# Patient Record
Sex: Male | Born: 1945
Health system: Southern US, Community
[De-identification: ages and names within clinical notes are randomized; demographics above are authoritative.]

## PROBLEM LIST (undated history)

## (undated) DIAGNOSIS — L409 Psoriasis, unspecified: Secondary | ICD-10-CM

## (undated) DIAGNOSIS — J449 Chronic obstructive pulmonary disease, unspecified: Secondary | ICD-10-CM

## (undated) DIAGNOSIS — E785 Hyperlipidemia, unspecified: Secondary | ICD-10-CM

## (undated) DIAGNOSIS — R0609 Other forms of dyspnea: Secondary | ICD-10-CM

## (undated) DIAGNOSIS — E119 Type 2 diabetes mellitus without complications: Secondary | ICD-10-CM

## (undated) DIAGNOSIS — I509 Heart failure, unspecified: Secondary | ICD-10-CM

## (undated) DIAGNOSIS — K219 Gastro-esophageal reflux disease without esophagitis: Secondary | ICD-10-CM

## (undated) DIAGNOSIS — I447 Left bundle-branch block, unspecified: Secondary | ICD-10-CM

## (undated) DIAGNOSIS — I219 Acute myocardial infarction, unspecified: Secondary | ICD-10-CM

## (undated) DIAGNOSIS — I10 Essential (primary) hypertension: Secondary | ICD-10-CM

## (undated) DIAGNOSIS — R0600 Dyspnea, unspecified: Secondary | ICD-10-CM

## (undated) DIAGNOSIS — I4719 Other supraventricular tachycardia: Secondary | ICD-10-CM

## (undated) DIAGNOSIS — I82409 Acute embolism and thrombosis of unspecified deep veins of unspecified lower extremity: Secondary | ICD-10-CM

## (undated) DIAGNOSIS — I2510 Atherosclerotic heart disease of native coronary artery without angina pectoris: Secondary | ICD-10-CM

## (undated) DIAGNOSIS — I471 Supraventricular tachycardia: Secondary | ICD-10-CM

## (undated) DIAGNOSIS — G4730 Sleep apnea, unspecified: Secondary | ICD-10-CM

## (undated) DIAGNOSIS — I442 Atrioventricular block, complete: Secondary | ICD-10-CM

## (undated) DIAGNOSIS — I429 Cardiomyopathy, unspecified: Secondary | ICD-10-CM

## (undated) HISTORY — PX: PACEMAKER INSERTION: SHX728

## (undated) HISTORY — DX: Cardiomyopathy, unspecified: I42.9

## (undated) HISTORY — PX: REFRACTIVE SURGERY: SHX103

## (undated) HISTORY — DX: Other supraventricular tachycardia: I47.19

## (undated) HISTORY — PX: CATARACT EXTRACTION: SUR2

## (undated) HISTORY — DX: Dyspnea, unspecified: R06.00

## (undated) HISTORY — PX: CORONARY ANGIOPLASTY WITH STENT PLACEMENT: SHX49

## (undated) HISTORY — DX: Other forms of dyspnea: R06.09

## (undated) HISTORY — DX: Left bundle-branch block, unspecified: I44.7

## (undated) HISTORY — DX: Hyperlipidemia, unspecified: E78.5

## (undated) HISTORY — DX: Atrioventricular block, complete: I44.2

## (undated) HISTORY — DX: Supraventricular tachycardia: I47.1

---

## 1999-07-03 ENCOUNTER — Ambulatory Visit (HOSPITAL_COMMUNITY): Admission: RE | Admit: 1999-07-03 | Discharge: 1999-07-03 | Payer: Self-pay | Admitting: Cardiology

## 2005-05-25 ENCOUNTER — Inpatient Hospital Stay (HOSPITAL_COMMUNITY): Admission: EM | Admit: 2005-05-25 | Discharge: 2005-05-28 | Payer: Self-pay | Admitting: *Deleted

## 2005-06-23 ENCOUNTER — Ambulatory Visit: Payer: Self-pay | Admitting: Cardiovascular Disease

## 2005-06-23 ENCOUNTER — Inpatient Hospital Stay (HOSPITAL_COMMUNITY): Admission: EM | Admit: 2005-06-23 | Discharge: 2005-06-25 | Payer: Self-pay | Admitting: Emergency Medicine

## 2005-07-21 ENCOUNTER — Encounter: Admission: RE | Admit: 2005-07-21 | Discharge: 2005-07-21 | Payer: Self-pay | Admitting: *Deleted

## 2005-08-13 ENCOUNTER — Ambulatory Visit: Payer: Self-pay | Admitting: Cardiology

## 2005-10-21 ENCOUNTER — Inpatient Hospital Stay (HOSPITAL_COMMUNITY): Admission: EM | Admit: 2005-10-21 | Discharge: 2005-10-24 | Payer: Self-pay | Admitting: Emergency Medicine

## 2005-10-26 HISTORY — PX: CORONARY ARTERY BYPASS GRAFT: SHX141

## 2005-10-29 ENCOUNTER — Ambulatory Visit (HOSPITAL_COMMUNITY): Admission: RE | Admit: 2005-10-29 | Discharge: 2005-10-29 | Payer: Self-pay | Admitting: *Deleted

## 2005-11-19 ENCOUNTER — Ambulatory Visit: Payer: Self-pay | Admitting: Cardiology

## 2005-12-20 ENCOUNTER — Inpatient Hospital Stay (HOSPITAL_COMMUNITY): Admission: EM | Admit: 2005-12-20 | Discharge: 2005-12-28 | Payer: Self-pay | Admitting: Emergency Medicine

## 2005-12-24 ENCOUNTER — Ambulatory Visit: Payer: Self-pay | Admitting: Infectious Diseases

## 2005-12-24 DIAGNOSIS — I82409 Acute embolism and thrombosis of unspecified deep veins of unspecified lower extremity: Secondary | ICD-10-CM

## 2005-12-24 HISTORY — DX: Acute embolism and thrombosis of unspecified deep veins of unspecified lower extremity: I82.409

## 2006-01-08 ENCOUNTER — Ambulatory Visit (HOSPITAL_COMMUNITY): Admission: RE | Admit: 2006-01-08 | Discharge: 2006-01-08 | Payer: Self-pay | Admitting: Orthopedic Surgery

## 2006-02-19 ENCOUNTER — Ambulatory Visit: Payer: Self-pay | Admitting: Cardiology

## 2006-02-26 ENCOUNTER — Encounter (HOSPITAL_COMMUNITY): Admission: RE | Admit: 2006-02-26 | Discharge: 2006-05-27 | Payer: Self-pay | Admitting: *Deleted

## 2006-04-16 ENCOUNTER — Inpatient Hospital Stay (HOSPITAL_COMMUNITY): Admission: EM | Admit: 2006-04-16 | Discharge: 2006-04-20 | Payer: Self-pay | Admitting: Emergency Medicine

## 2006-04-29 ENCOUNTER — Ambulatory Visit: Payer: Self-pay | Admitting: Internal Medicine

## 2006-04-29 ENCOUNTER — Ambulatory Visit (HOSPITAL_BASED_OUTPATIENT_CLINIC_OR_DEPARTMENT_OTHER): Admission: RE | Admit: 2006-04-29 | Discharge: 2006-04-29 | Payer: Self-pay | Admitting: Internal Medicine

## 2006-05-09 ENCOUNTER — Ambulatory Visit: Payer: Self-pay | Admitting: Internal Medicine

## 2006-05-20 ENCOUNTER — Ambulatory Visit: Payer: Self-pay | Admitting: Internal Medicine

## 2006-06-21 ENCOUNTER — Ambulatory Visit: Payer: Self-pay | Admitting: Internal Medicine

## 2006-07-05 ENCOUNTER — Inpatient Hospital Stay (HOSPITAL_COMMUNITY): Admission: EM | Admit: 2006-07-05 | Discharge: 2006-07-13 | Payer: Self-pay | Admitting: Emergency Medicine

## 2006-07-06 ENCOUNTER — Encounter: Payer: Self-pay | Admitting: Vascular Surgery

## 2006-07-07 ENCOUNTER — Encounter: Payer: Self-pay | Admitting: Vascular Surgery

## 2006-08-18 ENCOUNTER — Inpatient Hospital Stay (HOSPITAL_COMMUNITY): Admission: AD | Admit: 2006-08-18 | Discharge: 2006-08-20 | Payer: Self-pay | Admitting: Cardiology

## 2006-08-31 ENCOUNTER — Encounter: Admission: RE | Admit: 2006-08-31 | Discharge: 2006-08-31 | Payer: Self-pay | Admitting: Surgery

## 2008-07-27 ENCOUNTER — Encounter: Payer: Self-pay | Admitting: Internal Medicine

## 2008-08-10 ENCOUNTER — Encounter: Payer: Self-pay | Admitting: Internal Medicine

## 2008-08-24 DIAGNOSIS — G4733 Obstructive sleep apnea (adult) (pediatric): Secondary | ICD-10-CM

## 2008-08-24 DIAGNOSIS — R0989 Other specified symptoms and signs involving the circulatory and respiratory systems: Secondary | ICD-10-CM

## 2008-08-24 DIAGNOSIS — E119 Type 2 diabetes mellitus without complications: Secondary | ICD-10-CM | POA: Insufficient documentation

## 2008-08-24 DIAGNOSIS — R0609 Other forms of dyspnea: Secondary | ICD-10-CM | POA: Insufficient documentation

## 2008-08-24 DIAGNOSIS — J449 Chronic obstructive pulmonary disease, unspecified: Secondary | ICD-10-CM

## 2008-08-27 ENCOUNTER — Ambulatory Visit: Payer: Self-pay | Admitting: Internal Medicine

## 2008-08-30 ENCOUNTER — Ambulatory Visit: Payer: Self-pay | Admitting: Internal Medicine

## 2009-01-01 ENCOUNTER — Encounter: Payer: Self-pay | Admitting: Internal Medicine

## 2009-09-16 ENCOUNTER — Encounter: Payer: Self-pay | Admitting: Internal Medicine

## 2009-10-02 ENCOUNTER — Encounter: Payer: Self-pay | Admitting: Internal Medicine

## 2009-10-02 HISTORY — PX: NM MYOCAR PERF WALL MOTION: HXRAD629

## 2009-12-02 ENCOUNTER — Inpatient Hospital Stay (HOSPITAL_COMMUNITY): Admission: EM | Admit: 2009-12-02 | Discharge: 2009-12-04 | Payer: Self-pay | Admitting: Emergency Medicine

## 2009-12-11 ENCOUNTER — Encounter: Admission: RE | Admit: 2009-12-11 | Discharge: 2009-12-11 | Payer: Self-pay | Admitting: Cardiovascular Disease

## 2010-01-03 ENCOUNTER — Encounter: Payer: Self-pay | Admitting: Internal Medicine

## 2010-01-10 ENCOUNTER — Encounter: Payer: Self-pay | Admitting: Internal Medicine

## 2010-01-10 HISTORY — PX: US ECHOCARDIOGRAPHY: HXRAD669

## 2010-04-09 ENCOUNTER — Encounter: Payer: Self-pay | Admitting: Internal Medicine

## 2010-05-27 ENCOUNTER — Telehealth: Payer: Self-pay | Admitting: Internal Medicine

## 2010-07-01 ENCOUNTER — Encounter: Payer: Self-pay | Admitting: Internal Medicine

## 2010-11-16 ENCOUNTER — Encounter: Payer: Self-pay | Admitting: Cardiology

## 2010-11-16 ENCOUNTER — Encounter: Payer: Self-pay | Admitting: *Deleted

## 2010-11-25 NOTE — Letter (Signed)
Summary: LMN Update / [facility]  LMN Update / [facility]   Imported By: Lennie Odor 05/29/2010 12:26:32  _____________________________________________________________________  External Attachment:    Type:   Image     Comment:   External Document

## 2010-11-25 NOTE — Progress Notes (Signed)
Summary: Patient D/C'd CPAP  Phone Note Other Incoming   Summary of Call: Christoper Allegra reports patient is not using CPAP now and refused to autotitrate. I will have it dc'd. Initial call taken by: Waymon Budge MD,  May 27, 2010 10:07 PM

## 2010-11-25 NOTE — Letter (Signed)
Summary: [facility]  [facility]   Imported By: Sherian Rein 01/27/2010 12:14:44  _____________________________________________________________________  External Attachment:    Type:   Image     Comment:   External Document

## 2010-11-25 NOTE — Letter (Signed)
Summary: [facility]  [facility]   Imported By: Sherian Rein 08/01/2010 14:12:20  _____________________________________________________________________  External Attachment:    Type:   Image     Comment:   External Document

## 2010-11-25 NOTE — Letter (Signed)
Summary: LMN/[facility]  LMN/[facility]   Imported By: Lester Copemish 04/15/2010 08:50:53  _____________________________________________________________________  External Attachment:    Type:   Image     Comment:   External Document

## 2011-01-14 LAB — DIFFERENTIAL
Basophils Absolute: 0 10*3/uL (ref 0.0–0.1)
Basophils Relative: 0 % (ref 0–1)
Eosinophils Absolute: 0.2 10*3/uL (ref 0.0–0.7)
Eosinophils Relative: 2 % (ref 0–5)
Lymphocytes Relative: 16 % (ref 12–46)
Lymphs Abs: 1.4 10*3/uL (ref 0.7–4.0)
Monocytes Absolute: 0.8 10*3/uL (ref 0.1–1.0)
Neutro Abs: 6.8 10*3/uL (ref 1.7–7.7)
Neutrophils Relative %: 73 % (ref 43–77)

## 2011-01-14 LAB — CBC
HCT: 37.5 % — ABNORMAL LOW (ref 39.0–52.0)
HCT: 40.2 % (ref 39.0–52.0)
HCT: 44.3 % (ref 39.0–52.0)
Hemoglobin: 13 g/dL (ref 13.0–17.0)
Hemoglobin: 13.9 g/dL (ref 13.0–17.0)
Hemoglobin: 15.3 g/dL (ref 13.0–17.0)
MCHC: 34.7 g/dL (ref 30.0–36.0)
MCV: 94.7 fL (ref 78.0–100.0)
MCV: 95.6 fL (ref 78.0–100.0)
Platelets: 307 10*3/uL (ref 150–400)
Platelets: 332 10*3/uL (ref 150–400)
Platelets: 340 10*3/uL (ref 150–400)
RBC: 4.24 MIL/uL (ref 4.22–5.81)
RDW: 13 % (ref 11.5–15.5)
RDW: 13.2 % (ref 11.5–15.5)
WBC: 8.9 10*3/uL (ref 4.0–10.5)

## 2011-01-14 LAB — MAGNESIUM: Magnesium: 1.5 mg/dL (ref 1.5–2.5)

## 2011-01-14 LAB — LIPID PANEL
Cholesterol: 125 mg/dL (ref 0–200)
LDL Cholesterol: 69 mg/dL (ref 0–99)
Total CHOL/HDL Ratio: 3.5 RATIO
Triglycerides: 100 mg/dL (ref <150)

## 2011-01-14 LAB — CARDIAC PANEL(CRET KIN+CKTOT+MB+TROPI)
CK, MB: 0.5 ng/mL (ref 0.3–4.0)
CK, MB: 0.6 ng/mL (ref 0.3–4.0)
Total CK: 45 U/L (ref 7–232)

## 2011-01-14 LAB — COMPREHENSIVE METABOLIC PANEL
ALT: 31 U/L (ref 0–53)
Albumin: 3.7 g/dL (ref 3.5–5.2)
BUN: 10 mg/dL (ref 6–23)
Calcium: 9.2 mg/dL (ref 8.4–10.5)
Creatinine, Ser: 0.7 mg/dL (ref 0.4–1.5)
Glucose, Bld: 226 mg/dL — ABNORMAL HIGH (ref 70–99)
Total Protein: 7.4 g/dL (ref 6.0–8.3)

## 2011-01-14 LAB — BASIC METABOLIC PANEL
Chloride: 103 mEq/L (ref 96–112)
Creatinine, Ser: 0.85 mg/dL (ref 0.4–1.5)
GFR calc Af Amer: >60 mL/min (ref >60)
Potassium: 3.7 mEq/L (ref 3.5–5.1)
Sodium: 138 mEq/L (ref 135–145)

## 2011-01-14 LAB — CK TOTAL AND CKMB (NOT AT ARMC)
CK, MB: 0.6 ng/mL (ref 0.3–4.0)
Relative Index: INVALID (ref 0.0–2.5)
Total CK: 47 U/L (ref 7–232)

## 2011-01-14 LAB — APTT: aPTT: 27 seconds (ref 24–37)

## 2011-01-14 LAB — PROTIME-INR
INR: 0.98 (ref 0.00–1.49)
Prothrombin Time: 12.9 seconds (ref 11.6–15.2)

## 2011-01-14 LAB — GLUCOSE, CAPILLARY: Glucose-Capillary: 157 mg/dL — ABNORMAL HIGH (ref 70–99)

## 2011-01-14 LAB — HEMOGLOBIN A1C: Mean Plasma Glucose: 192 mg/dL

## 2011-01-14 LAB — HEPARIN LEVEL (UNFRACTIONATED): Heparin Unfractionated: 0.22 IU/mL — ABNORMAL LOW (ref 0.30–0.70)

## 2011-01-14 LAB — TSH: TSH: 0.561 u[IU]/mL (ref 0.350–4.500)

## 2011-03-13 NOTE — Discharge Summary (Signed)
NAMECARMELO, REIDEL NO.:  [id number]   MEDICAL RECORD NO.:  [id number]          PATIENT TYPE:  INP   LOCATION:  2038                         FACILITY:  [facility]   PHYSICIAN:  Madaline Savage, M.D.DATE OF BIRTH:  1946-10-12   DATE OF ADMISSION:  08/18/2006  DATE OF DISCHARGE:  08/20/2006                               DISCHARGE SUMMARY   DISCHARGE DIAGNOSES:  1. Paroxysmal atrial flutter with symptomatic chest pain and shortness      of breath.      a.     Now in sinus rhythm at discharge.  2. Myocardial infarction.  3. History of coronary artery bypass grafting, July 09, 2006.  4. Anticoagulation, Lovenox/Coumadin crossover on discharge.  5. Diabetes mellitus, type 2.  6. Chronic obstructive pulmonary disease.  7. History of hematoma to his legs, stable.  8. Hyperlipidemia.  9. Tachycardia with atrial flutter, resolved.  10.Ascending thoracic aortic aneurysm.   DISCHARGE CONDITION:  Improved.   PROCEDURES:  None.   DISCHARGE MEDICATIONS:  1. Lovenox 80 mg every 12 hours.  2. Coumadin 7.5 mg October 20, 2006.  Saturday 5 mg, Sunday 7.5 mg,      and Monday 5 mg.  3. Cardizem CD 120 mg daily.  4. Enteric-coated aspirin 81 mg daily.  5. Toprol XL 100 daily.  6. Benazepril 5 mg 1 daily.  7. Lipitor 40 mg daily.  8. Glucophage 1 twice a day.  Do not begin until August 21, 2006.  9. Protonix 40 mg daily.  10.Oxycodone 5 mg as before.   DISCHARGE INSTRUCTIONS:  1. Low-fat, low-salt diabetic diet.  2. Increase activity slowly.  3. Stop Plavix.  4. Have lab work done on Monday and call our office if he has not      heard from [place] by 3 p.m.  5. Follow up with Dr. Jenne Campus at September 06, 2006, at 4 p.m.   HISTORY OF PRESENT ILLNESS:  A 65 year old white married male with  history of coronary disease and multiple stents, last stent being in  August of 2006.  Then, 3 caths with cutting balloon angioplasty in  December of 2006 and again in June of  2007.  His last cath was September  27th with increased coronary disease not amenable to angioplasty.  He  underwent bypass grafting x3 with sequential saphenous vein graft to the  first and second OM and circ and saphenous vein graft to the PLA at the  circ.  The patient had called our office on August 18, 2006, with  ongoing chest pain different than his incisional pain.  His pain  medication had not helped.  He took two nitro 5 minutes apart, which  relieved most of his discomfort.  He came into the office and was found  to be in A-flutter with heart rate of 125.  No other acute EKG changes.  We admitted him to [facility] on Heparin and Coumadin and placed him  on Cardizem IV initially.  Plavix was discontinued with the addition of  Coumadin.   PAST MEDICAL HISTORY:  1. Cardiac,  as stated.  2. Hypertension.  3. Hyperlipidemia.  4. COPD.  5. Diabetes mellitus, type 2.  6. History of hematoma to his leg.  7. History of Baker cyst.   ALLERGIES:  NO KNOWN ALLERGIES.   OUTPATIENT MEDICATIONS:  1. Aspirin 325 mg.  2. Toprol 100.  3. Benazepril 5.  4. Lipitor 40.  5. Glucophage 500 b.i.d.  6. Plavix 75 daily.  7. Protonix 40.  8. Oxycodone p.r.n.   FAMILY HISTORY:  See H&P.   SOCIAL HISTORY:  See H&P.   REVIEW OF SYSTEMS:  See H&P.   PHYSICAL EXAMINATION AT DISCHARGE:  Blood pressure 103/67, pulse 94,  respirations 20, temperature 97.4, oxygen saturation on room air 93%.  HEART:  Regular rate and rhythm.  Lungs were clear.  ABDOMEN:  Soft, nontender.  Positive bowel sounds.  EXTREMITIES:  Without edema.   LABORATORY DATA:  Hemoglobin 13, hematocrit 38, WBC 8.8, platelets 450;  these remained stable.  Neutrophils 63, lymphs of 24, monos 9, eos 3,  basos 0.  Pro time 13.3, INR of 1, PTT 28.  D-dimer is 0.34.   At discharge, pro time 16, INR of 1.2, heparin 0.37.   CHEMISTRY:  Sodium 141, potassium 4, chloride 105, CO2 of 27, glucose  112, BUN 14, creatinine 0.8,  calcium 9.6, total protein 6.9, albumin  3.9, AST 23, ALT 27, alkaline phosphatase 77, total bilirubin of 0.7,  magnesium 1.8.   Glyco hemoglobin 5.7.   CK 47 and 41, MB 0.9 and 1.0, and Troponin I is 0.10 x2.   TSH 1.23, blood cultures no growth for 5 days.   EKG:  Sinus rhythm with a left bundle branch block.  EKG from the  office:  He was in A-flutter 2:1 block.  No acute changes from previous  tracing and followup EKG converted to sinus rhythm.  Sinus rhythm and  left bundle branch block on October 25th.   CT of the chest for rule out PE was negative for pulmonary embolism.  Minimal scattered peripheral densities with a tree and bud appearance  which may represent very mild alveolitis, mild pericardial effusion, 4.5-  cm ascending thoracic aortic aneurysm.   HOSPITAL COURSE:  Mr. Groninger was admitted from the office secondary to  chest pain and 2:1 atrial flutter, admitted to telemetry and he was  placed on IV heparin and IV Cardizem.  Labs were evaluated, and then he  was started on Coumadin.  Plavix was discontinued.  He continued to  improve in the hospital and converted to sinus rhythm.  By October 26th,  he was stable and ready for discharge home.  He was discharged on subcu  Lovenox as an outpatient and Coumadin.      Darcella Gasman. Annie Paras, N.P.    ______________________________  Madaline Savage, M.D.    LRI/MEDQ  D:  10/01/2006  T:  10/02/2006  Job:  [id number]   cc:   Osvaldo Shipper. Spruill, M.D.  Darlin Priestly, MD

## 2011-03-13 NOTE — Cardiovascular Report (Signed)
NAMEJAKYLE, PETRUCELLI NO.:  [id number]   MEDICAL RECORD NO.:  [id number]          PATIENT TYPE:  INP   LOCATION:  2006                         FACILITY:  [facility]   PHYSICIAN:  Darlin Priestly, MD  DATE OF BIRTH:  1946/09/05   DATE OF PROCEDURE:  07/07/2006  DATE OF DISCHARGE:                              CARDIAC CATHETERIZATION   PROCEDURES:  1. Left heart catheterization.  2. Coronary angiography.  3. Left ventriculogram.   ATTENDING PHYSICIAN:  Darlin Priestly, M.D.   COMPLICATIONS:  None.   INDICATIONS:  Mr. Carstens is a 65 year old male, a patient of mine, with a  history of acute lateral wall MI, May 25, 2005, with subsequent placement  of five CYPHER stents, two in the first OM, two in the second OM, and one in  the distal AV groove circumflex.  He did have in-stent restenosis of the  ostial portion of the first OM with subsequent placement of a sixth CYPHER  stent by Dr. Daphene Jaeger on June 24, 2005.  He has had intermittent episodes  of recurrent chest pain with recurrent episodes of in-stent restenosis of  the first OM and AV groove circumflex.  He is now readmitted again with  unstable angina and scheduled to undergo coronary bypass surgery.  He is now  brought for re-look at his coronaries to see if LAD grafting is required.   DESCRIPTION OF PROCEDURE:  After obtaining informed written consent, the  patient was brought to the cardiac cath lab.  Right and left groins were  shaved, prepped and draped in the usual sterile fashion.  ECG monitor  established.  Using modified Seldinger technique a #6-French arterial sheath  inserted into the right femoral artery.  A 6-French diagnostic catheter was  then used to perform diagnostic angiography.   1. The left main is a large vessel with no significant disease.  2. The LAD is a medium to large vessel which coursed over the apex with      one diagonal branch.  The LAD has mild 30-40% proximal mid vessel      narrowing but no high grade stenosis.  3. The first diagonal is a small vessel with no significant disease.  4. The left circumflex is a large vessel which is dominant.  It gives rise      to three obtuse marginal branches as well as a PDA.  The AV groove      circumflex is noted to have 70% mid vessel lesion between the first and      second OMs.  There is a stent noted in the distal AV groove circumflex      which appears to be widely patent.  5. The first OM is a large vessel with overlapping stents in its proximal      segment.  __________  ostial stenosis.  There is a 30% distal stenosis      after the distal aspect of the stent.  6. The second OM is a medium to large vessel with overlapping stents in      its  mid segment with 40% proximal narrowing.  7. The PDA is a medium sized vessel with no significant disease.  8. The right coronary artery is a small nondominant vessel with 95% mid      vessel lesion.   Hand injection of the LV reveals an estimated EF of approximately 60%.   HEMODYNAMICS:  1. Systemic arterial pressure 120/64.  2. LV systemic pressure 120/80, LVEDP of 15.   CONCLUSION:  1. Significant 2-vessel coronary artery disease.  2. Normal left ventricular systolic function.      Darlin Priestly, MD  Electronically Signed     RHM/MEDQ  D:  07/07/2006  T:  07/07/2006  Job:  [id number]

## 2011-03-13 NOTE — Discharge Summary (Signed)
[name], [name] NO.:  [id number]   MEDICAL RECORD NO.:  [id number]          PATIENT TYPE:  INP   LOCATION:  6524                         FACILITY:  [facility]   PHYSICIAN:  Darlin Priestly, MD  DATE OF BIRTH:  03-04-1946   DATE OF ADMISSION:  10/21/2005  DATE OF DISCHARGE:                                 DISCHARGE SUMMARY   DISCHARGE DIAGNOSES:  1.  Subendocardial myocardial infarction this admission by troponin.  2.  Coronary disease, in-stent restenosis of the obtuse marginal-2 treated      with cutting balloon this admission.  3.  Coronary disease with a history of subendocardial myocardial infarction      in July 2006 treated with five stents; two to the obtuse marginal-1, two      to the obtuse marginal-2 and intervention to the atrioventricular      branch. Restenosis of the obtuse marginal-1 treated with percutaneous      coronary intervention in August 2006.  4.  Left bundle branch block.  5.  Hypertension.  6.  Hyperlipidemia.  7.  Syncope on admission, secondary to hypotension from nitroglycerin.  8.  Dyslipidemia.   HOSPITAL COURSE:  The patient is a 65 year old male followed by Dr. Jenne Campus  with history of coronary disease. In July 2006, he had an SEMI and underwent  two Cypher stents to the OM-2, two Cypher stents to the OM-1 and an AV  branch Cypher stent. The OM-1 was again intervened on by Dr. Tresa Endo August  2006 after an abnormal Cardiolite study. The patient has continue to have  chest pain off and on and his medications have been increased as an  outpatient. On the day of admission, he had taken two nitroglycerin for  chest pain and got into a car. He became nauseated, diaphoretic and blacked  out. He was seen in emergency room. He was admitted from the ER and given  one dose of Lovenox and set up for catheterization the next day. We backed  off on some of his medications that had previously been increased for  angina. His troponins were  positive at 0.15. He underwent catheterization on  October 22, 2005 by Dr. Jenne Campus. This revealed a 95% mid small nondominant  RCA, normal left main, normal LAD with a 40% mid-narrowing, patent OM-1  stent sites with a 50% proximal narrowing and 70% stenosis in the distal OM-  2 stent. The AV branch was patent. The patient's EF was 40-45%. The patient  underwent OM-2 cutting balloon for in-stent restenosis. His troponins went  to 0.25. His medications have been adjusted and Ranexa has been added. He  will be discharged later today pending follow-up troponin. We did hold off  on some of his medications including his Norvasc because of hypotension.   DISCHARGE MEDICATIONS:  1.  Lipitor 20 milligrams a day.  2.  Ranexa 500 milligrams a day.  3.  Coated aspirin daily.  4.  Toprol XL 100 milligrams a day.  5.  Aldactone 12.5 milligrams a day.  6.  Imdur 60 milligrams a day.  7.  Triton study drug as directed.  8.  Nitroglycerin sublingual p.r.n.  9.  Protonix 40 milligrams a day.  10. Plavix 75 milligrams a day.   LABS:  White count 11.5, hemoglobin 13.1, hematocrit 36.6, platelets  347,000. Sodium 137, potassium 2.9, BUN 11, creatinine 0.9. ALT is slightly  elevated at 62, AST is normal at 34. CKs were negative and troponin the  evening after his intervention was 0.25. CK-MBs were negative x3. TSH 1.08.  Digoxin level 0.5. D-dimer was ordered on the 27th but there were no  results. Chest x-ray shows no acute process. UA showed some proteinuria,  otherwise unremarkable. Coags were normal. His INR is 1.0.   DISPOSITION:  The patient is discharged in stable condition and will follow-  up with Dr. Jenne Campus. We had to change his Zocor to Lipitor because of the  addition of Ranexa which cannot be taken with Zocor. Dr. Domingo Sep brought up  that the patient may be considered for EECP as an outpatient.      Abelino Derrick, P.A.      Darlin Priestly, MD  Electronically Signed     LKK/MEDQ  D:  10/23/2005  T:  10/23/2005  Job:  [id number]   cc:   Darlin Priestly, MD  Fax: [phone]
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
## 2011-03-13 NOTE — Op Note (Signed)
[name], [name] NO.:  [id number]   MEDICAL RECORD NO.:  [id number]          PATIENT TYPE:  INP   LOCATION:  2899                         FACILITY:  [facility]   PHYSICIAN:  Harvie Junior, M.D.   DATE OF BIRTH:  03-13-1946   DATE OF PROCEDURE:  01/08/2006  DATE OF DISCHARGE:  01/08/2006                                 OPERATIVE REPORT   A 65 year old male in Orthopedic Surgery service.   DATE OF SURGERY:  January 08, 2006.   DATE OF DICTATION:  January 08, 2006.   PREOPERATIVE DIAGNOSIS:  Complex organized hematoma posterior calf, right.   POSTOPERATIVE DIAGNOSIS:  Complex organized hematoma posterior calf, right.   PRINCIPAL PROCEDURE:  Irrigation and debridement of complex organized  hematoma with debridement of skin, muscle, fascia and deep elements.   SURGEON:  Harvie Junior, M.D.   ASSISTANT:  Marshia Ly, P.A.   ANESTHESIA:  General.   BRIEF HISTORY:  [name] is a 65 year old male with a long history of having  had pain in his right calf.  He ultimately was treated with some blood  thinners and because of this developed a hematoma in his right calf.  He had  this aspirated under fluoroscopy, and that seemed to help, but was still  continuing to complain of pain and whatnot in the area of the calf.  He was  unable to walk and was having great difficulty with that and because of that  pain and inability to walk we talked about treatment options but ultimately  felt that drainage of this mass was appropriate and he was brought to the  operating room for this procedure.   PROCEDURE:  The patient was brought to the operating room and after adequate  anesthesia was obtained using general anesthetic, the patient was placed on  the operating table, the right leg was prepped and draped in the usual  sterile fashion.  Following this, a small incision was made over the  musculotendinous junction of the gastroc which had been identified with the  MRI and  the preoperative plan.  At this point, complex fluid began to drain  out of the wound in the subcutaneous area.  We then made the wound large  enough to get a sucker in and used the sucker up onto this area.  We used a  hemostat to bring out the clots and clotted tissue and it really drained out  a significant amount out of the back area of the calf.  We used a pulse  lavage irrigation followed by Annamarie Major syringe, also put some peroxide in  there to try to sclerose any bleeding, given the fact that he had been on  heparin therapy.  After this,  a flattened Blake drain was placed with a grenade and the wound was closed  around this drain, a sterile compressive dressing was applied at this point  and the patient was taken to the recovery room, was noted to be in  satisfactory condition.  Estimated blood loss for the procedure was  __________.  Harvie Junior, M.D.  Electronically Signed     JLG/MEDQ  D:  01/08/2006  T:  01/10/2006  Job:  [id number]

## 2011-03-13 NOTE — Cardiovascular Report (Signed)
[name] NO.:  [id number]   MEDICAL RECORD NO.:  [id number]          PATIENT TYPE:  INP   LOCATION:  6524                         FACILITY:  [facility]   PHYSICIAN:  Nicki Guadalajara, M.D.     DATE OF BIRTH:  07-06-1946   DATE OF PROCEDURE:  06/24/2005  DATE OF DISCHARGE:                              CARDIAC CATHETERIZATION   HISTORY:  [name] is a 65 year old male with history of  hyperlipidemia, hypertension who had presented to [facility] emergency room  on May 25, 2005 with 3 days of stuttering chest pain. He underwent acute  catheterization by Dr. Jenne Campus and underwent insertion of five stents placed  in the circumflex territory with 2.5 x 13 and 2.5 x 8 mm stent placed in the  first OM branch, tandem 2.5 x 23 and 2.5 x 13 mm Cypher stents in the mid-  portion of the second obtuse marginal branch as, well as a 2.5 x 18 mm stent  in the distal AV groove circumflex. He also was found to have a 95% stenosis  in a small nondominant right coronary artery in its mid-segment. Initial  ejection fraction was depressed at 30% with global hypokinesis. The patient  had done well. He was enrolled in a Trident study comparing  __________  versus study drug. Yesterday he had gone to Trident study follow-up visit  and complained of slight chest pain and diaphoresis. We were contacted, and  ultimately he was seen and admitted for further evaluation.   PROCEDURE:  After premedication with Valium 5 milligrams intravenous, the  patient prepped and draped in usual fashion. His right femoral artery was  punctured anteriorly and a 5-French sheath was inserted. Diagnostic  catheterization was done with 5-French Judkins five left coronary catheter  and a 5-French Judkins four right coronary catheter. 200 mcg of  intracoronary nitroglycerin was administered down the left coronary  circulation. Pigtail catheter was used for biplane cine left  ventriculography.  Also 200  micrograms of IC nitroglycerin were administered  down the right coronary artery.  With a demonstration of focal 70-80%  narrowing at the ostium of the OM1 vessel just proximal to the previously  placed stents, the decision was made to attempt stenting this vessel. A  venous line was inserted. Double bolus Integrilin and 4000 units of weight  adjusted heparinization was administered. A 5-French sheath was upgraded to  6-French sheath. Again, the patient was on Trident study drug. A 6-French  4.5 guide was used for the intervention and a __________ wire was advanced  down the OM1 vessel. A 2.5 x 18-mm drug-eluting Cypher stent was then  carefully inserted and placed just at the ostium of the OM1 vessel with care  not to place the stent in the proximal circumflex but to insure covering of  this small, narrowed segment. Post-stent dilatation was done utilizing a  2.75 x 12 mm Quantum balloon. Dilatation was done up to 275 mm in the entire  stented segment including the previously placed OM1 stents. IC nitroglycerin  was again administered. Scout angiography confirmed  an excellent  angiographic result. The  patient tolerated the procedure well returned to  his room in satisfactory condition.   HEMODYNAMIC DATA:  Central aortic pressure was 94/56, left ventricular  pressure is 94/9.   ANGIOGRAPHIC DATA:  The left main coronary artery was normal and bifurcated  into LAD and left circumflex system.   The LAD was free of significant disease and gave rise to a proximal diagonal  vessel. There is mild luminal irregularity in the mid segment of  approximately 20-30%.   The circumflex vessel was a large dominant vessel. The first OM vessel had a  focal 80% stenosis at its ostium prior to the site of the previously placed  two tandem stents. There is no restenosis within the stented segment.  Following IC nitroglycerin, again this ostial stenosis appeared to be 70-  80%. The second marginal vessel  had widely placed patent tandem stents in  its mid-segment. There is a 40% narrowing in the AV groove circumflex just  after the OM to take off. The distal stent in the distal circumflex before  several posterolateral branches was widely patent.   The right coronary was a small nondominant vessel that had 90% mid-distal  stenosis in the region of an anterior marginal branch.   Biplane cine left ventriculography revealed significantly improved LV  function with an ejection fraction of approximately 50-55% with minimal mid  inferior hypocontractility.  On the LAO projection, contractility was  vigorous and ejection fraction was at least 55%.   Following percutaneous coronary intervention of the OM1 vessel, the focal  80% ostial OM1 stenosis which was just proximal to the previously placed  stents following stenting with a 2.5 x 8 mm Cypher stent post dilated with a  2.75 mm Quantum balloon was reduced to 0%. There is no evidence for  dissection. There was TIMI III flow.   IMPRESSION:  1.  Significantly improved global left ventricular function with ejection      fraction of approximately 55% with minimal residual mid inferior      hypocontractility in this patient with recent documentation of EF of 30%      by catheterization May 25, 2005.  2.  Minimal luminal irregularity of the mid LAD.  3.  Widely patent five previously placed stents with two stents in the OM1      vessel, two stents in the mid OM2 vessel and one stent in the distal AV      groove circumflex but with evidence for focal 80% narrowing at the      ostium of the OM1 vessel just before the stented segment, 40% narrowing      in the AV groove circumflex immediately after the OM2 take off.  4.  No change in previously noted 90% stenosis in the mid portion of a small      nondominant right coronary artery.  5.  Successful primary stenting of the ostium of the OM1 vessel with     placement of an additional 2.5 x 8 mm Cypher  stent post dilated to 2.75      done with double bolus Integrilin and weight adjusted heparinization.      (The patient is enrolled in the Trident trial comparing __________      versus study drug.           ______________________________  Nicki Guadalajara, M.D.     TK/MEDQ  D:  06/24/2005  T:  06/24/2005  Job:  [id number]   cc:  Darlin Priestly, MD  [address], [address]  [place]  [place] [address]  Fax: [phone]

## 2011-03-13 NOTE — Assessment & Plan Note (Signed)
[facility]                               PULMONARY OFFICE NOTE   NAME:[name], [name]                         MRN:          [id number]  DATE:05/20/2006                            DOB:          Jul 04, 1946    DATE OF VISIT:  May 20, 2006.   PROBLEMS:  1.  Chronic obstructive pulmonary disease with asthmatic bronchitis.  2.  Coronary disease/myocardial infarction/bypass/stent.  3.  Exertional dyspnea.  4.  Obstructive sleep apnea.   HISTORY:  A sleep study on April 29, 2006, demonstrated moderate obstructive  apnea with an apnea/hypopnea index of 20.4 per hour.  Mean oxygen saturation  through the study was 93% with desaturation nadir of 85%.   MEDICATIONS:  1.  Lotensin 5 mg times 1/2.  2.  Isosorbide 90 mg.  3.  Plavix 75 mg.  4.  Ranexa 1000 mg b.i.d.  5.  Aspirin.  6.  Protonix 40 mg.  7.  Toprol XL 100 mg.  8.  Metformin 500 mg b.i.d.  9.  Sular.  10. Norvasc.  11. Nitroglycerin.  12. Combivent inhaler.   NO MEDICATION ALLERGY.   He is concerned that his insurance is running out, as he applies for  disability.  He does not think that Spiriva offered any advantage over  Combivent so he chooses not to use it.  Exertional dyspnea is noted mainly  if he tries to hurry, bend over or climb on hills.   OBJECTIVE:  Weight 181 pounds, BP 136/82, pulse rate 80, room air saturation  97%.  He seems alert now.  Breathing is unlabored.  Lung fields are very quiet.  Heart sounds are regular without murmur.  There is no edema.   IMPRESSION:  1.  Moderately severe obstructive sleep apnea with an index of 20.4 per      hour.  2.  Mild to moderate obstructive airways disease with a FEV-1/FVC ratio of      59% and some response to bronchodilator.  Normal diffusion.   PLAN:  1.  We have refilled Combivent and he is going to use that up to q.i.d.      p.r.n., saving Spiriva in case really needed.  2.  CPAP auto titration.  3.  Encouraged walking  for endurance.  He may become a candidate for      pulmonary rehabilitation, but he is worried about his insurance coverage      for now.  4.  With his CPAP auto titration, we are going to allow temazepam 15 mg h.s.      p.r.n., for occasional use only, with appropriate discussion done.  5.  Schedule return in one month, earlier p.r.n.                                   Clinton D. Maple Hudson, MD, [facility], FACP   CDY/MedQ  DD:  05/20/2006  DT:  05/20/2006  Job #:  [id number]   cc:   Wesley Priestly, MD  [name]  Wesley Must, MD

## 2011-03-13 NOTE — Cardiovascular Report (Signed)
NAMEJULIANO, [name] NO.:  [id number]   MEDICAL RECORD NO.:  [id number]          PATIENT TYPE:  INP   LOCATION:  6533                         FACILITY:  [facility]   PHYSICIAN:  Wesley Priestly, MD  DATE OF BIRTH:  [birth year]   DATE OF PROCEDURE:  04/19/2006  DATE OF DISCHARGE:                              CARDIAC CATHETERIZATION   PROCEDURE:  1.  Left heart catheterization.  2.  Coronary angiography.  3.  Left ventriculogram.  4.  OM1 - ostial -  Cutting balloon angioplasty  Placed percutaneous transluminal coronary balloon angioplasty.  1.  AV groove of the circumflex - mid -  Percutaneous transluminal coronary balloon angioplasty.   SURGEON:  Wesley Priestly, MD   COMPLICATIONS:  None.   INDICATIONS:  [name] is a 65 year old male patient of Dr. Donia Ray  and Dr. Lenise Ray with a history of hypertension, hyperlipidemia,  history of COPD, acute lateral wall MI in July of 2006 with subsequent  placement of two CYPHER stents in his first OM, two CYPHER stents in the  second OM, and a fifth CYPHER stent in the distal AV groove of circumflex.  He did have recurrent chest pain on repeat catheterization by Dr. Daphene Ray  in August with placement of a sixth CYPHER stent at the ostium of the first  OM.  His last catheterization in December revealed narrowing after the  second OM stent with placement of a third CYPHER stent in the second OM.  He  recently has complained of crescendo angina and is now brought for repeat  catheterization to re-assess his coronary anatomy.   DESCRIPTION OF PROCEDURE:  After informed consent, the patient was brought  to the cardiac cath lab, and the right groin was shaved, prepped, and draped  in the usual sterile fashion.  ECG monitoring was established.  Using  modified Seldinger technique, a #6-French arterial sheath was inserted into  the right femoral artery.  A 6 French diagnostic catheter was used to  perform  diagnostic angiography.   The left main is a large vessel with no significant disease.   The LAD is a medium to large vessel which coursed the apex, gave rise to one  diagonal branch.  The LAD has a 40-50% kinking segment in its early mid-  segment with mild, but diffuse, 30-40% disease.   The first diagonal is a small vessel with no significant disease.   The left circumflex is a large vessel which is dominant.  It gives to two  obtuse marginal branches as well as a PDA.  The AV groove of circumflex is  noted to have a long 70% lesion extending across the first and second obtuse  marginal.  The CYPHER stent in the distal AV groove of circumflex appears to  be widely patent.   The first OM is a medium vessel with three overlapping stents in its ostium  and proximal portion.  There is an 80% ostial lesion with haziness.  The  remainder of the stents appear to be widely patent.   The second OM  is a large vessel with two overlapping stents in its early mid-  segment which are widely patent.   The PDA and posterolateral branch originate from the AV groove of circumflex  and have no significant disease.   The right coronary artery is a small nondominant vessel with a 90% mid-  vessel lesion.   Left ventriculogram reveals a moderately depressed EF of 40% with  anterolateral hypokinesis.   HEMODYNAMICS:  Systemic arterial pressure 106/56, LV systemic pressure  107/4, LVDP of 8.   INTERVENTIONAL PROCEDURE:  OM1 - ostial:  Following diagnostic angiography,  #6-French JL4 guiding catheter was successfully engaged in the left coronary  ostium.  Next, a 0.014 [place] guide wire was used to cross the ostial  first OM lesion and positioned in the distal lumen without difficulty.  Next, a 2.5 x 6-mm cutting balloon was then tracked across the ostial  portion of the OM and three subsequent inflations to a maximum of 6 atm was  performed for a total of approximately 1 minute 20 seconds.   Followup  angiogram revealed no evidence of dissection or thrombus with a good luminal  gain; however, there did appear to be some plaque shift into the AV groove  of circumflex.  We then placed a second 0.014 [employer] Soft guide wire into the  PDA.  Next, we took a 3.5 x 15-mm Maverick balloon into the mid-AV groove of  circumflex across the first and second OMs.  Two inflations to a maximum of  12 atm was performed for a total of 48 seconds.  Followup angiogram revealed  good luminal gain with no evidence of dissection or thrombus, though there  was now some haziness again noted in the ostial portion of the first OM.  We  then placed a Maverick 2.5 x 15-mm balloon across the ostial portion of the  first OM and the 3.5 x 15-mm balloon in the AV groove of circumflex and  kissing-balloon inflation was then performed, both to 6 atm for a total of  23 seconds.  Followup angiogram revealed no evidence of dissection or  thrombus with TIMI 3 flow to the distal vessel.  IV Angiomax was used  throughout the case.   Final orthogonal angiograms revealed approximately 20% residual stenosis in  the ostium of the first OM and 30% residual stenosis in the AV groove of  circumflex.  With this, we elected to conclude the procedure.  All balloons,  wires , and catheters were removed.  Hemostatic sheaths were sewn in place,  and the patient was transferred back to the ward in stable condition.   CONCLUSIONS:  1.  Successful cutting balloon angioplasty with adjunct percutaneous      transluminal coronary balloon angioplasty with first obtuse marginal end-      stent re-stenosis.  2.  Successful percutaneous transluminal coronary balloon angioplasty of the      mid-AV groove of circumflex.  3.  Kissing balloon angioplasty of the AV groove of circumflex and first OM.  4.  Mild to moderately depressed LV systolic function.  5.  Adjunct use of Angiomax infusion.      Wesley Priestly, MD Electronically  Signed     RHM/MEDQ  D:  04/19/2006  T:  04/20/2006  Job:  [id number]   cc:   [name]. Spruill, M.D.  Fax: [phone]

## 2011-03-13 NOTE — Cardiovascular Report (Signed)
[name], [name] NO.:  [id number]   MEDICAL RECORD NO.:  [id number]            PATIENT TYPE:   LOCATION:                                 FACILITY:   PHYSICIAN:  Darlin Priestly, MD       DATE OF BIRTH:   DATE OF PROCEDURE:  10/22/2005  DATE OF DISCHARGE:                              CARDIAC CATHETERIZATION   PROCEDURES:  1.  Left heart catheterization.  2.  Coronary angiography.  3.  Left ventriculogram.  4.  Placement obtuse marginal-2 mid in-stent restenosis-cutting balloon      angioplasty.   COMPLICATIONS:  None.   INDICATIONS:  [name] is a 65 year old male with a history of acute lateral  wall MI in July 2006 with subsequent placement of 2 stents in the first OM,  2 stents in the second OM and a stent in the distal AV groove circumflex. At  that time, he was noted to have a 95% lesion in the mid nondominant RCA  which we treated medically. He had a repeat catheterization on June 24, 2005 by Dr. Daphene Jaeger suggesting ostial disease of the first OM and had a  sixth cypher stent placed in the ostial portion of the first OM. He has  continued to have intermittent chest pain almost daily but he has returned  to work. He was readmitted to the hospital on October 21, 2005 with  recurrent chest pain and questionable syncopal episodes after taking 2  sublingual nitro at the same time. He was noted to have mild elevation in  his troponin 0.15. He is now brought for cardiac catheterization to reassess  his cardiac anatomy.   DESCRIPTION OF PROCEDURE:  After giving informed written consent, the  patient was brought to the cardiac cath lab. The right and left groins were  shaved, prepped and draped in the usual sterile fashion. Anesthesia  monitoring was established. Using the modified Seldinger technique, a #6  introducer sheath was inserted in the right femoral artery. A #6 French  diagnostic catheter was used to perform the diagnostic  angiography.   The left main was a large vessel with no evidence of any disease.   The LAD is a large vessel which coursed the apex with 1 diagonal branch. The  LAD is noted to have mild 30%-40% mid vessel narrowing with the suggestion  of kinking in the midsegment. The first diagonal is a medium size vessel  with no evidence of any disease.   The left circumflex is a large vessel which is dominant and gives off to the  2 obtuse marginal branches as well as the PDA. The distal AV groove  circumflex stent appears to be widely patent.   The first OM is a medium size vessel with 3 overlapping stents in its ostial  and proximal portion. There appears to be approximately 50% ostial residual  disease. The remainder of the diagonal appears to be patent.   The second OM is a large vessel with 2 overlapping stents in its midsegment.  There appears to be approximately  60%-70% in-stent restenosis in the more  distal of the 2 stents with TIMI-3 flow throughout the vessel.   The PDA is a medium size vessel with no evidence of disease.   The right coronary artery is a small nondominant vessel with a 95% distal  lesion.   The left ventriculogram reveals an EF of approximately 40%-45% with mild to  moderate lateral hypokinesis.   HEMODYNAMIC SYSTEM:  Right arterial pressure 105/67, LV system pressure  102/60, LVP of 7.   INTERVENTIONAL PROCEDURE:  OM-2-in-stent restenosis: Following diagnostic  angiography, a #6 [place] JL #4.5 Guidant catheter was cross-engaged in the  left coronary artery stent. Next, a 0.14I-Q guidewire was advanced via the  Guidant catheter and positioned in the distal OM without difficulty. Next, a  2.5 x 6 mm cutting balloon was advanced into the in-stent restenosis segment  and 5 subsequent inflations to a maximum of 8 atmospheres for a total of  approximately 3 minutes and 15 seconds were performed. Follow-up angiogram  revealed good luminal gain with TIMI-3 flow in the  distal flow. This balloon  was then exchanged for a 2.75 x 6 mm cutter. Two inflations were performed  to a maximum of 8 atmospheres for a total of approximately 1 minute and 25  seconds. Follow-up angiogram revealed good luminal gain with no evidence of  dissection or thrombus. IV Angiomax was used throughout the case.   Final orthogonal angiograms revealed approximately 20% residual stenosis  within the previously in-stent restenosis area with TIMI-3 flow distal  vessel. At this point, we elected to conclude. Hemostatic sheaths were sewn  in place and the patient was transferred back to the ward in stable  condition.   1.  Successful cutting balloon angioplasty of the in-stent restenotic      segment of the second obtuse marginal.  2.  Mildly depressed EF with wall motion abnormality as noted above.  3.  __________ of Angiomax.      Darlin Priestly, MD  Electronically Signed     RHM/MEDQ  D:  10/22/2005  T:  10/22/2005  Job:  [id number]

## 2011-03-13 NOTE — Assessment & Plan Note (Signed)
[facility]                               PULMONARY OFFICE NOTE   NAME:Fontan, THOMAS MABRY                         MRN:          [id number]  DATE:06/21/2006                            DOB:          Nov 05, 1945    PROBLEMS:  1. Chronic obstructive pulmonary disease with asthmatic bronchitis.  2. Coronary disease/myocardial infarction/bypass/stent.  3. Exertional dyspnea.  4. Obstructive sleep apnea.  5. Diabetes.   HISTORY:  He has been trying an auto-titration machine; but, despite the  humidifier, he is having significant sneezing and nasal congestion.  His  wife says as long as he wears it, he does no snore, seems to be breathing  more comfortably and seems to be sleeping more restfully.  He agrees he  feels better rested.  He has no past history of seasonal rhinitis.  He has  not tried the temazepam we gave as a sleep aid while adjusting the CPAP.  There has been no acute breathing events.  Combivent is used very  occasionally.  There has been little cough, no wheeze.  He thinks his heart  has been acting up, meaning more frequent angina and more rapid heart beat  this weekend.  I have asked him to go ahead and contact his cardiologist  now, although he currently is pain free.   MEDICATIONS:  Lotensin __________  times 5 mg, isosorbide 90 mg, Plavix 75  mg, Ranexa 1000 mg b.i.d., aspirin, Protonix, Toprol-XL 100 mg, metformin  500 mg b.i.d., Sular, Norvasc 5 mg, CPAP, nitroglycerin, Combivent inhaler.  No medication allergy.  He is titrated to 8 CWP.   OBJECTIVE:  VITAL SIGNS:  Weight 183 pounds, BP 170/94, pulse rate of 87,  room air saturation 97%.  GENERAL:  He is blowing his nose with clear nasal discharge.  Breathing is  otherwise quiet.  CARDIAC:  Pulse is regular without murmur or gallop.  HEENT:  Conjunctivae are noninjected.   IMPRESSION:  1. Obstructive sleep apnea should be fairly well controlled at 8 CWP.  2. This is probably  vasomotor rhinitis and we ought to be able to tune the      CPAP and humidifier to make it more comfortable.  New-onset seasonal      allergic rhinitis would be unlikely.  3. Coronary disease with angina for cardiology followup.   PLAN:  1. We are switching him to a fixed CPAP at 8 CWP with reassessment of mask      and supplies.  2. Sample Astelin 1 to each nostril at bedtime p.r.n.  3. Schedule return with me 1 month, earlier p.r.n.  4. Early cardiology followup.                                   Clinton D. Maple Hudson, MD, FCCP, FACP   CDY/MedQ  DD:  06/21/2006  DT:  06/22/2006  Job #:  [id number]   cc:   Osvaldo Shipper. Shana Chute, MD  Darlin Priestly, MD
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
## 2011-03-13 NOTE — Op Note (Signed)
[name], FARQUHAR NO.:  [id number]   MEDICAL RECORD NO.:  [id number]          PATIENT TYPE:  INP   LOCATION:  2031                         FACILITY:  [facility]   PHYSICIAN:  Evelene Croon, M.D.     DATE OF BIRTH:  11-17-1945   DATE OF PROCEDURE:  07/09/2006  DATE OF DISCHARGE:                                 OPERATIVE REPORT   PREOPERATIVE DIAGNOSIS:  Severe three-vessel coronary disease, with in-stent  restenosis status post prior percutaneous interventions on the left  circumflex coronary artery.   POSTOPERATIVE DIAGNOSIS:  Severe three-vessel coronary disease, with in-  stent restenosis status post prior percutaneous interventions on the left  circumflex coronary artery.   OPERATIVE PROCEDURE:  Median sternotomy, extracorporeal circulation,  coronary bypass graft surgery x3 using a sequential saphenous vein graft to  the first and second obtuse marginal branches of the left circumflex  coronary artery, and a saphenous vein graft to the posterolateral branch of  the left circumflex coronary artery.  Endoscopic vein harvesting from the right leg.   ATTENDING SURGEON:  Evelene Croon, M. D.   ASSISTANT:  Sheliah Plane, M. D.   SECOND ASSISTANT:  Gershon Crane, PA-C.   ANESTHESIA:  General endotracheal.   CLINICAL HISTORY:  This patient is a 65 year old gentleman with multiple  cardiac risk factors and a history of coronary disease who has undergone  multiple percutaneous interventions over the past 2 years.  He had a history  of an acute MI in July, 2006 with subsequent placement of 5 Cypher stents at  that time.  He had 2 in the first obtuse marginal, 2 in the second obtuse  marginal, and 1 in the AV groove left circumflex.  He had a repeat  catheterization that suggested ostial disease in the first obtuse marginal  and received a sixth Cypher stent at that time.  He has had recurrent  episodes of in-stent restenosis of the second obtuse marginal stent  and  underwent several percutaneous interventions for that.  His last  catheterization was performed in June, 2007 which showed the in-stent  restenosis of the ostial portion of the first obtuse marginal as well as  diffuse disease in the mid AV groove left circumflex.  On April 19, 2006 he  underwent cutting balloon angioplasty with PTCA of the first obtuse marginal  in-stent restenosis and PTCA of the mid AV groove left circumflex.  The  patient said that his symptoms of chest pain and shortness of breath  resolved for a couple weeks but by the middle of July he began having  recurrent episodes of substernal chest pain, shortness of breath as well as  decreased energy level.  He has been treated with maximal medical therapy  but was admitted on July 05, 2006 with an 8 day history of daily  substernal chest pressure and shortness of breath with minimal exertion.  He  ruled out for myocardial infarction.  I was asked to see the patient and  felt that coronary artery bypass graft surgery was indicated for likely  restenosis.  He underwent repeat  catheterization which showed high-grade in-  stent restenosis of the first obtuse marginal branch.  There was still about  70% proximal left circumflex stenosis before the first and second marginal  branches.  There also appeared to be some ostial disease in the second  obtuse marginal branch.  The stent in the mid-AV groove left circumflex was  widely patent.  The left circumflex terminated as several smaller  posterolateral branches and a small posterior descending branch.  The right  coronary artery was a nondominant vessel that essentially gave off one small  acute marginal branch that had about 95% stenosis.  The left anterior  descending coronary artery had some mild proximal regularity but no  significant stenosis.  Cardiology felt there was probably about 40% proximal  stenosis but I think it was probably even less than that.  Left  ventricular  function was well-preserved.  After review of the catheterization and  examination of the patient I felt it would be best to proceed with coronary  bypass surgery.  I did not feel that the LAD would require grafting because  there was no significant stenosis there and a left internal mammary graft  would likely become atretic due to the competitive flow.  I felt that the  first and second obtuse marginal branches should be grafted as well as the  distal left circumflex that supplied the inferior wall.  I did not use the  left internal mammary graft for the left circumflex coronary artery because  I thought it was very possible that he may need that in the future to graft  his LAD.  I did not see a benefit of using the right internal mammary as a  free graft in this patient.  His superficial palmar arches were abnormal and  therefore radiographs were not used.   I discussed the operative procedure with the patient and his family  including alternatives, benefits, and risks including bleeding, blood  transfusion, infection, stroke, myocardial infarction, graft failure, and  death.  Also discussed the importance of maximum cardiac risk factor  reduction.  He understood and agreed to proceed.   OPERATIVE PROCEDURE:  The patient was taken to the operating room and placed  on the table in supine position.  After induction of general endotracheal  anesthesia a Foley catheter was placed in the bladder using sterile  technique.  Then the chest, abdomen and both lower extremities were prepped  and draped in the usual sterile manner.  The chest was entered through a  median sternotomy incision.  Examination of the heart showed good  ventricular contractility.  The ascending aorta had no palpable plaques in  it.   At the same time a segment of greater saphenous vein was harvested from the right leg using endoscopic vein harvest technique.  This vein was of medium  size and good  quality.   Then the patient was heparinized and when an adequate  __________  was  achieved the distal ascending aorta was cannulated using a 20-French aortic  cannula for arterial inflow.  Venous outflow was achieved using a two-stage  venous cannula through the right atrial appendage and antegrade cardioplegia  and vent cannula was inserted in the aortic root.   The patient was placed on cardiopulmonary bypass and the distal coronary was  identified.  The LAD was a large vessel that had some segmental plaque  present in it throughout its course.  There was a small diagonal branch that  also had segmental  plaque throughout its course.  The first obtuse marginal  was a large vessel that was visible proximally but then became  intramyocardial after the proximal portion.  It was located just as it  entered the muscle where it was__________  also the graft.  The second  obtuse marginal was also a large vessel.  Proximally the vessel was on the  surface of the heart but in this area there were stents present.  I followed  this vessel down into the muscle where it was lying fairly deep.  The area  beyond the stents appeared soft and without significant disease.  I could  not see the mid or distal portion of these vessels at all because they  remained intramyocardial.  The left circumflex terminated in a couple of  small posterolateral branches and a small posterior descending branch.  One  of the posterolateral branches appeared large enough to graft although it  was a small vessel.  The posterior descending branch was lying beneath a  large posterior descending vein over most of its course and was also  diffusely diseased and I did not feel this would be a safe vessel to graft  due to this combination of factors.   Then the aorta was cross clamped and 500 mL of cold blood antegrade  cardioplegia was administered in the aortic root with quick arrest the  heart.  Systemic hypothermia to 28  degrees centigrade and topical  hypothermic __________  was used.  A temperature probe was placed in the  septum and insulating pad in the pericardium.   The first distal anastomosis was performed of the first obtuse marginal  branch.  The internal diameter of this vessel was about 2 mm.  The conduit  used was a segment of greater saphenous vein and anastomosis performed in a  sequential side-to-side manner using continuous 7-0 Prolene suture.  Flow  was noted through the graft and was excellent.   The second distal anastomosis was performed to the second marginal branch.  The internal diameter was also about 2 mm.  Conduit used was the same  segment of greater saphenous vein and anastomosis performed in a sequential  end-to-side manner using continuous 7-0 Prolene suture.  Flow was noted  through the graft and was excellent.  Then another dose of cardioplegia was given down the vein graft and the aortic root.   The third distal anastomosis was performed to the posterolateral branch of  the left circumflex coronary artery.  The internal diameter of this vessel  was about 1.5 mm.  Conduit used was a second segment of greater saphenous  vein and the anastomosis performed in a end-to-side manner using continuous  7-0 Prolene suture.  Flow admitted through the graft and was excellent.   Then with the cross clamp in place the 2 proximal vein graft anastomosis  were performed of the aortic root in end-to-side manner using continuous 6-0  Prolene suture.  The patient was then rewarmed to 37 degrees centigrade.  The cross clamp was then removed with time of 59 minutes.  There was  spontaneous return of sinus rhythm.   The proximal and distal anastomoses appeared hemostatic __________ while the  grafts satisfactory.  A graft marker was placed around the proximal  anastomosis.  Two temporary left ventricular and right atrial pacing wires  placed and brought out through the skin.   When the  patient rewarmed to 37 degrees centigrade he was weaned from  cardiopulmonary bypass on no  inotropic agents.  Total bypass time 74  minutes.  Cardiac function appeared excellent with a cardiac output of 4.5  liters per minute.  Protamine was given and the venous and aortic cannulas  were removed without difficulty.  Hemostasis was achieved.  Three __________  chest tubes were placed with 2 in  the post pericardium and one in the  anterior mediastinum.  The pericardium was loosely reapproximated over the  heart.  The sternum was closed with #6 stainless steel wires.  Fascia was  closed with continuous #1 Vicryl suture.  Subcutaneous tissue was closed  with continuous 2-0 Vicryl and the skin with 3-0 Vicryl subcuticular  closure.  The lower extremity vein harvest site was closed in layers in a  similar manner.  The sponge, needle and instrument counts correct according  to the scrub nurse.  Dry sterile dressings were applied over the incisions  around the chest tubes which were hooked to Pleur-Evac suction.  The patient  remained hemodynamically stable and was transported to the SICU in guarded  but stable condition.      Evelene Croon, M.D.  Electronically Signed     BB/MEDQ  D:  07/09/2006  T:  07/11/2006  Job:  [id number]   cc:   Evelene Croon, M.D.  Delman Cheadle, MD  Cardiac Cath Lab

## 2011-03-13 NOTE — Discharge Summary (Signed)
NAMEBARRIE, WALE NO.:  [id number]   MEDICAL RECORD NO.:  [id number]          PATIENT TYPE:  INP   LOCATION:  6524                         FACILITY:  [facility]   PHYSICIAN:  Darlin Priestly, MD  DATE OF BIRTH:  03/09/1946   DATE OF ADMISSION:  06/23/2005  DATE OF DISCHARGE:  06/25/2005                                 DISCHARGE SUMMARY   DISCHARGE DIAGNOSES:  1.  Episodic shortness of breath.  2.  Nonstable angina, resolved.  3.  Coronary artery disease with placement of stent to left circumflex      obtuse marginal.  4.  History of myocardial infarction July 2006 with two stents placed.  5.  Ischemic cardiomyopathy with ejection fraction 41%.  6.  Hypertension.  7.  Hyperlipidemia.   CONDITION ON DISCHARGE:  Improved.   PROCEDURE:  June 24, 2005 combined left heart catheterization by Dr.  Tresa Endo.  June 24, 2005 percutaneous transluminal coronary angioplasty with  stent deployment with Cypher drug-eluting stent to the left circumflex  obtuse marginal 1, more proximal to the previous obtuse marginal 1 stent.   DISCHARGE MEDICATIONS:  1.  Enteric coated aspirin 81 mg p.o. daily.  2.  Zocor 40 mg p.o. daily.  3.  Triton study drug twice a day as before.  4.  Protonix 40 mg daily.  5.  Coreg 6.25 mg only take one-half tablet twice daily.  6.  Benazepril 5 mg, one-half tablet daily.  7.  Lanoxin 0.125 mg one daily.  8.  Spironolactone 25 mg daily.  9.  Nitroglycerin sublingual PRN.   DISCHARGE INSTRUCTIONS:  1.  No work.  2.  Low fat, low salt diet.  3.  Wash catheterization site with soap and water.  Call if any bleeding,      swelling or drainage.  4.  Follow up with Dr. Jenne Campus July 10, 2005 at 2:15 P.M.   HISTORY OF PRESENT ILLNESS:  The patient is a 65 year old white married male  who came to the office by referral from [employer] while at their  office where he was being evaluated for his Triton study drug.  The patient  complained of being weak, got diaphoretic and pale and with slight chest  pressure.  He was given oxygen and felt better after a period of time.  He  was then sent to Dr. Mikey Bussing office.   Cardiac history includes admission May 25, 2005 with myocardial infarction,  urgent catheterization, one stent X2 obtuse marginal stent X2, AV groove  stent. He had residual 95% nondominant right coronary artery and 40% left  anterior descending stenosis.  The patient had stated after he was  discharged he had intermittent episodes of shortness of breath, weakness,  heaviness and tightness always with exertion.  This was progressively  getting worse and occurs daily at least once.  Cardiolite done June 18, 2005 with inferior and inferoseptal and inferoapical scar, mild anteroseptal  ischemia.   PAST MEDICAL HISTORY:  Coronary disease as stated previously.  Ischemic  cardiomyopathy with ejection fraction 41%.  Hypertension and hyperlipidemia.  OUTPATIENT MEDICATIONS:  Aspirin, Zocor, Protonix, Coreg, Benazepril,  Lanoxin, spironolactone, nitroglycerin and Triton study drug.   ALLERGIES:  No allergies.   FAMILY AND SOCIAL HISTORY, REVIEW OF SYSTEMS:  See history and physical.   DISCHARGE PHYSICAL EXAMINATION:  VITAL SIGNS:  Blood pressure 120/58, pulse  84, respirations 22, temperature 97.7.  Oxygen saturation room air 94%.  GENERAL APPEARANCE:  Alert, oriented, male in no acute distress.  HEART:  S1, S2.  Regular rate and rhythm.  No murmurs or gallops.  ABDOMEN:  Soft, nontender.  LUNGS: Clear.  EXTREMITIES:  Right groin catheterization site stable.   LABORATORY DATA:  Hemoglobin 15.4, hematocrit 43.8, white blood cell count  9.4, platelet count 355,000, neutrophils 55, lymphocytes 29, monocytes 10,  eosinophils 6, basophils 1.  Those remained stable.   Pro Time 12.5 on admission with INR 0.9, PTT 28.  Heparin level 0.26 on  heparin infusion.  Chemistries with sodium 139, potassium 3.9,  chloride 105,  cO2 25, glucose 113, BUN 15, creatinine 0.9, calcium 1.5, total protein 7,  albumin 3.8, AST 39, ALT 71, ALP 97, total bilirubin 0.6, magnesium 2.2.  Prior to discharge the liver function tests were down to AST 33, ALT 66, ALP  78.   Cardiac enzymes were negative.  CK's ranged 50, 38, 44, 83.  MB's ranged 1.0  to 1.5 and troponin-I 0.19, to 0.20.   Lipid panel with cholesterol 193, triglycerides 276, HDL 36, LDL 102.   HOSPITAL COURSE:  Mr. Gleed was admitted by Dr. Jenne Campus from the office on  June 23, 2005 due to dyspnea on exertion, compatible with unstable angina.  He was brought in, troponin's were positive, and due to elevated liver  function tests his Statin was held. He underwent a catheterization and a new  stent was placed in his obtuse marginal  1 proximal to his previous stents.  By the next day the patient was stable  and ready for discharge home.  We restarted his Statin as his liver function  tests had come back down. He will follow up with Dr. Jenne Campus as an  outpatient.      Darcella Gasman. Valarie Merino      Darlin Priestly, MD  Electronically Signed    LRI/MEDQ  D:  08/27/2005  T:  08/27/2005  Job:  [id number]

## 2011-03-13 NOTE — Discharge Summary (Signed)
[name], [name] NO.:  [id number]   MEDICAL RECORD NO.:  [id number]          PATIENT TYPE:  INP   LOCATION:  2031                         FACILITY:  [facility]   PHYSICIAN:  Evelene Croon, M.D.     DATE OF BIRTH:  [birth year]   DATE OF ADMISSION:  07/05/2006  DATE OF DISCHARGE:  07/13/2006                                 DISCHARGE SUMMARY   CARDIOLOGIST:  Dr. Jenne Campus.   ADMISSION DIAGNOSIS:  Three-vessel coronary artery disease with in-stent  restenosis status post prior percutaneous interventions in the left  circumflex coronary artery.   DISCHARGE DIAGNOSES:  1. Severe three-vessel coronary disease status post multiple percutaneous      interventions, status post CABG on July 09, 2006.  2. Hypertension.  3. Hyperlipidemia.  4. Adult onset diabetes mellitus type 2.  5. Chronic obstructive pulmonary disease.  6. Status post multiple cardiac catheterizations with percutaneous      intervention.  7. History of prior methicillin-resistant Staphylococcus aureus bacteremia      that was treated.  8. Postop volume overload.   CONSULTANT:  None.   PROCEDURES:  1. On July 07, 2006 the patient underwent left heart catheterization,      coronary angiography, left ventriculogram by Dr. Lenise Herald.  2. On July 09, 2006 the patient underwent median sternotomy,      extracorporeal circulation, coronary artery bypass graft surgery x2      using a sequential saphenous vein graft to the first and second obtuse      marginal branch of left circumflex coronary artery, saphenous vein      graft to the posterolateral branch of the left circumflex coronary      artery. Endoscopic vein harvesting from the right leg by Dr. Evelene Croon.   HISTORY OF PRESENT ILLNESS:  This is a 65 year old gentleman with multiple  cardiac risk factors and a history of coronary disease who has been admitted  on multiple percutaneous interventions over the past few years.  The patient  has a history of an acute MI in July 2006 with subsequent placement of five  Cypher stents at that time. He had two in the first obtuse marginal, two in  the second obtuse marginal, one in the AV groove left circumflex. The  patient had repeat catheterization __________  ostial disease in the first  obtuse marginal RCA  __________ at that time. The patient had recurrent  episodes of in-stent restenosis of the second obtuse marginal stent and  underwent several percutaneous interventions for that. The patient's left  catheterization was performed June 2007 which showed the in-stent restenosis  of the ostial portion of the first obtuse marginal __________ , diffuse  disease in the mid AV groove left circumflex. On April 19, 2006 the patient  underwent cutting balloon angioplasty with PTCA of the first obtuse  marginal, in-stent restenosis, and PTCA of the mid AV groove left  circumflex.   The patient says that his symptoms of chest pain and shortness of breath  resolved for a couple of weeks, but  by the middle of July he began having  recurrent episodes of substernal chest pain, shortness of breath as well as  decrease in energy level. The patient has been treated with maximal medical  therapy, but was admitted on May 04, 2006 with an 8-day history of daily  substernal chest pressure and shortness of breath with minimal exertion. The  patient was ruled out myocardial infarction. It was best thought that the  patient undergo coronary artery bypass graft surgery.   The patient underwent repeat catheterization which showed a high-grade in-  stent restenosis of the first obtuse marginal branch. There was still about  70% proximal left circumflex stenosis for the first and second marginal  branches. There also appeared to be some ostial disease in the second obtuse  marginal branch. The stent in the mid AV groove left circumflex did not  appear patent. The left circumflex  terminated at several smaller  posterolateral branches of the small posterior descending branch. The right  coronary artery was a nondominant vessel that essentially gave off one small  acute marginal branch that had about 95% stenosis. The LAD coronary had some  mild proximal and right irregularity but no significant stenosis. It was  thought that the patient undergo coronary artery bypass grafting. The risks  and benefits are explained to the patient great detail, and he has agreed to  proceed.   HOSPITAL COURSE:  The patient was admitted to the hospital on July 05, 2006 for increase of shortness of breath and substernal chest pain for eight  days. Cardiology admitted the patient, and he underwent cardiac  catheterization on July 07, 2006 without any complications. Dr. Laneta Simmers  saw the patient on July 06, 2006, and he scheduled the patient's  surgery for July 09, 2006. Prior to surgery the patient had ABIs which  were within normal limits bilaterally. The patient had normal radial on the  right and left. On carotid Doppler there was no evidence of significant  internal carotid artery stenosis, and vertebral artery flow was antegrade.  Prior to surgery the patient was maintained on a heparin drip and continued  his Plavix. His vitals remained stable prior to surgery, and he remained  chest pain free.   The patient underwent CABG x3 on July 09, 2006 without any  complications. The patient was weaned from the vent on July 09, 2006  without any complications. Postoperatively the patient's goal is to continue  his incentive spirometry. The O2 sat in 90s on room air.   The patient had some postop anemia. However, he did not require any blood  transfusions, and his hemoglobin, hematocrit have raised appropriately. The  patient did have an increase in volume overload postoperatively. He is being  diuresed with Lasix, and he is responding appropriately. The  patient was transferred to 2000 on postop day #1.   The patient has been having an increase in his blood pressure and some sinus  tachycardia. Cardiology is following the patient. They increased his  Lopressor on July 12, 2006. The patient does have a history of diabetes  mellitus. He was maintained on __________  sliding-scale insulin. His blood  sugars were well controlled. The patient's Lantus was discontinued on  July 12, 2006. He was started on p.o. metformin 500 mg b.i.d. The  patient is ambulating well with cardiac rehab with a steady gait. The  patient did not have a bowel movement yet on postop day #3. He was given  some milk of magnesia.  He is tolerating his diet well.   PHYSICAL EXAMINATION:  VITAL SIGNS:  Shows afebrile, vital signs stable.  Heart rate 127, blood pressure 116 systolic, weight 540, preop weight 180  pounds.  CARDIAC:  Regular rate and rhythm.  LUNGS:  Decreased breath sounds on the right.  ABDOMEN:  Fairly distended, positive bowel sounds, nontender. Incisions  clear, dry, intact.  EXTREMITIES:  Plus edema right greater than left.   The patient will be discharged home in the next 1-2 days if his heart rate  and blood pressure remain stable. He remains hemodynamically stable.   DISCHARGE DISPOSITION:  The patient will be discharged home in good  condition without any home health.   MEDICATIONS:  1. Aspirin 325 mg p.o. daily.  2. Toprol XL 100 mg p.o. daily.  3. __________  5 mg p.o. daily.  4. Lipitor 40 mg p.o. daily.  5. Glucophage 500 mg p.o. b.i.d.  6. Plavix 75 mg p.o. daily.  7. Lasix 40 mg p.o. daily x7 days.  8. KCl 20 mEq daily x7 days.  9. Protonix 40 mg p.o. daily.  10.Oxycodone 5 mg 1-2 tabs every four hours p.r.n.   DISCHARGE INSTRUCTIONS:  No driving or lifting greater than 10 pounds. The  patient is to ambulate 3-4 times daily and increase activity as tolerated.  He is instructed to continue his breathing exercises. Follow a  low fat, low  salt diabetic diet. He may shower and clean his wounds with mild soap and  water. Call the office if any wound problems shall arise such as incision  drainage, erythema, temperature greater than 101.5.   FOLLOW UP:  The patient has follow-up appointment with Dr. Laneta Simmers in three  weeks. The office will contact him with the time and date. Prior to seeing  Dr. Laneta Simmers, he will have chest x-ray taken at [facility].  The patient is call Dr. Jenne Campus for an appointment in two weeks. The patient  to follow up with his primary care physician for his diabetes medication and  education.      Constance Holster, [place]      Evelene Croon, M.D.  Electronically Signed    JMW/MEDQ  D:  07/12/2006  T:  07/13/2006  Job:  [id number]   cc:   Darlin Priestly, MD

## 2011-03-13 NOTE — Cardiovascular Report (Signed)
[name], [name] NO.:  [id number]   MEDICAL RECORD NO.:  [id number]          PATIENT TYPE:  INP   LOCATION:  2923                         FACILITY:  [facility]   PHYSICIAN:  Darlin Priestly, MD  DATE OF BIRTH:  [birth year]   DATE OF PROCEDURE:  05/25/2005  DATE OF DISCHARGE:                              CARDIAC CATHETERIZATION   PROCEDURE:  1.  Left heart catheterization.  2.  Coronary angiography.  3.  Left ventriculogram.  4.  Obtuse marginal 1-proximal.  Placement of intracoronary stent.  Obtuse marginal 2-mid.  Placement of intracoronary stent.  AV groove circumflex distal.  Placement of intracoronary stent.   ATTENDING:  Dr. Lenise Herald   COMPLICATIONS:  None.   INDICATIONS:  [name] is a 65 year old male with a history of  hyperlipidemia, hypertension, ongoing tobacco use.  Presented to the ER on  May 25, 2005 with three days of stuttering chest pain.  The patient was  noted to have bundle branch block of unknown chronicity.  His first troponin  was mildly elevated at 2.  He was subsequently started on IV nitroglycerin  and heparin and became pain-free.  He is now referred for cardiac  catheterization to assess his coronary status.   DESCRIPTION OF OPERATION:  After giving informed written consent, patient  brought to the cardiac catheterization laboratory.  Right and left groin  shaved, prepped, and draped in usual sterile fashion.  ECG monitor  established.  Using a modified Seldinger technique, a #6-French arterial  sheath inserted in right femoral artery.  A 6-French diagnostic catheter was  then used to perform diagnostic angiography.   Left main is a large vessel with no significant disease.   The LAD is a large vessel with proximal calcification, gives rise to one  diagonal branch.  The LAD has mild 40% mid vessel narrowing, but no high  grade stenosis.   First diagonal is a medium sized vessel with no significant disease.   Left circumflex is a large vessel which is dominant, gives rise to two  obtuse marginal branches as well as a PDA and posterolateral branch.  The AV  groove circumflex is noted to have 40% lesion after the takeoff of first OM  and 80% distal lesion prior to the takeoff of the PDA and posterolateral  branch.   The first OM is a medium sized vessel with 95% proximal lesion.  The second  OM is a large vessel which bifurcates distally with a long 70% lesion in its  mid segment with a focal 95% mid lesion.   The PDA and posterolateral branch have no significant disease.   The right coronary artery is a small, nondominant vessel with a 95% lesion  in the mid portion.   Left ventriculogram reveals a moderate to severely depressed EF of  approximately 30% with moderate global hypokinesis.  There appears to be  apical akinesis.   HEMODYNAMICS:  Systemic arterial pressure 95/63, LV systemic pressure 90/4,  LVEDP 12.   INTERVENTIONAL PROCEDURE:  OM1 - proximal:  Following diagnostic angiography  a 7-French 3.5  Voda catheter with side holes was __________  engaged in the  left coronary ostium.  Next, a 0.014 Forte marker wire was advanced through  the guiding catheter and used to cross the proximal first OM stenotic  lesion.  Following this a 2.5 x 13 mm CYPHER was then positioned across the  proximal lesion.  This stent was then deployed to 10 atmospheres for a total  of 27 seconds.  Follow-up angiogram revealed step-up at the proximal portion  of the stent but there did not appear to be any obvious dissection.  There  was some haziness in the distal portion of the stent.  This balloon was then  removed and a second CYPHER 2.5 x 8 mm stent was then placed over the distal  end with careful attention to overlap the distal segments.  This stent was  then deployed at 8 atmospheres for 34 seconds.  A second inflation to 8  atmospheres was performed for 23 seconds.  This balloon was then pulled  back  over the overlapping segment and one inflation to 10 atmospheres was  performed for a total of 14 seconds.  Follow-up angiogram revealed no  evidence of dissection or thrombus with TIMI 3 flow in the distal vessel.  The wire was then placed into the distal portion of the second OM and a 2.5  x 23 mm CYPHER was then tracked across the lesion.  This stent was then  deployed to 10 atmospheres for a total of 32 seconds.  A second inflation to  14 atmospheres was performed for 23 seconds.  There did appear to be some  haziness at the proximal portion of the stent.  This balloon was removed and  a second 2.5 x 13 mm CYPHER was then positioned over the proximal portion of  the second OM stent.  Careful attention was used to overlap the distal  segments.  This stent was deployed to 14 atmospheres for a total of 23  seconds.  A second inflation to 14 atmospheres was performed for a total of  20 seconds.  The balloon was then advanced over the overlapping segment and  one additional inflation to 14 atmospheres was performed for 39 seconds.  Follow-up angiogram revealed no evidence of dissection or thrombus with TIMI  3 flow to the distal vessel.  The balloon was then removed and the wire was  then positioned in the PDA.  We then tracked a CYPHER 2.5 x 18 mm stent  across the distal stenotic lesion.  The stent was then deployed to 8  atmospheres for 29 seconds.  A second inflation to 10 atmospheres was  performed for 25 seconds.  The proximal portion of the stent appeared to be  somewhat undersized.  However, I did not want to take the stent balloon any  higher as the distal vessel did not appear to be larger than 2.5.  This  balloon was then removed and a 2.75 x 12 mm Quantum Ranger was then placed  in the proximal portion of the AV groove circumflex and one inflation to 10 atmospheres was performed for 15 seconds.  Follow-up angiogram revealed no  evidence of dissection or thrombus with TIMI 3  flow to the distal vessel.  IV Integrilin was used throughout the case.  __________  heparin given to  maintain the ACT between 200-300.   Final orthogonal angiograms reveal less than 10% residual stenosis in the  OM1, OM2, and AV groove circumflex lesion with TIMI 3 flow  to the distal  vessel.  At this point we elected to conclude the procedure.  The patient  was enrolled in the TRITON study and was randomized to either Plavix or the  TRITON study drug.  The hemostatic sheath was sewn in place and patient was  transferred back to the unit in stable condition.   CONCLUSION:  1.  Successful placement of CYPHER 2.5 x 13, 2.5 x 8 in the proximal first      obtuse marginal stenotic lesion.  2.  Successful placement of a CYPHER 2.5 x 23, 2.5 x 13 in the mid second      obtuse marginal stenotic lesion.  3.  Successful placement of a CYPHER 2.5 x 18 in the distal AV groove      circumflex.  4.  Moderately depressed left ventricular systolic function.  5.  Adjunct use of Integrilin infusion.      Darlin Priestly, MD  Electronically Signed     RHM/MEDQ  D:  05/25/2005  T:  05/25/2005  Job:  [id number]
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
## 2011-03-13 NOTE — Procedures (Signed)
NAME:  JOSHUA, SOULIER NO.:  [id number]   MEDICAL RECORD NO.:  [id number]          PATIENT TYPE:  OUT   LOCATION:  SLEEP CENTER                 FACILITY:  [facility]   PHYSICIAN:  Clinton D. Maple Hudson, M.D. DATE OF BIRTH:  Jan 08, 1946   DATE OF STUDY:  04/29/2006                              NOCTURNAL POLYSOMNOGRAM   REFERRING PHYSICIAN:  Dr. Jetty Duhamel   INDICATIONS FOR STUDY:  Hypersomnia with sleep apnea.   EPWORTH SLEEPINESS SCORE:  14/24, BMI 25.7.  Weight 180 pounds.   HOME MEDICATION:  Toprol XL, Protonix, Atrovent, Glucophage, Plavix,  aspirin, Ranexa, isosorbide, Sular, nitroglycerin, benazepril.   SLEEP ARCHITECTURE:  Total sleep time 312 minutes with sleep efficiency 84%.  Stage I was 8%, stage II 76%, stages III and IV 5%, REM was 11% of total  sleep time.  Sleep latency 36 minutes, REM latency 185 minutes, awake after  sleep onset 30 minutes, arousal index 17.   RESPIRATORY DATA:  Apnea/hypopnea index (AHI, RDI) 20.4 obstructive events  per hour indicating moderate obstructive sleep apnea/hypopnea syndrome.  Most sleep and all events were recorded while supine.  There were 33  obstructive apneas and 73 hypopneas.  REM AHI 25.4.  He had difficulty  initially maintaining sleep, and there were not enough events or sleep time  to permit CPAP titration by protocol on this study night.  Note that sleep  onset was at 11:52 p.m.   OXYGEN DATA:  Moderate to loud snoring and occasional gasping with oxygen  desaturation to a nadir of 85%.  Mean oxygen saturation through the study  was 93% on room air.   CARDIAC DATA:  Normal sinus rhythm.   MOVEMENT/PARASOMNIA:  A total of 45 limb jerks were recorded of which seven  were associated with arousal or awakening for a periodic limb movement with  arousal index of 1.3 per hour which is insignificant.   IMPRESSION/RECOMMENDATIONS:  1.  Primary complaint has been of excessive daytime sleepiness.  Note that      sleep  onset was relatively delayed at 11:52 p.m.  2.  Moderate obstructive sleep apnea/hypopnea syndrome, apnea-plus-hypopnea      index 20.4 per hour with all events recorded while supine.  3.  Consider return for continuous positive airway pressure titration or      evaluate for alternative therapy as appropriate.  4.  Note moderate to loud snoring with oxygen desaturation to a nadir of      85%.  Mean saturation was normal at 93%.  Is there cardiopulmonary      disease?      Clinton D. Maple Hudson, M.D.  Diplomate, Biomedical engineer of Sleep Medicine  Electronically Signed     CDY/MEDQ  D:  05/09/2006 08:54:55  T:  05/09/2006 10:39:00  Job:  [id number]

## 2011-03-13 NOTE — Discharge Summary (Signed)
[name], [name] NO.:  [id number]   MEDICAL RECORD NO.:  [id number]          PATIENT TYPE:  INP   LOCATION:  6533                         FACILITY:  [facility]   PHYSICIAN:  Darlin Priestly, MD  DATE OF BIRTH:  09-21-1946   DATE OF ADMISSION:  04/16/2006  DATE OF DISCHARGE:  04/20/2006                                 DISCHARGE SUMMARY   DISCHARGE DIAGNOSES:  1.  Unstable angina with mildly elevated troponins at 0.07.  Negative CK-      MBs.  Negative MR.  2.  History of coronary disease with currently patent stents, but now 80%      stenosis of the circumflex at the bifurcation of the obtuse marginals      with angioplasty.  3.  Hypertension.  4.  Hyperlipidemia.  5.  Chronic obstructive pulmonary disease.  6.  Diabetes mellitus, type 2, stable.   DISCHARGE CONDITION:  Improved.   PROCEDURES:  April 19, 2006, combined left heart cath with PTCA of the  circumflex by Dr. Lenise Herald.   DISCHARGE MEDICATIONS:  1.  Toprol-XL 100 mg daily.  2.  Protonix 40 mg daily.  3.  Atrovent inhaler 2 puffs 4 times a day.  4.  May resume Glucophage 500 mg twice a day on Thursday.  5.  Plavix 75 mg daily.  6.  Imdur 90 mg daily.  7.  Aspirin 325 mg daily.  8.  Ranexa 1000 mg twice a day.  9.  Norvasc 5 mg daily.  10. Nitroglycerin under your tongue as needed for chest pain as before.  11. Resume benazepril 2.5 mg on Wednesday, April 21, 2006.   DISCHARGE INSTRUCTIONS:  1.  Low-fat, low-salt, diabetic diet.  2.  Increase activity slowly.  3.  No driving for 2 days.  4.  No lifting for 4 days.  5.  Wash right groin cath site with soap and water.  Call if any bleeding,      swelling or drainage.  May return to work later this week.  6.  Follow up with Dr. Jenne Campus May 10, 2006, at 10:45 a.m.   HISTORY OF PRESENT ILLNESS:  This 65 year old white male came to the  emergency room secondary to chest pain.  He was at [facility] with chest  tightness, diaphoresis,  shortness of breath.  Left store.  Also had some  abdominal pain.  Took a nitroglycerin on his way home, then took another one  at home.  Episode lasted 45 minutes, and then he came to the emergency room.  The 1st enzymes were essentially negative cardiac markers.  Blood pressure  104/63.  Pulse 109.  He was pain free in the ER after being started on  heparin and nitroglycerin.  He had had 3 bouts of chest pain prior to this  admission, but the day of the admission they were the worst pains.   PAST MEDICAL HISTORY:  1.  Coronary disease with an MI in 2006; 2 CYPHER stents to OM-1, 2 CYPHER      stents to OM-2, CYPHER stent in the  AV groove, and then a cath in August      2006, CYPHER stent to the OM-1, residual 90% RCA nondominant stenosis,      which continued, and then a cath in 09/2005 with a PCI for in-stent      restenosis to the OM-2.  2.  Hypertension.  3.  Hyperlipidemia.  4.  Ischemic cardiomyopathy, 41%.  5.  MRSA bacteremia at one point.  6.  COPD.  7.  History of syncope after taking nitroglycerin.  8.  Diabetes.  9.  Left bundle branch block.   OUTPATIENT MEDS:  1.  Benazepril 5 mg daily.  2.  Toprol XL 100 daily.  3.  Imdur 60 1-1/2 daily, equaling 90 mg.  4.  Aspirin 81.  5.  Ranexa 1000 twice a day.  6.  Protonix 40.  7.  Plavix 75.  8.  Glucophage 500.  9.  Sular.   FAMILY HISTORY, SOCIAL HISTORY, REVIEW OF SYSTEMS:  See H&P.   ALLERGIES:  No known allergies.   VITALS:  At discharge, blood pressure 120/54.  Pulse 70.  Respiratory rate  18.  Temp 98.2.  Oxygen saturation on room air 95%.  CHEST:  No shortness of breath.  No chest pain.  HEART:  Regular rate and rhythm without murmur.  EXTREMITIES:  Cath site stable without bleeding.   LABORATORY DATA:  Admitting labs:  Hemoglobin 13.4, hematocrit 38.4, WBC  10.9, platelets 404.  The platelets dropped down to 384, and his white count  dropped to 7.5.  At discharge, hemoglobin 13.1, hematocrit 37.8, WBC  6.8,  platelets 425.  Chemistry:  On admission, sodium 139, potassium 4.2,  chloride 107, CO2 24, BUN 24, creatinine 1.7, glucose 140.  Morning prior to  discharge, sodium 140, potassium 3.7, BUN 8, creatinine 1.0, and glucose  121.  Coags on admission:  PT 13, INR of 1, PTT 26.  Was on heparin, and was  stable.  LFTs:  AST 18, ALT 28, ALP 77, total bili 0.8, albumin 3.6.  Cardiac enzymes:  CKs ranged 54, 49, 118, 47, 40.  MB is 0.7 to 0.8.  Troponin I is 0.07 to 0.10.  Calcium remained stable.  UA was negative.  Stool for occult blood was negative.  TSH was 1.165.   Chest x-ray:  Mild bronchitic changes.  No acute abnormality.   EKGs:  Sinus rhythm, left bundle branch block on all his EKGs.   HOSPITAL COURSE:  The patient was admitted by Dr. Jenne Campus secondary to  unstable angina, progressive in nature.  Was put on the heparin and  nitroglycerin and admitted to a telemetry bed.  Cardiac enzymes revealed  elevated troponin, but negative CK-MBs.  He continued to be stable over the  weekend and by April 19, 2006, was ready for cardiac cath, which he  underwent.  He continues with his nondominant RCA 90% stenosis.  He had 80%  stenosis of the circumflex and underwent cutting balloon angioplasty.  Previous stents were patent.   By April 20, 2006, he was stable and was seen by Dr. Clarene Duke and ready for  discharge home.  He will follow up as an outpatient with Dr. Jenne Campus.      Darcella Gasman. Valarie Merino      Darlin Priestly, MD  Electronically Signed    LRI/MEDQ  D:  04/20/2006  T:  04/20/2006  Job:  [id number]   cc:   Osvaldo Shipper. Spruill, M.D.  Fax: [phone]

## 2011-03-13 NOTE — Consult Note (Signed)
[name], [name]:  [id number]   MEDICAL RECORD NO.:  [id number]          PATIENT TYPE:  INP   LOCATION:  2006                         FACILITY:  [facility]   PHYSICIAN:  Evelene Croon, M.D.     DATE OF BIRTH:  [birth year]   DATE OF CONSULTATION:  DATE OF DISCHARGE:                                   CONSULTATION   DATE OF CONSULTATION:  July 06, 2006.   REFERRING PHYSICIAN:  Darlin Priestly, M.D.   REASON FOR CONSULTATION:  Severe three-vessel coronary artery disease with  left circumflex restenosis, status post multiple prior percutaneous  interventions.   HISTORY OF PRESENT ILLNESS:  This patient is a 65 year old gentleman with  multiple cardiac risk factors and a history of coronary artery disease who  has undergone multiple percutaneous interventions over the past two years.  He has a history of an acute MI in July of 2006 with subsequent placement of  five CYPHER stents at that time.  He had two in the first obtuse marginal,  two in the second obtuse marginal, and one in the AV groove circumflex.  He  had a repeat catheterization which suggestion of ostial disease in the first  obtuse marginal and received a sixth CYPHER stent at that time.  He has had  recurrent episodes of instant restenosis of the second obtuse marginal stent  and undergone several percutaneous interventions for that.  His last  catheterization was performed in June of 2007 again revealing instant  restenosis of the ostial portion of the first obtuse marginal as well as  diffuse disease in the mid-AV groove circumflex.  On April 19, 2006, he  underwent cutting balloon angioplasty with PTCA of the first obtuse marginal  instant restenosis and PTCA of the mid-AV groove left circumflex.  The  patient said that his symptoms of chest pain and shortness of breath  resolved for a couple of weeks, but by the middle of July he returned with  recurrent episodes of substernal chest pain  and shortness of breath as well  as decreased energy level.  He has been treated with maximum medical  therapy, but was admitted on July 05, 2006, with about an eight-day  history of daily substernal chest pressure and shortness of breath with  minimal exertion such as lifting his arms above his head.  He ruled out for  myocardial infarction with negative enzymes.   REVIEW OF SYSTEMS:  GENERAL:  He denies any fever or chills.  He had  fatigue.  No recent weight changes.  EYES:  Negative.  ENT:  Negative.  ENDOCRINE:  He has adult-onset diabetes.  Denies hyperthyroidism.  CARDIOVASCULAR:  As above, he has had substernal chest pressure and  shortness of breath with minimal exertion.  His previous symptoms were more  tightness in his upper chest and neck.  He has had mild orthopnea.  Denies  PND.  He has had exertional dyspnea.  He denies peripheral edema.  RESPIRATORY:  He denies cough or sputum production.  GI:  He has had no  nausea or vomiting.  Denies melena and bright red blood per rectum.  GU:  Denies dysuria and hematuria.  MUSCULOSKELETAL:  He denies myalgias and  arthralgias.  NEUROLOGICAL:  He denies any focal weakness or numbness.  Denies dizziness or syncope.  He has never had a TIA or stroke.  ALLERGIES:  None.  PSYCHIATRIC:  Negative.   PAST MEDICAL HISTORY:  Significant for:  1. Hypertension.  2. Hyperlipidemia.  3. Adult-onset diabetes.  4. COPD.  5. Coronary artery disease as mentioned above.  6. He is status post multiple cardiac catheterizations and percutaneous      interventions.  7. He has a history of prior MRSA bacteremia that was treated.   SOCIAL HISTORY:  He is married and has three children and two grandchildren.  He quit smoking in July of 2006.  He is currently out on disability.  Denies  alcohol abuse.   FAMILY HISTORY:  Strongly positive for heart disease.  His brother died this  summer of a cardiac arrest at age 21.   MEDICATIONS AT TIME OF  ADMISSION:  As noted on his home medicine  reconciliation orders.  Of significance, he said he has been on Plavix  continuously since his stent procedures.   PHYSICAL EXAMINATION:  VITAL SIGNS:  Blood pressure is 101/59, his pulse is  67 and regular.  Respiratory rate is 20 and unlabored.  GENERAL:  He is a well-developed white male in no distress.  HEENT EXAM:  Shows him to be normocephalic and atraumatic.  Pupils are equal  and reactive to light and accommodation.  Extraocular muscles are intact.  His throat is clear.  NECK EXAM:  Shows normal carotid pulses bilaterally.  There are no bruits.  There is no adenopathy or thyromegaly.  CARDIAC EXAM:  Shows a regular rate and rhythm with normal S1-S2.  There is  no murmur, rub or gallop.  LUNGS:  Clear.  ABDOMINAL EXAM:  Shows normoactive bowel sounds.  His abdomen is soft and  nontender.  There are no palpable masses or organomegaly.  EXTREMITY EXAM:  Shows no peripheral edema.  Pedal pulses are palpable  bilaterally.  SKIN:  Warm and dry.  NEUROLOGIC EXAM:  Shows he is alert and oriented x3.  Motor and sensory exam  are grossly normal.   Carotid Doppler examination shows no evidence of internal coronary artery  stenosis.  Vertebral artery flow is antegrade bilaterally.  Upper extremity  peripheral vascular exam reveals his right palmar arch is normal with radial  compression but obliterates with ulnar compression.  The left superficial  palmar arch obliterates with radial compression and has normal ulnar  compression.   Laboratory examination shows normal electrolytes with a BUN of 21,  creatinine of 1.7 on admission.  Glucose is 139.  Troponin I peaked at .05  since admission.  CPKs have been negative.  His creatinine decreased to 1.1  on the day after admission.  His hemoglobin on September 11 was 12.6 with  platelet count of 353,000.  White blood cell count of 9.0.  Chest x-ray is pending.   Electrocardiogram shows normal  sinus rhythm with occasional PACs and a left  bundle-branch block pattern which is apparently chronic.   IMPRESSION:  Mr. Blume has had multiple percutaneous interventions and stent  placements for left circumflex stenoses.  He has had recurrent substernal  chest pain, shortness of breath, and fatigue with minimal exertion.  I agree  it is most likely he will require coronary artery bypass surgery.  He  probably has restenosis within the left circumflex stent.  I think he should  have a repeat cardiac catheterization to assess this area as well as to rule  out new left main artery stenosis or LAD coronary stenosis which would alter  his operative plan.  If there was a significant left main or LAD stenosis,  then he should have his LAD grafted, but if these remain widely open as  previously, then I would not recommend grafting his LAD since the graft  would likely become atretic.  I would recommend  continuing his Plavix since he has had unstable angina on maximum medical  therapy.  I discussed this case with Dr. Jenne Campus and he will plan to do a  repeat cardiac catheterization on July 07, 2006, and I will tentatively  plan on performing surgery on July 09, 2006.      Evelene Croon, M.D.  Electronically Signed     BB/MEDQ  D:  07/07/2006  T:  07/07/2006  Job:  [id number]

## 2011-10-15 ENCOUNTER — Observation Stay (HOSPITAL_COMMUNITY)
Admission: EM | Admit: 2011-10-15 | Discharge: 2011-10-16 | Disposition: A | Discharge status: Home / Self Care | Payer: Medicare Other | Attending: Cardiology | Admitting: Cardiology

## 2011-10-15 ENCOUNTER — Other Ambulatory Visit: Payer: Self-pay

## 2011-10-15 ENCOUNTER — Emergency Department (HOSPITAL_COMMUNITY): Payer: Medicare Other

## 2011-10-15 ENCOUNTER — Encounter: Payer: Self-pay | Admitting: *Deleted

## 2011-10-15 DIAGNOSIS — I2510 Atherosclerotic heart disease of native coronary artery without angina pectoris: Secondary | ICD-10-CM | POA: Insufficient documentation

## 2011-10-15 DIAGNOSIS — E785 Hyperlipidemia, unspecified: Secondary | ICD-10-CM | POA: Diagnosis present

## 2011-10-15 DIAGNOSIS — I25810 Atherosclerosis of coronary artery bypass graft(s) without angina pectoris: Secondary | ICD-10-CM | POA: Diagnosis present

## 2011-10-15 DIAGNOSIS — R079 Chest pain, unspecified: Principal | ICD-10-CM | POA: Diagnosis present

## 2011-10-15 DIAGNOSIS — G4733 Obstructive sleep apnea (adult) (pediatric): Secondary | ICD-10-CM | POA: Diagnosis present

## 2011-10-15 DIAGNOSIS — J4489 Other specified chronic obstructive pulmonary disease: Secondary | ICD-10-CM | POA: Insufficient documentation

## 2011-10-15 DIAGNOSIS — I447 Left bundle-branch block, unspecified: Secondary | ICD-10-CM | POA: Diagnosis present

## 2011-10-15 DIAGNOSIS — I219 Acute myocardial infarction, unspecified: Secondary | ICD-10-CM | POA: Insufficient documentation

## 2011-10-15 DIAGNOSIS — I10 Essential (primary) hypertension: Secondary | ICD-10-CM | POA: Diagnosis present

## 2011-10-15 DIAGNOSIS — I4891 Unspecified atrial fibrillation: Secondary | ICD-10-CM | POA: Insufficient documentation

## 2011-10-15 DIAGNOSIS — I480 Paroxysmal atrial fibrillation: Secondary | ICD-10-CM | POA: Diagnosis not present

## 2011-10-15 DIAGNOSIS — L409 Psoriasis, unspecified: Secondary | ICD-10-CM | POA: Insufficient documentation

## 2011-10-15 DIAGNOSIS — J449 Chronic obstructive pulmonary disease, unspecified: Secondary | ICD-10-CM | POA: Diagnosis present

## 2011-10-15 DIAGNOSIS — E119 Type 2 diabetes mellitus without complications: Secondary | ICD-10-CM | POA: Diagnosis present

## 2011-10-15 HISTORY — DX: Psoriasis, unspecified: L40.9

## 2011-10-15 HISTORY — DX: Essential (primary) hypertension: I10

## 2011-10-15 HISTORY — DX: Atherosclerotic heart disease of native coronary artery without angina pectoris: I25.10

## 2011-10-15 HISTORY — DX: Acute myocardial infarction, unspecified: I21.9

## 2011-10-15 LAB — BASIC METABOLIC PANEL
Calcium: 10.1 mg/dL (ref 8.4–10.5)
GFR calc non Af Amer: >90 mL/min (ref >90)
Sodium: 129 mEq/L — ABNORMAL LOW (ref 135–145)

## 2011-10-15 LAB — TROPONIN I: Troponin I: <0.3 ng/mL (ref <0.30)

## 2011-10-15 LAB — CBC
MCH: 33.5 pg (ref 26.0–34.0)
MCHC: 36.6 g/dL — ABNORMAL HIGH (ref 30.0–36.0)
Platelets: 280 10*3/uL (ref 150–400)
RBC: 5.08 MIL/uL (ref 4.22–5.81)

## 2011-10-15 LAB — D-DIMER, QUANTITATIVE: D-Dimer, Quant: 0.3 ug/mL-FEU (ref 0.00–0.48)

## 2011-10-15 MED ORDER — HEPARIN SOD (PORCINE) IN D5W 100 UNIT/ML IV SOLN
1300.0000 [IU]/h | INTRAVENOUS | Status: DC
  Administered 2011-10-16: 900 [IU]/h via INTRAVENOUS
  Administered 2011-10-16: 1000 [IU]/h via INTRAVENOUS
  Administered 2011-10-16: 900 [IU]/h via INTRAVENOUS
  Filled 2011-10-15 (×2): qty 250

## 2011-10-15 MED ORDER — HEPARIN SODIUM (PORCINE) 5000 UNIT/ML IJ SOLN
INTRAMUSCULAR | Status: AC
  Administered 2011-10-15: 5000 [IU] via INTRAVENOUS
  Filled 2011-10-15: qty 1

## 2011-10-15 MED ORDER — ASPIRIN 81 MG PO CHEW: 324.0000 mg | CHEWABLE_TABLET | Freq: Once | ORAL | Status: DC

## 2011-10-15 MED ORDER — HEPARIN BOLUS VIA INFUSION
4000.0000 [IU] | Freq: Once | INTRAVENOUS | Status: AC
Start: 2011-10-15 — End: 2011-10-16
  Administered 2011-10-16: 4000 [IU] via INTRAVENOUS
  Filled 2011-10-15: qty 4000

## 2011-10-15 NOTE — ED Notes (Signed)
Pt took aspirin today and plavix.

## 2011-10-15 NOTE — H&P (Signed)
YESHAYA Ray is an 65 [place].o. male.   Chief Complaint:  Chest Pain HPI:  Patient is a 65 year old Caucasian male with history of coronary artery bypass grafting x3 in 2007.   (sequential saphenous vein graft to the first and second obtuse marginal branches of the left circumflex coronary artery, and a saphenous vein graft to the posterolateral branch of the left circumflex coronary artery). His last his last heart catheterization on February 2011 showed ejection fraction 45-55% loss of the vein graft to the PDA, however, the native PDA had excellent flow. The LAD had no disease.  Patient's history also includes hypertension dyslipidemia paroxysmal atrial fibrillation left bundle branch block, COPD, obstructive sleep apnea, chronic chest pain syndrome.  He presents with a chest heaviness/pain and increased heart rate. This began at approximately 12:30 AM this morning. Patient states that woke him up from sleep. He associates the chest pain coincided with his increase in heart rate. There is associated shortness of breath and numbness in his right hand. Pain is 7/10 in intensity. Is currently resolved without the use of nitroglycerin. He's also had headache. He denies orthopnea diaphoresis nausea and vomiting, lower extremity edema, paroxysmal nocturnal dyspnea. He does report occasional dizziness upon standing.    Patient's initial cardiac enzymes are negative. He also had a normal d-dimer. EKG shows a chronic left bundle branch block.   Past Medical History  Diagnosis Date  . Coronary artery disease   . Myocardial infarction   . Psoriasis   . Hypertension   . Diabetes mellitus     Past Surgical History  Procedure Date  . Coronary artery bypass graft     History reviewed. No pertinent family history. Social History:  reports that he has quit smoking. He does not have any smokeless tobacco history on file. He reports that he does not drink alcohol or use illicit drugs.  Allergies: No Known  Allergies  Medications Prior to Admission  Medication Dose Route Frequency Provider Last Rate Last Dose  . aspirin chewable tablet 324 mg  324 mg Oral Once Dione Booze, MD       No current outpatient prescriptions on file as of 10/15/2011.    Results for orders placed during the hospital encounter of 10/15/11 (from the past 48 hour(s))  CBC     Status: Abnormal   Collection Time   10/15/11  6:32 PM      Component Value Range Comment   WBC 15.3 (*) 4.0 - 10.5 (K/uL)    RBC 5.08  4.22 - 5.81 (MIL/uL)    Hemoglobin 17.0  13.0 - 17.0 (g/dL)    HCT 13.0  86.5 - 78.4 (%)    MCV 91.5  78.0 - 100.0 (fL)    MCH 33.5  26.0 - 34.0 (pg)    MCHC 36.6 (*) 30.0 - 36.0 (g/dL)    RDW 69.6  29.5 - 28.4 (%)    Platelets 280  150 - 400 (K/uL)   BASIC METABOLIC PANEL     Status: Abnormal   Collection Time   10/15/11  6:32 PM      Component Value Range Comment   Sodium 129 (*) 135 - 145 (mEq/L)    Potassium 3.8  3.5 - 5.1 (mEq/L)    Chloride 93 (*) 96 - 112 (mEq/L)    CO2 22  19 - 32 (mEq/L)    Glucose, Bld 379 (*) 70 - 99 (mg/dL)    BUN 18  6 - 23 (mg/dL)  Creatinine, Ser 0.72  0.50 - 1.35 (mg/dL)    Calcium 40.9  8.4 - 10.5 (mg/dL)    GFR calc non Af Amer >90  >90 (mL/min)    GFR calc Af Amer >90  >90 (mL/min)   D-DIMER, QUANTITATIVE     Status: Normal   Collection Time   10/15/11  6:32 PM      Component Value Range Comment   D-Dimer, Quant 0.30  0.00 - 0.48 (ug/mL-FEU)   TROPONIN I     Status: Normal   Collection Time   10/15/11  6:35 PM      Component Value Range Comment   Troponin I <0.30  <0.30 (ng/mL)    Dg Chest 2 View  10/15/2011  *RADIOLOGY REPORT*  Clinical Data: Chest pain, prior CABG.  CHEST - 2 VIEW  Comparison: 08/27/2008  Findings: Again noted are changes of prior CABG.  No confluent airspace opacities or effusions.  Mild increased markings in the upper lobes, likely scarring.  Heart is normal size.  No acute bony abnormality.  IMPRESSION: No acute findings.  Prior CABG.   Original Report Authenticated By: Cyndie Chime, M.D.    Review of Systems  Constitutional: Negative for diaphoresis.  HENT: Negative for neck pain.   Eyes: Negative for blurred vision and double vision.  Respiratory: Positive for shortness of breath. Negative for wheezing.   Cardiovascular: Positive for chest pain and palpitations. Negative for orthopnea, leg swelling and PND.  Gastrointestinal: Negative for heartburn, nausea, vomiting, abdominal pain, diarrhea, constipation, blood in stool and melena.  Genitourinary: Negative for dysuria and hematuria.  Skin:       Psaoriasis  Neurological: Positive for dizziness, tingling (Tingling in his right hand) and headaches. Negative for weakness. Tremors: Dizziness upon standing.  All other systems reviewed and are negative.    Blood pressure 105/77, pulse 105, temperature 98.1 F (36.7 C), temperature source Oral, resp. rate 16, SpO2 96.00%. Physical Exam  Constitutional: He is oriented to person, place, and time. He appears well-developed and well-nourished. No distress.  HENT:  Head: Normocephalic and atraumatic.  Eyes: EOM are normal. Pupils are equal, round, and reactive to light. No scleral icterus.  Neck: Normal range of motion. Neck supple. No JVD present.  Cardiovascular: Regular rhythm, normal heart sounds and intact distal pulses.   No murmur heard.      Rate increased  Respiratory: Effort normal and breath sounds normal. He has no wheezes. He has no rales. He exhibits no tenderness.  GI: Soft. Bowel sounds are normal. There is tenderness.       Mild tenderness epigastric LUQ  Musculoskeletal: He exhibits no edema.  Lymphadenopathy:    He has no cervical adenopathy.  Neurological: He is alert and oriented to person, place, and time. He exhibits normal muscle tone.  Skin: Skin is warm and dry.       Diffuse psoriasis  Psychiatric: He has a normal mood and affect.     Assessment/Plan Patient Active Hospital Problem  List: DM (08/24/2008) MYOCARDIAL INFARCTION, HX OF (08/24/2008) CORONARY ARTERY DISEASE (08/24/2008) CHRONIC OBSTRUCTIVE PULMONARY DISEASE (08/24/2008) Dyslipidemia History or Tobacco abuse: Quite 2006  Plan: The patient will be admitted to telemetry unit.  Rule out acute coronary syndrome. We'll cycle cardiac enzymes.  He'll be started on IV heparin per pharmacy consult. Continue to monitor heart rate and blood pressure.  Will obtain office records in the morning. Patient may be scheduled for Lexiscan Myoview stress test or left heart catheterization.  Ksenia Kunz  W 10/15/2011, 10:30 PM

## 2011-10-15 NOTE — ED Notes (Signed)
Family member Waldo would like to be called if pt D/Ced or for updates.  [phone].

## 2011-10-15 NOTE — Progress Notes (Signed)
ANTICOAGULATION CONSULT NOTE - Initial Consult  Pharmacy Consult for IV heparin Indication: chest pain/ACS  No Known Allergies  Patient Measurements: Weight: 77.1 kg (per patient) Height: 5\' 10"  (per patient) Heparin dosing weight: 77.1 kg  Vital Signs: Temp: 98.1 F (36.7 C) (12/20 1754) Temp src: Oral (12/20 1754) BP: 105/77 mmHg (12/20 2055) Pulse Rate: 105  (12/20 2055)  Labs:  Basename 10/15/11 1835 10/15/11 1832  HGB -- 17.0  HCT -- 46.5  PLT -- 280  APTT -- --  LABPROT -- --  INR -- --  HEPARINUNFRC -- --  CREATININE -- 0.72  CKTOTAL -- --  CKMB -- --  TROPONINI <0.30 --   CrCl is unknown because there is no height on file for the current visit.  Medical History: Past Medical History  Diagnosis Date  . Coronary artery disease   . Myocardial infarction   . Psoriasis   . Hypertension   . Diabetes mellitus     Medications:   (Not in a hospital admission)  Assessment: Pharmacist System-Based Medication Review: Anticoagulation: CP/ACS to start IV heparin, not on coumadin PTA (was on ASA and Plavix), cbc wnl, patient does have hx of Afib.  Cardiovascular: hx CAD-CABGs in past, EF 45-55%, hx HTN/AFib. Home imdur/plavix not yet resumed.  Endocrinology: hx DM-GLU 379 on admit, no insulin yet initiatied. Was only on Metformin at home.   Nephrology: SCr 0.72 Pulmonary: hx COPD - only on albuterol PTA Best Practices: IV heparin, Spoke with patient regarding Heparin and patient agreeable.   Goal of Therapy:  Heparin level 0.3-0.7 units/ml   Plan:  1. Heparin 4000 units x1, then 900 units/hr (9 ml/hr). 2. Heparin level in 6 hours after rate initiated.  3. Daily heparin level and CBC.   Fayne Norrie, PharmD, BCPS 10/15/2011,10:51 PM

## 2011-10-15 NOTE — ED Notes (Signed)
To ed for eval of right side cp and right arm pain since last night. Seen by ucc prior to coming to ed.

## 2011-10-15 NOTE — ED Notes (Signed)
Placed on 2L [place]  

## 2011-10-15 NOTE — ED Notes (Signed)
Awoken from sleep at 0130 with right side CP, SOB, nausea. CP has been intermittent, onset while at rest. Denies fever, cold, cough. Also c/o palpitations, heart racing but denies now. States right hand numb with CP. Describes pain as heaviness.

## 2011-10-15 NOTE — ED Provider Notes (Addendum)
History     CSN: [id number]  Arrival date & time 10/15/11  1747   First MD Initiated Contact with Patient 10/15/11 1824      Chief Complaint  Patient presents with  . Chest Pain    (Consider location/radiation/quality/duration/timing/severity/associated sxs/prior treatment) The history is provided by the patient.   65 year old male had onset last night of tightness across his chest associated with palpitations. He checked his heart rate using his blood pressure monitor and it was as high as 116. There was associated dyspnea and mild nausea without vomiting. He did not have any diaphoresis. Symptoms were intermittent. When present, symptoms would last about 1-1.5 hours. Symptoms are not affected by breathing, body position, or exertion level. Nothing made it better nothing made it worse. Discomfort was 7/10 at its worst, and a 0/10 currently. He did not take any medication for it other than his routine aspirin, Plavix, and other medications. Similar symptoms in the past have been to do his heart. He went to an urgent care Center where an ECG was done and he was told to come to the emergency department.  Past Medical History  Diagnosis Date  . Coronary artery disease   . Myocardial infarction   . Psoriasis   . Hypertension   . Diabetes mellitus     Past Surgical History  Procedure Date  . Coronary artery bypass graft     History reviewed. No pertinent family history.  History  Substance Use Topics  . Smoking status: Former Games developer  . Smokeless tobacco: Not on file  . Alcohol Use: No      Review of Systems  All other systems reviewed and are negative.    Allergies  Review of patient's allergies indicates no known allergies.  Home Medications   Current Outpatient Rx  Name Route Sig Dispense Refill  . ALBUTEROL SULFATE HFA 108 (90 BASE) MCG/ACT IN AERS Inhalation Inhale 2 puffs into the lungs every 6 (six) hours as needed. For shortness of breath     . ASPIRIN 81 MG  PO TABS Oral Take 81 mg by mouth daily.      . ATORVASTATIN CALCIUM 40 MG PO TABS Oral Take 40 mg by mouth daily.      [name] CLOPIDOGREL BISULFATE 75 MG PO TABS Oral Take 75 mg by mouth daily.      [name] FA-PYRIDOXINE-CYANCOBALAMIN 2.5-25-2 MG PO TABS Oral Take 1 tablet by mouth daily.      . IMDUR PO Oral Take 1 tablet by mouth daily.      [name] LISINOPRIL 10 MG PO TABS Oral Take 10 mg by mouth daily.      [name] METFORMIN HCL 500 MG PO TABS Oral Take 500 mg by mouth 2 (two) times daily with a meal.      . METOPROLOL TARTRATE 100 MG PO TABS Oral Take 100 mg by mouth 2 (two) times daily.        BP 147/85  Pulse 104  Temp(Src) 98.1 F (36.7 C) (Oral)  Resp 18  SpO2 96%  Physical Exam  Nursing note and vitals reviewed.  64 year old male who is resting comfortably and in no acute distress. Vital signs are significant for tachycardia with heart rate 104 and borderline hypertension with blood pressure 147/85. Oxygen saturation is a satisfactory 96% on room air. Head is normocephalic and atraumatic. PERRLA, EOMI. Oropharynx is clear. Neck is supple without adenopathy or JVD. Back is nontender. Lungs are clear without rales, wheezes, or rhonchi. Heart has regular  rate and rhythm without murmur. Abdomen is soft, flat, nontender without masses or hepatosplenomegaly. Extremities have no cyanosis or edema, full range of motion present. Skin is warm and dry without rash. Neurologic: Mental status is normal, cranial nerves are intact, there no focal motor or sensory deficits. Psychiatric: No abnormalities of mood or affect.  ED Course  Procedures (including critical care time)   Labs Reviewed  CBC  BASIC METABOLIC PANEL  TROPONIN I   No results found.   Date: 10/15/2011  Rate: 111  Rhythm: sinus tachycardia  QRS Axis: normal  Intervals: QT prolonged  ST/T Wave abnormalities: normal  Conduction Disutrbances:left bundle branch block  Narrative Interpretation: Left bundle-branch block. When compared with ECG  of 12/04/2009, no significant changes are noted.  Old EKG Reviewed: unchanged   No diagnosis found.  Results for orders placed during the hospital encounter of 10/15/11  CBC      Component Value Range   WBC 15.3 (*) 4.0 - 10.5 (K/uL)   RBC 5.08  4.22 - 5.81 (MIL/uL)   Hemoglobin 17.0  13.0 - 17.0 (g/dL)   HCT 11.9  14.7 - 82.9 (%)   MCV 91.5  78.0 - 100.0 (fL)   MCH 33.5  26.0 - 34.0 (pg)   MCHC 36.6 (*) 30.0 - 36.0 (g/dL)   RDW 56.2  13.0 - 86.5 (%)   Platelets 280  150 - 400 (K/uL)  BASIC METABOLIC PANEL      Component Value Range   Sodium 129 (*) 135 - 145 (mEq/L)   Potassium 3.8  3.5 - 5.1 (mEq/L)   Chloride 93 (*) 96 - 112 (mEq/L)   CO2 22  19 - 32 (mEq/L)   Glucose, Bld 379 (*) 70 - 99 (mg/dL)   BUN 18  6 - 23 (mg/dL)   Creatinine, Ser 7.84  0.50 - 1.35 (mg/dL)   Calcium 69.6  8.4 - 10.5 (mg/dL)   GFR calc non Af Amer >90  >90 (mL/min)   GFR calc Af Amer >90  >90 (mL/min)  TROPONIN I      Component Value Range   Troponin I <0.30  <0.30 (ng/mL)  D-DIMER, QUANTITATIVE      Component Value Range   D-Dimer, Quant 0.30  0.00 - 0.48 (ug/mL-FEU)   Dg Chest 2 View  10/15/2011  *RADIOLOGY REPORT*  Clinical Data: Chest pain, prior CABG.  CHEST - 2 VIEW  Comparison: 08/27/2008  Findings: Again noted are changes of prior CABG.  No confluent airspace opacities or effusions.  Mild increased markings in the upper lobes, likely scarring.  Heart is normal size.  No acute bony abnormality.  IMPRESSION: No acute findings.  Prior CABG.  Original Report Authenticated By: Cyndie Chime, M.D.    He has remained pain-free in the emergency department. Consultation is obtained with [facility] who will come to evaluate the patient for possible hospital admission.  MDM  Chest pain and tachycardia - rule out cardiac disease rule out pulmonary embolism. Old records were reviewed, and he had a cardiac catheterization in February 2011 which showed an occluded graft, an  occluded stent, but no critical lesions in viable cardiac muscle.        Dione Booze, MD 10/15/11 2952  Dione Booze, MD 10/16/11 [phone]

## 2011-10-16 ENCOUNTER — Other Ambulatory Visit (HOSPITAL_COMMUNITY): Payer: Medicare Other

## 2011-10-16 ENCOUNTER — Other Ambulatory Visit: Payer: Self-pay

## 2011-10-16 ENCOUNTER — Inpatient Hospital Stay (HOSPITAL_COMMUNITY): Payer: Medicare Other

## 2011-10-16 DIAGNOSIS — I25810 Atherosclerosis of coronary artery bypass graft(s) without angina pectoris: Secondary | ICD-10-CM | POA: Diagnosis present

## 2011-10-16 DIAGNOSIS — I10 Essential (primary) hypertension: Secondary | ICD-10-CM | POA: Diagnosis present

## 2011-10-16 DIAGNOSIS — E785 Hyperlipidemia, unspecified: Secondary | ICD-10-CM | POA: Diagnosis present

## 2011-10-16 DIAGNOSIS — I480 Paroxysmal atrial fibrillation: Secondary | ICD-10-CM | POA: Diagnosis not present

## 2011-10-16 DIAGNOSIS — R079 Chest pain, unspecified: Secondary | ICD-10-CM | POA: Diagnosis present

## 2011-10-16 DIAGNOSIS — I447 Left bundle-branch block, unspecified: Secondary | ICD-10-CM | POA: Diagnosis present

## 2011-10-16 LAB — HEMOGLOBIN A1C
Hgb A1c MFr Bld: 11.5 % — ABNORMAL HIGH (ref <5.7)
Mean Plasma Glucose: 283 mg/dL — ABNORMAL HIGH (ref <117)

## 2011-10-16 LAB — LIPID PANEL
Cholesterol: 135 mg/dL (ref 0–200)
HDL: 34 mg/dL — ABNORMAL LOW (ref >39)
Total CHOL/HDL Ratio: 4 RATIO
Triglycerides: 187 mg/dL — ABNORMAL HIGH (ref <150)
VLDL: 37 mg/dL (ref 0–40)

## 2011-10-16 LAB — CARDIAC PANEL(CRET KIN+CKTOT+MB+TROPI)
CK, MB: 1.4 ng/mL (ref 0.3–4.0)
CK, MB: 1.4 ng/mL (ref 0.3–4.0)
Total CK: 69 U/L (ref 7–232)
Troponin I: <0.3 ng/mL (ref <0.30)
Troponin I: <0.3 ng/mL (ref <0.30)

## 2011-10-16 LAB — GLUCOSE, CAPILLARY: Glucose-Capillary: 248 mg/dL — ABNORMAL HIGH (ref 70–99)

## 2011-10-16 LAB — BASIC METABOLIC PANEL
CO2: 23 mEq/L (ref 19–32)
Chloride: 100 mEq/L (ref 96–112)
Creatinine, Ser: 0.56 mg/dL (ref 0.50–1.35)
GFR calc Af Amer: >90 mL/min (ref >90)
Potassium: 3.5 mEq/L (ref 3.5–5.1)
Sodium: 134 mEq/L — ABNORMAL LOW (ref 135–145)

## 2011-10-16 LAB — CBC
HCT: 42.4 % (ref 39.0–52.0)
Hemoglobin: 15.2 g/dL (ref 13.0–17.0)
MCH: 32.8 pg (ref 26.0–34.0)
MCHC: 35.8 g/dL (ref 30.0–36.0)
RBC: 4.63 MIL/uL (ref 4.22–5.81)

## 2011-10-16 LAB — PROTIME-INR: INR: 1.03 (ref 0.00–1.49)

## 2011-10-16 MED ORDER — ROSUVASTATIN CALCIUM 20 MG PO TABS
20.0000 mg | ORAL_TABLET | Freq: Every day | ORAL | Status: DC
  Administered 2011-10-16: 20 mg via ORAL
  Filled 2011-10-16: qty 1

## 2011-10-16 MED ORDER — FA-PYRIDOXINE-CYANOCOBALAMIN 2.5-25-2 MG PO TABS
1.0000 | ORAL_TABLET | Freq: Every day | ORAL | Status: DC
  Administered 2011-10-16: 1 via ORAL
  Filled 2011-10-16: qty 1

## 2011-10-16 MED ORDER — TECHNETIUM TC 99M TETROFOSMIN IV KIT
30.0000 | PACK | Freq: Once | INTRAVENOUS | Status: AC | PRN
  Administered 2011-10-16: 30 via INTRAVENOUS

## 2011-10-16 MED ORDER — ACETAMINOPHEN 325 MG PO TABS: 650.0000 mg | ORAL_TABLET | ORAL | Status: AC | PRN

## 2011-10-16 MED ORDER — ASPIRIN 81 MG PO CHEW
81.0000 mg | CHEWABLE_TABLET | Freq: Every day | ORAL | Status: DC
  Administered 2011-10-16: 81 mg via ORAL
  Filled 2011-10-16: qty 1

## 2011-10-16 MED ORDER — INSULIN ASPART 100 UNIT/ML ~~LOC~~ SOLN
0.0000 [IU] | Freq: Three times a day (TID) | SUBCUTANEOUS | Status: DC
  Administered 2011-10-16: 11 [IU] via SUBCUTANEOUS
  Filled 2011-10-16: qty 3

## 2011-10-16 MED ORDER — ALBUTEROL SULFATE HFA 108 (90 BASE) MCG/ACT IN AERS
2.0000 | INHALATION_SPRAY | Freq: Four times a day (QID) | RESPIRATORY_TRACT | Status: DC | PRN
  Filled 2011-10-16: qty 6.7

## 2011-10-16 MED ORDER — PANTOPRAZOLE SODIUM 40 MG PO TBEC: 40.0000 mg | DELAYED_RELEASE_TABLET | Freq: Every day | ORAL | Status: DC

## 2011-10-16 MED ORDER — INSULIN ASPART 100 UNIT/ML ~~LOC~~ SOLN: 0.0000 [IU] | Freq: Every day | SUBCUTANEOUS | Status: DC

## 2011-10-16 MED ORDER — ISOSORBIDE MONONITRATE ER 30 MG PO TB24
30.0000 mg | ORAL_TABLET | Freq: Every day | ORAL | Status: DC
  Administered 2011-10-16: 30 mg via ORAL
  Filled 2011-10-16: qty 1

## 2011-10-16 MED ORDER — REGADENOSON 0.4 MG/5ML IV SOLN
0.4000 mg | Freq: Once | INTRAVENOUS | Status: AC
  Administered 2011-10-16: 0.4 mg via INTRAVENOUS

## 2011-10-16 MED ORDER — NITROGLYCERIN 0.4 MG SL SUBL: 0.4000 mg | SUBLINGUAL_TABLET | SUBLINGUAL | Status: DC | PRN

## 2011-10-16 MED ORDER — PANTOPRAZOLE SODIUM 40 MG PO TBEC
40.0000 mg | DELAYED_RELEASE_TABLET | Freq: Every day | ORAL | Status: DC
  Administered 2011-10-16: 40 mg via ORAL
  Filled 2011-10-16: qty 1

## 2011-10-16 MED ORDER — ACETAMINOPHEN 325 MG PO TABS: 650.0000 mg | ORAL_TABLET | ORAL | Status: DC | PRN

## 2011-10-16 MED ORDER — METOPROLOL TARTRATE 100 MG PO TABS
100.0000 mg | ORAL_TABLET | Freq: Two times a day (BID) | ORAL | Status: DC
  Administered 2011-10-16 (×2): 100 mg via ORAL
  Filled 2011-10-16 (×4): qty 1

## 2011-10-16 MED ORDER — TECHNETIUM TC 99M TETROFOSMIN IV KIT
10.0000 | PACK | Freq: Once | INTRAVENOUS | Status: AC | PRN
  Administered 2011-10-16: 10 via INTRAVENOUS

## 2011-10-16 MED ORDER — CLOPIDOGREL BISULFATE 75 MG PO TABS
75.0000 mg | ORAL_TABLET | Freq: Every day | ORAL | Status: DC
  Administered 2011-10-16: 75 mg via ORAL
  Filled 2011-10-16: qty 1

## 2011-10-16 MED ORDER — ONDANSETRON HCL 4 MG/2ML IJ SOLN: 4.0000 mg | Freq: Four times a day (QID) | INTRAMUSCULAR | Status: DC | PRN

## 2011-10-16 MED ORDER — LISINOPRIL 10 MG PO TABS
10.0000 mg | ORAL_TABLET | Freq: Every day | ORAL | Status: DC
  Administered 2011-10-16: 10 mg via ORAL
  Filled 2011-10-16: qty 1

## 2011-10-16 MED ORDER — INSULIN ASPART 100 UNIT/ML ~~LOC~~ SOLN
0.0000 [IU] | Freq: Three times a day (TID) | SUBCUTANEOUS | Status: DC
  Filled 2011-10-16: qty 3

## 2011-10-16 NOTE — Progress Notes (Signed)
Inpatient Diabetes Program Recommendations  AACE/ADA: New Consensus Statement on Inpatient Glycemic Control (2009)  Target Ranges:  Prepandial:   less than 140 mg/dL      Peak postprandial:   less than 180 mg/dL (1-2 hours)      Critically ill patients:  140 - 180 mg/dL   Reason for Visit: Hyperglycemia  Inpatient Diabetes Program Recommendations Insulin - Basal: Add Lantus 15 units QHS Insulin - Meal Coverage: Novolog 3 units tidwc Outpatient Referral: OP Diabetes Education consult for 11.5%   Note: CBGs 248, 330

## 2011-10-16 NOTE — Progress Notes (Signed)
ANTICOAGULATION CONSULT NOTE - Follow Up Consult  Pharmacy Consult for heparin Indication: chest pain/ACS  No Known Allergies  Patient Measurements: Height: 5\' 10"  (177.8 cm) (From patient) Weight: 164 lb 0.4 oz (74.4 kg) IBW/kg (Calculated) : 73   Vital Signs: Temp: 97.6 F (36.4 C) (12/21 1350) Temp src: Oral (12/21 1350) BP: 112/73 mmHg (12/21 1350) Pulse Rate: 108  (12/21 1350)  Labs:  Basename 10/16/11 1416 10/16/11 0400 10/15/11 1835 10/15/11 1832  HGB -- 15.2 -- 17.0  HCT -- 42.4 -- 46.5  PLT -- 248 -- 280  APTT -- -- -- --  LABPROT -- 13.7 -- --  INR -- 1.03 -- --  HEPARINUNFRC <0.10* 0.25* -- --  CREATININE -- 0.56 -- 0.72  CKTOTAL 69 64 -- --  CKMB 1.4 1.4 -- --  TROPONINI <0.30 <0.30 <0.30 --   Estimated Creatinine Clearance: 96.3 ml/min (by C-G formula based on Cr of 0.56).   Medications:  Scheduled:     . aspirin  81 mg Oral Daily  . clopidogrel  75 mg Oral Daily  . folic acid-pyridoxine-cyancobalamin  1 tablet Oral Daily  . heparin      . heparin  4,000 Units Intravenous Once  . insulin aspart  0-15 Units Subcutaneous TID WC  . insulin aspart  0-5 Units Subcutaneous QHS  . isosorbide mononitrate  30 mg Oral Daily  . lisinopril  10 mg Oral Daily  . metoprolol  100 mg Oral BID  . pantoprazole  40 mg Oral Q0600  . regadenoson  0.4 mg Intravenous Once  . rosuvastatin  20 mg Oral q1800  . DISCONTD: aspirin  324 mg Oral Once  . DISCONTD: insulin aspart  0-15 Units Subcutaneous TID WC   Infusions:     . heparin 1,000 Units/hr (10/16/11 1610)    Assessment: 65yo male on IV heparin for ACS. Pt is s/p stress test today -heparin is turned off approx 20 min during this procedure but currently infusing. Heparin level now undetectable.  No bleeding noted.   Goal of Therapy:  Heparin level 0.3-0.7 units/ml   Plan:  1) Increase heparin 1300 units/hr 2) Check 6hr heparin level 3) F/U anticoag plans  Elson Clan PharmD  BCPS 10/16/2011,3:24 PM

## 2011-10-16 NOTE — Progress Notes (Signed)
ANTICOAGULATION CONSULT NOTE - Follow Up Consult  Pharmacy Consult for heparin Indication: chest pain/ACS  No Known Allergies  Patient Measurements: Height: 5\' 10"  (177.8 cm) (From patient) Weight: 164 lb 0.4 oz (74.4 kg) IBW/kg (Calculated) : 73   Vital Signs: Temp: 97.5 F (36.4 C) (12/21 0452) Temp src: Oral (12/21 0452) BP: 118/77 mmHg (12/21 0452) Pulse Rate: 95  (12/21 0452)  Labs:  Basename 10/16/11 0400 10/15/11 1835 10/15/11 1832  HGB 15.2 -- 17.0  HCT 42.4 -- 46.5  PLT 248 -- 280  APTT -- -- --  LABPROT 13.7 -- --  INR 1.03 -- --  HEPARINUNFRC 0.25* -- --  CREATININE 0.56 -- 0.72  CKTOTAL 64 -- --  CKMB 1.4 -- --  TROPONINI <0.30 <0.30 --   Estimated Creatinine Clearance: 96.3 ml/min (by C-G formula based on Cr of 0.56).   Medications:  Scheduled:    . aspirin  81 mg Oral Daily  . clopidogrel  75 mg Oral Daily  . folic acid-pyridoxine-cyancobalamin  1 tablet Oral Daily  . heparin      . heparin  4,000 Units Intravenous Once  . isosorbide mononitrate  30 mg Oral Daily  . lisinopril  10 mg Oral Daily  . metoprolol  100 mg Oral BID  . pantoprazole  40 mg Oral Q0600  . rosuvastatin  20 mg Oral q1800  . DISCONTD: aspirin  324 mg Oral Once   Infusions:    . heparin 900 Units/hr (10/16/11 0005)    Assessment: 65yo male slightly subtherapeutic on heparin with initial dosing for CP; not currently on cath schedule.  Goal of Therapy:  Heparin level 0.3-0.7 units/ml   Plan:  Will increase gtt by ~1 unit/kg/hr to 1000 units/hr and check level in 6hr.  Colleen Can PharmD BCPS 10/16/2011,5:19 AM

## 2011-10-16 NOTE — Progress Notes (Signed)
Pt. Seen and examined. Agree with the NP/PA-C note as written.  Chest pain has resolved. NST shows mostly fixed inferoseptal defect with hypokinesis in this region. Last cath showed occluded graft to PDA and non-dominant RCA. No reversible coronary ischemia. I suspect his chest pain is due to sleep apnea. He has been non-compliant with his mask and awakened gasping with his heart racing.  I strongly encouraged compliance.  Ok for d/c today. Follow-up with Dr. Alanda Amass in our office.  Chrystie Nose, MD Attending Cardiologist [facility]

## 2011-10-16 NOTE — H&P (Addendum)
Pt. Seen and examined. Agree with the NP/PA-C note as written.  Some typical angina features, however, tachycardia raises the possibility of arrythmia or perhaps OSA.  Agree with NST tomorrow.  Will review.  Chrystie Nose, MD Attending Cardiologist [facility]

## 2011-10-16 NOTE — Discharge Summary (Signed)
Patient ID: [name],  MRN: [id number], DOB/AGE: 1945/12/07 65 y.o.  Admit date: 10/15/2011 Discharge date: 10/16/2011  Primary Care Provider: Primary Cardiologist: Dr Alanda Amass  Discharge Diagnoses  Principal Problem:  *Chest pain, Dr Rennis Golden feels possibly secondary to sleep apnea with non-compliance with C-Pap. Myoview low risk.  Active Problems:  CAD (coronary artery disease) of bypass graft, CABG 2007, cath Feb 2011  DM, Type 2 NIDDM  OBSTRUCTIVE SLEEP APNEA, non compliant with C-Pap  CHRONIC OBSTRUCTIVE PULMONARY DISEASE  PAF (paroxysmal atrial fibrillation), history of PAF in past, in NSR on adm  LBBB (left bundle branch block)  HTN (hypertension)  Dyslipidemia    Procedures: Myoview 12/20  History of Present Illness:   Patient is a 65 y/o  male with history of coronary artery bypass grafting x3 in 2007. (sequential saphenous vein graft to the first and second obtuse marginal branches of the left circumflex coronary artery, and a saphenous vein graft to the posterolateral branch of the left circumflex coronary artery). His last his last heart catheterization on February 2011 showed ejection fraction 45-55% loss of the vein graft to the PDA, however, the native PDA had excellent flow. The LAD had no disease. Patient's history also includes hypertension dyslipidemia paroxysmal atrial fibrillation left bundle branch block, COPD, obstructive sleep apnea, chronic chest pain syndrome. He presents with a chest heaviness/pain and increased heart rate   Hospital Course :The patient was admitted to telemetry and started on heparin. Enzymes were negative. Myoview was low risk. Dr Rennis Golden feels he can be discharged and follow up with Dr Alanda Amass. He has been encouraged to resume C-Pap use.  Discharge Vitals:  Blood pressure 112/73, pulse 108, temperature 97.6 F (36.4 C), temperature source Oral, resp. rate 20, height 5\' 10"  (1.778 m), weight 74.4 kg (164 lb 0.4 oz), SpO2 92.00%.     Labs: Results for orders placed during the hospital encounter of 10/15/11 (from the past 48 hour(s))  CBC     Status: Abnormal   Collection Time   10/15/11  6:32 PM      Component Value Range Comment   WBC 15.3 (*) 4.0 - 10.5 (K/uL)    RBC 5.08  4.22 - 5.81 (MIL/uL)    Hemoglobin 17.0  13.0 - 17.0 (g/dL)    HCT 40.9  81.1 - 91.4 (%)    MCV 91.5  78.0 - 100.0 (fL)    MCH 33.5  26.0 - 34.0 (pg)    MCHC 36.6 (*) 30.0 - 36.0 (g/dL)    RDW 78.2  95.6 - 21.3 (%)    Platelets 280  150 - 400 (K/uL)   BASIC METABOLIC PANEL     Status: Abnormal   Collection Time   10/15/11  6:32 PM      Component Value Range Comment   Sodium 129 (*) 135 - 145 (mEq/L)    Potassium 3.8  3.5 - 5.1 (mEq/L)    Chloride 93 (*) 96 - 112 (mEq/L)    CO2 22  19 - 32 (mEq/L)    Glucose, Bld 379 (*) 70 - 99 (mg/dL)    BUN 18  6 - 23 (mg/dL)    Creatinine, Ser 0.86  0.50 - 1.35 (mg/dL)    Calcium 57.8  8.4 - 10.5 (mg/dL)    GFR calc non Af Amer >90  >90 (mL/min)    GFR calc Af Amer >90  >90 (mL/min)   D-DIMER, QUANTITATIVE     Status: Normal   Collection Time   10/15/11  6:32 PM      Component Value Range Comment   D-Dimer, Quant 0.30  0.00 - 0.48 (ug/mL-FEU)   TROPONIN I     Status: Normal   Collection Time   10/15/11  6:35 PM      Component Value Range Comment   Troponin I <0.30  <0.30 (ng/mL)   HEPARIN LEVEL (UNFRACTIONATED)     Status: Abnormal   Collection Time   10/16/11  4:00 AM      Component Value Range Comment   Heparin Unfractionated 0.25 (*) 0.30 - 0.70 (IU/mL)   CBC     Status: Abnormal   Collection Time   10/16/11  4:00 AM      Component Value Range Comment   WBC 12.3 (*) 4.0 - 10.5 (K/uL)    RBC 4.63  4.22 - 5.81 (MIL/uL)    Hemoglobin 15.2  13.0 - 17.0 (g/dL)    HCT 16.1  09.6 - 04.5 (%)    MCV 91.6  78.0 - 100.0 (fL)    MCH 32.8  26.0 - 34.0 (pg)    MCHC 35.8  30.0 - 36.0 (g/dL)    RDW 40.9  81.1 - 91.4 (%)    Platelets 248  150 - 400 (K/uL)   CARDIAC PANEL(CRET  KIN+CKTOT+MB+TROPI)     Status: Normal   Collection Time   10/16/11  4:00 AM      Component Value Range Comment   Total CK 64  7 - 232 (U/L)    CK, MB 1.4  0.3 - 4.0 (ng/mL)    Troponin I <0.30  <0.30 (ng/mL)    Relative Index RELATIVE INDEX IS INVALID  0.0 - 2.5    PROTIME-INR     Status: Normal   Collection Time   10/16/11  4:00 AM      Component Value Range Comment   Prothrombin Time 13.7  11.6 - 15.2 (seconds)    INR 1.03  0.00 - 1.49    TSH     Status: Normal   Collection Time   10/16/11  4:00 AM      Component Value Range Comment   TSH 0.686  0.350 - 4.500 (uIU/mL)   MAGNESIUM     Status: Normal   Collection Time   10/16/11  4:00 AM      Component Value Range Comment   Magnesium 1.6  1.5 - 2.5 (mg/dL)   HEMOGLOBIN N8G     Status: Abnormal   Collection Time   10/16/11  4:00 AM      Component Value Range Comment   Hemoglobin A1C 11.5 (*) <5.7 (%)    Mean Plasma Glucose 283 (*) <117 (mg/dL)   BASIC METABOLIC PANEL     Status: Abnormal   Collection Time   10/16/11  4:00 AM      Component Value Range Comment   Sodium 134 (*) 135 - 145 (mEq/L)    Potassium 3.5  3.5 - 5.1 (mEq/L)    Chloride 100  96 - 112 (mEq/L)    CO2 23  19 - 32 (mEq/L)    Glucose, Bld 251 (*) 70 - 99 (mg/dL)    BUN 14  6 - 23 (mg/dL)    Creatinine, Ser 9.56  0.50 - 1.35 (mg/dL)    Calcium 9.0  8.4 - 10.5 (mg/dL)    GFR calc non Af Amer >90  >90 (mL/min)    GFR calc Af Amer >90  >90 (mL/min)   LIPID PANEL  Status: Abnormal   Collection Time   10/16/11  4:00 AM      Component Value Range Comment   Cholesterol 135  0 - 200 (mg/dL)    Triglycerides 161 (*) <150 (mg/dL)    HDL 34 (*) >09 (mg/dL)    Total CHOL/HDL Ratio 4.0      VLDL 37  0 - 40 (mg/dL)    LDL Cholesterol 64  0 - 99 (mg/dL)   GLUCOSE, CAPILLARY     Status: Abnormal   Collection Time   10/16/11  6:01 AM      Component Value Range Comment   Glucose-Capillary 248 (*) 70 - 99 (mg/dL)   GLUCOSE, CAPILLARY     Status: Abnormal    Collection Time   10/16/11  1:05 PM      Component Value Range Comment   Glucose-Capillary 330 (*) 70 - 99 (mg/dL)    Comment 1 Notify RN     CARDIAC PANEL(CRET KIN+CKTOT+MB+TROPI)     Status: Normal   Collection Time   10/16/11  2:16 PM      Component Value Range Comment   Total CK 69  7 - 232 (U/L)    CK, MB 1.4  0.3 - 4.0 (ng/mL)    Troponin I <0.30  <0.30 (ng/mL)    Relative Index RELATIVE INDEX IS INVALID  0.0 - 2.5    HEPARIN LEVEL (UNFRACTIONATED)     Status: Abnormal   Collection Time   10/16/11  2:16 PM      Component Value Range Comment   Heparin Unfractionated <0.10 (*) 0.30 - 0.70 (IU/mL)   MRSA PCR SCREENING     Status: Normal   Collection Time   10/16/11  2:52 PM      Component Value Range Comment   MRSA by PCR NEGATIVE  NEGATIVE      Disposition:  Follow-up Information    Follow up with Governor Rooks, MD on 10/28/2011. (3:15)    Contact information:   [address]  [place] [address] [phone]          Discharge Medications:  Current Discharge Medication List    START taking these medications   Details  acetaminophen (TYLENOL) 325 MG tablet Take 2 tablets (650 mg total) by mouth every 4 (four) hours as needed. Qty: 30 tablet    nitroGLYCERIN (NITROSTAT) 0.4 MG SL tablet Place 1 tablet (0.4 mg total) under the tongue every 5 (five) minutes as needed for chest pain. Qty: 25 tablet, Refills: 2    pantoprazole (PROTONIX) 40 MG tablet Take 1 tablet (40 mg total) by mouth daily at 6 (six) AM. Qty: 30 tablet, Refills: 3      CONTINUE these medications which have NOT CHANGED   Details  albuterol (PROVENTIL HFA;VENTOLIN HFA) 108 (90 BASE) MCG/ACT inhaler Inhale 2 puffs into the lungs every 6 (six) hours as needed. For shortness of breath     aspirin 81 MG tablet Take 81 mg by mouth daily.      atorvastatin (LIPITOR) 40 MG tablet Take 40 mg by mouth daily.      clopidogrel (PLAVIX) 75 MG tablet Take 75 mg  by mouth daily.      folic acid-pyridoxine-cyancobalamin (FOLTX) 2.5-25-2 MG TABS Take 1 tablet by mouth daily.      isosorbide mononitrate (IMDUR) 30 MG 24 hr tablet Take 30 mg by mouth daily.      lisinopril (PRINIVIL,ZESTRIL) 10 MG tablet Take 10 mg  by mouth daily.      metFORMIN (GLUCOPHAGE) 500 MG tablet Take 500 mg by mouth 2 (two) times daily with a meal.      metoprolol (LOPRESSOR) 100 MG tablet Take 100 mg by mouth 2 (two) times daily.          Outstanding Labs/Studies  Duration of Discharge Encounter: Greater than 30 minutes including physician time.  Jolene Provost PA-C 10/16/2011 4:30 PM

## 2011-10-16 NOTE — Discharge Summary (Signed)
Sherard Sutch C. Ean Gettel, MD Attending Cardiologist [facility]  

## 2011-10-16 NOTE — Progress Notes (Signed)
Subjective:  Mo chest pain  Objective:  Vital Signs in the last 24 hours: Temp:  [97.4 F (36.3 C)-98.1 F (36.7 C)] 97.6 F (36.4 C) (12/21 1350) Pulse Rate:  [89-116] 108  (12/21 1350) Resp:  [16-20] 20  (12/21 1350) BP: (105-147)/(61-85) 112/73 mmHg (12/21 1350) SpO2:  [92 %-98 %] 92 % (12/21 1350) Weight:  [74.4 kg (164 lb 0.4 oz)-77.111 kg (170 lb)] 164 lb 0.4 oz (74.4 kg) (12/21 0500)  Intake/Output from previous day:  Intake/Output Summary (Last 24 hours) at 10/16/11 1520 Last data filed at 10/16/11 0802  Gross per 24 hour  Intake      0 ml  Output    550 ml  Net   -550 ml    Physical Exam: General appearance: alert, cooperative and no distress Lungs: clear to auscultation bilaterally Heart: regular rate and rhythm   Rate: 90  Rhythm: normal sinus rhythm and LBBB  Lab Results:  Basename 10/16/11 0400 10/15/11 1832  WBC 12.3* 15.3*  HGB 15.2 17.0  PLT 248 280    Basename 10/16/11 0400 10/15/11 1832  NA 134* 129*  K 3.5 3.8  CL 100 93*  CO2 23 22  GLUCOSE 251* 379*  BUN 14 18  CREATININE 0.56 0.72    Basename 10/16/11 1416 10/16/11 0400  TROPONINI <0.30 <0.30   Hepatic Function Panel No results found for this basename: PROT,ALBUMIN,AST,ALT,ALKPHOS,BILITOT,BILIDIR,IBILI in the last 72 hours  Basename 10/16/11 0400  CHOL 135    Basename 10/16/11 0400  INR 1.03    Imaging: Dg Chest 2 View  10/15/2011  *RADIOLOGY REPORT*  Clinical Data: Chest pain, prior CABG.  CHEST - 2 VIEW  Comparison: 08/27/2008  Findings: Again noted are changes of prior CABG.  No confluent airspace opacities or effusions.  Mild increased markings in the upper lobes, likely scarring.  Heart is normal size.  No acute bony abnormality.  IMPRESSION: No acute findings.  Prior CABG.  Original Report Authenticated By: Cyndie Chime, M.D.    Cardiac Studies:  Assessment/Plan:   Principal Problem:  *Chest pain  Active Problems:  CAD (coronary artery disease) of bypass  graft, CABG 2007, cath Feb 2011  DM, Type 2 NIDDM  OBSTRUCTIVE SLEEP APNEA  CHRONIC OBSTRUCTIVE PULMONARY DISEASE  PAF (paroxysmal atrial fibrillation), history of PAF in past, in NSR on adm  LBBB (left bundle branch block)  HTN (hypertension)  Dyslipidemia   Plan- Myoview today   Corine Shelter PA-C 10/16/2011, 3:20 PM

## 2011-10-20 ENCOUNTER — Emergency Department (INDEPENDENT_AMBULATORY_CARE_PROVIDER_SITE_OTHER): Payer: Medicare Other

## 2011-10-20 ENCOUNTER — Emergency Department (HOSPITAL_BASED_OUTPATIENT_CLINIC_OR_DEPARTMENT_OTHER)
Admission: EM | Admit: 2011-10-20 | Discharge: 2011-10-20 | Disposition: A | Discharge status: Home / Self Care | Payer: Medicare Other | Attending: Emergency Medicine | Admitting: Emergency Medicine

## 2011-10-20 ENCOUNTER — Encounter (HOSPITAL_BASED_OUTPATIENT_CLINIC_OR_DEPARTMENT_OTHER): Payer: Self-pay | Admitting: *Deleted

## 2011-10-20 DIAGNOSIS — L0291 Cutaneous abscess, unspecified: Secondary | ICD-10-CM

## 2011-10-20 DIAGNOSIS — L02219 Cutaneous abscess of trunk, unspecified: Secondary | ICD-10-CM | POA: Insufficient documentation

## 2011-10-20 DIAGNOSIS — L03319 Cellulitis of trunk, unspecified: Secondary | ICD-10-CM | POA: Insufficient documentation

## 2011-10-20 DIAGNOSIS — L0390 Cellulitis, unspecified: Secondary | ICD-10-CM

## 2011-10-20 DIAGNOSIS — K612 Anorectal abscess: Secondary | ICD-10-CM

## 2011-10-20 LAB — URINALYSIS, ROUTINE W REFLEX MICROSCOPIC
Glucose, UA: >1000 mg/dL — AB
Ketones, ur: 15 mg/dL — AB
Leukocytes, UA: NEGATIVE
Nitrite: NEGATIVE
Protein, ur: 30 mg/dL — AB
Specific Gravity, Urine: >1.046 — ABNORMAL HIGH (ref 1.005–1.030)
Urobilinogen, UA: 1 mg/dL (ref 0.0–1.0)
pH: 6 (ref 5.0–8.0)

## 2011-10-20 LAB — CBC
HCT: 40 % (ref 39.0–52.0)
Hemoglobin: 14.4 g/dL (ref 13.0–17.0)
MCH: 32 pg (ref 26.0–34.0)
MCHC: 36 g/dL (ref 30.0–36.0)
MCV: 88.9 fL (ref 78.0–100.0)
Platelets: 335 10*3/uL (ref 150–400)
RBC: 4.5 MIL/uL (ref 4.22–5.81)
RDW: 12.2 % (ref 11.5–15.5)
WBC: 14.5 10*3/uL — ABNORMAL HIGH (ref 4.0–10.5)

## 2011-10-20 LAB — BASIC METABOLIC PANEL
BUN: 17 mg/dL (ref 6–23)
CO2: 22 mEq/L (ref 19–32)
Calcium: 9.1 mg/dL (ref 8.4–10.5)
Chloride: 95 mEq/L — ABNORMAL LOW (ref 96–112)
Creatinine, Ser: 0.6 mg/dL (ref 0.50–1.35)
GFR calc Af Amer: >90 mL/min (ref >90)
GFR calc non Af Amer: >90 mL/min (ref >90)
Glucose, Bld: 312 mg/dL — ABNORMAL HIGH (ref 70–99)
Potassium: 3.8 mEq/L (ref 3.5–5.1)
Sodium: 130 mEq/L — ABNORMAL LOW (ref 135–145)

## 2011-10-20 LAB — DIFFERENTIAL
Basophils Absolute: 0.1 10*3/uL (ref 0.0–0.1)
Basophils Relative: 0 % (ref 0–1)
Eosinophils Absolute: 0.1 10*3/uL (ref 0.0–0.7)
Eosinophils Relative: 1 % (ref 0–5)
Lymphocytes Relative: 10 % — ABNORMAL LOW (ref 12–46)
Lymphs Abs: 1.4 10*3/uL (ref 0.7–4.0)
Monocytes Absolute: 1.4 10*3/uL — ABNORMAL HIGH (ref 0.1–1.0)
Monocytes Relative: 10 % (ref 3–12)
Neutro Abs: 11.6 10*3/uL — ABNORMAL HIGH (ref 1.7–7.7)
Neutrophils Relative %: 80 % — ABNORMAL HIGH (ref 43–77)

## 2011-10-20 LAB — URINE MICROSCOPIC-ADD ON

## 2011-10-20 MED ORDER — IOHEXOL 300 MG/ML  SOLN
100.0000 mL | Freq: Once | INTRAMUSCULAR | Status: AC | PRN
  Administered 2011-10-20: 100 mL via INTRAVENOUS

## 2011-10-20 MED ORDER — SULFAMETHOXAZOLE-TRIMETHOPRIM 800-160 MG PO TABS: 1.0000 | ORAL_TABLET | Freq: Two times a day (BID) | ORAL | Status: AC

## 2011-10-20 MED ORDER — LIDOCAINE-EPINEPHRINE 2 %-1:100000 IJ SOLN
20.0000 mL | Freq: Once | INTRAMUSCULAR | Status: AC
  Administered 2011-10-20: 20 mL via INTRADERMAL
  Filled 2011-10-20: qty 1

## 2011-10-20 MED ORDER — ONDANSETRON HCL 4 MG/2ML IJ SOLN
4.0000 mg | Freq: Once | INTRAMUSCULAR | Status: AC
  Administered 2011-10-20: 4 mg via INTRAVENOUS
  Filled 2011-10-20: qty 2

## 2011-10-20 MED ORDER — VANCOMYCIN HCL IN DEXTROSE 1-5 GM/200ML-% IV SOLN
INTRAVENOUS | Status: AC
  Filled 2011-10-20: qty 200

## 2011-10-20 MED ORDER — IOHEXOL 300 MG/ML  SOLN: 20.0000 mL | Freq: Once | INTRAMUSCULAR | Status: DC | PRN

## 2011-10-20 MED ORDER — HYDROMORPHONE HCL PF 1 MG/ML IJ SOLN
1.0000 mg | Freq: Once | INTRAMUSCULAR | Status: AC
  Administered 2011-10-20: 1 mg via INTRAVENOUS
  Filled 2011-10-20: qty 1

## 2011-10-20 MED ORDER — VANCOMYCIN HCL 10 G IV SOLR
1000.0000 mg | Freq: Once | INTRAVENOUS | Status: AC
  Administered 2011-10-20: 1000 mg via INTRAVENOUS
  Filled 2011-10-20: qty 1000

## 2011-10-20 MED ORDER — SODIUM CHLORIDE 0.9 % IV BOLUS (SEPSIS)
1000.0000 mL | Freq: Once | INTRAVENOUS | Status: AC
  Administered 2011-10-20: 1000 mL via INTRAVENOUS

## 2011-10-20 NOTE — ED Notes (Signed)
Pt. Will be discharged after the vancomycin has infused.

## 2011-10-20 NOTE — ED Notes (Signed)
Pt. Has noted redness in the R scrotal area and  In the R buttock area.  Pt. Has noted edema as well with pain.

## 2011-10-20 NOTE — ED Notes (Signed)
Abscess between his scrotum and rectum.

## 2011-10-20 NOTE — ED Provider Notes (Signed)
History     65yM with "boil". Onset about a week ago. Progressively worsening. Between anus and scrotum. No fever or chills. Some spontaneous drainage. Diabetic. No n/v. Denies hx of significant etoh. No back pain. Denies hx of similar. No urinary complaints.   CSN: [id number]  Arrival date & time 10/20/11  1210   First MD Initiated Contact with Patient 10/20/11 1327      Chief Complaint  Patient presents with  . Abscess    (Consider location/radiation/quality/duration/timing/severity/associated sxs/prior treatment) HPI  Past Medical History  Diagnosis Date  . Coronary artery disease   . Myocardial infarction   . Psoriasis   . Hypertension   . Diabetes mellitus     Past Surgical History  Procedure Date  . Coronary artery bypass graft     No family history on file.  History  Substance Use Topics  . Smoking status: Former Games developer  . Smokeless tobacco: Not on file  . Alcohol Use: No      Review of Systems   Review of symptoms negative unless otherwise noted in HPI.   Allergies  Review of patient's allergies indicates no known allergies.  Home Medications   Current Outpatient Rx  Name Route Sig Dispense Refill  . ACETAMINOPHEN 325 MG PO TABS Oral Take 2 tablets (650 mg total) by mouth every 4 (four) hours as needed. 30 tablet   . ALBUTEROL SULFATE HFA 108 (90 BASE) MCG/ACT IN AERS Inhalation Inhale 2 puffs into the lungs every 6 (six) hours as needed. For shortness of breath     . ASPIRIN 81 MG PO TABS Oral Take 81 mg by mouth daily.      . ATORVASTATIN CALCIUM 40 MG PO TABS Oral Take 40 mg by mouth daily.      [name] CLOPIDOGREL BISULFATE 75 MG PO TABS Oral Take 75 mg by mouth daily.      [name] FA-PYRIDOXINE-CYANCOBALAMIN 2.5-25-2 MG PO TABS Oral Take 1 tablet by mouth daily.      . ISOSORBIDE MONONITRATE ER 30 MG PO TB24 Oral Take 30 mg by mouth daily.      [name] LISINOPRIL 10 MG PO TABS Oral Take 10 mg by mouth daily.      [name] METFORMIN HCL 500 MG PO TABS Oral Take 500  mg by mouth 2 (two) times daily with a meal.      . METOPROLOL TARTRATE 100 MG PO TABS Oral Take 100 mg by mouth 2 (two) times daily.      [name] NITROGLYCERIN 0.4 MG SL SUBL Sublingual Place 1 tablet (0.4 mg total) under the tongue every 5 (five) minutes as needed for chest pain. 25 tablet 2  . PANTOPRAZOLE SODIUM 40 MG PO TBEC Oral Take 1 tablet (40 mg total) by mouth daily at 6 (six) AM. 30 tablet 3    BP 126/66  Pulse 88  Temp(Src) 97.8 F (36.6 C) (Oral)  Resp 22  SpO2 98%  Physical Exam  Nursing note and vitals reviewed. Constitutional: He appears well-developed and well-nourished. No distress.  HENT:  Head: Normocephalic and atraumatic.  Eyes: Conjunctivae are normal. Right eye exhibits no discharge. Left eye exhibits no discharge.  Neck: Neck supple.  Cardiovascular: Normal rate, regular rhythm and normal heart sounds.  Exam reveals no gallop and no friction rub.   No murmur heard. Pulmonary/Chest: Effort normal and breath sounds normal. No respiratory distress.  Abdominal: Soft. He exhibits no distension. There is no tenderness.  Genitourinary:       Large  fluctuant lesion perineal region extending to base of scrotum to perianal region. Surrounding induration and overlying erythema. Spontaneous purulent drainage noted. Does not appear to involve scrotum. Moderate tenderness in rectal region, but no fluctuance appreciated.  Musculoskeletal: He exhibits no edema and no tenderness.  Neurological: He is alert.  Skin: Skin is warm and dry.       Lesions consistent with psoriasis on extensor surfaces of elbows/knees  Psychiatric: He has a normal mood and affect. His behavior is normal. Thought content normal.    ED Course  Procedures (including critical care time)  INCISION AND DRAINAGE Performed by: Raeford Razor Consent: Verbal consent obtained. Risks and benefits: risks, benefits and alternatives were discussed Type: abscess  Body area: perineum  Anesthesia: local  infiltration  Local anesthetic: lidocaine 2% w/epinephrine  Anesthetic total: 3 ml  Complexity: complex Blunt dissection to break up loculations  Drainage: purulent  Drainage amount: 3-4cc  Packing material: 1/4 in iodoform gauze  Patient tolerance: Patient tolerated the procedure well with no immediate complications.     Labs Reviewed  CBC - Abnormal; Notable for the following:    WBC 14.5 (*)    All other components within normal limits  DIFFERENTIAL - Abnormal; Notable for the following:    Neutrophils Relative 80 (*)    Neutro Abs 11.6 (*)    Lymphocytes Relative 10 (*)    Monocytes Absolute 1.4 (*)    All other components within normal limits  BASIC METABOLIC PANEL - Abnormal; Notable for the following:    Sodium 130 (*)    Chloride 95 (*)    Glucose, Bld 312 (*)    All other components within normal limits  URINALYSIS, ROUTINE W REFLEX MICROSCOPIC - Abnormal; Notable for the following:    Color, Urine AMBER (*) BIOCHEMICALS MAY BE AFFECTED BY COLOR   Specific Gravity, Urine >1.046 (*)    Glucose, UA >1000 (*)    Hgb urine dipstick TRACE (*)    Bilirubin Urine SMALL (*)    Ketones, ur 15 (*)    Protein, ur 30 (*)    All other components within normal limits  URINE MICROSCOPIC-ADD ON   Ct Abdomen Pelvis W Contrast  10/20/2011  *RADIOLOGY REPORT*  Clinical Data: Perianal abscess  CT ABDOMEN AND PELVIS WITH CONTRAST  Technique:  Multidetector CT imaging of the abdomen and pelvis was performed following the standard protocol during bolus administration of intravenous contrast.  Contrast: OMNIPAQUE IOHEXOL 300 MG/ML IV SOLN  Comparison: 12/02/2009  Findings: Geographic fatty changes in the liver are noted.  The spleen, stomach, duodenum, pancreas, gallbladder, adrenal glands, and kidneys have normal imaging features. Aorta is nonaneurysmal with maximal aortic diameter of 2.5 cm.  There is no free fluid or lymphadenopathy in the abdomen.  Abdominal bowel loops are  unremarkable.  Imaging through the pelvis shows no free fluid.  There is no pelvic sidewall lymphadenopathy.  Bladder is normal.  Prostate gland is enlarged.  Left inguinal hernia contains only fat.  No evidence for colonic diverticulitis.  The terminal ileum is normal.  The appendix is normal.  A small lipoma is noted in the left gluteus musculature.  There is edema/inflammation along the medial aspect of the right buttocks region, immediately adjacent to the inter gluteal fold. This is just deep to the inferior skin, but there is no focal or rim enhancing fluid collection to suggest a discrete or drainable abscess.  There is no evidence for extension of edema/inflammation into the pelvic floor  or ischioanal fat.  Bone windows reveal no worrisome lytic or sclerotic osseous lesions.  IMPRESSION: Edema/inflammation is identified in the inferior and medial most aspect of the right buttock, adjacent to the inter gluteal fold. There is no discrete fluid collection to suggest a drainable abscess.  Imaging features are compatible with cellulitis.  No definite findings to suggest a fistulous tract on this CT.  Original Report Authenticated By: ERIC A. MANSELL, M.D.     1. Abscess   2. Cellulitis       MDM  65yM with perineal abscess. No CT evidence of deeper involvement. Area of spontaneous drainage incised to facilitate further drainage and actually drained a fair bit of pus. Dose of IV abx in ED. Given hx of diabetes and mild surrounding cellulitis will give course of abx. Return for re-check tomorrow. Wound care and return precautions discussed.        Raeford Razor, MD 10/20/11 [phone]

## 2011-10-20 NOTE — ED Notes (Signed)
Omnipaque given by Radiology. Pt. Aware of the Metformin not being taken for a certain amt of time.

## 2011-10-22 ENCOUNTER — Emergency Department (HOSPITAL_BASED_OUTPATIENT_CLINIC_OR_DEPARTMENT_OTHER)
Admission: EM | Admit: 2011-10-22 | Discharge: 2011-10-22 | Disposition: A | Discharge status: Home / Self Care | Payer: Medicare Other | Attending: Emergency Medicine | Admitting: Emergency Medicine

## 2011-10-22 ENCOUNTER — Encounter (HOSPITAL_BASED_OUTPATIENT_CLINIC_OR_DEPARTMENT_OTHER): Payer: Self-pay

## 2011-10-22 DIAGNOSIS — Z79899 Other long term (current) drug therapy: Secondary | ICD-10-CM | POA: Insufficient documentation

## 2011-10-22 DIAGNOSIS — N401 Enlarged prostate with lower urinary tract symptoms: Secondary | ICD-10-CM | POA: Insufficient documentation

## 2011-10-22 DIAGNOSIS — Z5189 Encounter for other specified aftercare: Secondary | ICD-10-CM

## 2011-10-22 DIAGNOSIS — E119 Type 2 diabetes mellitus without complications: Secondary | ICD-10-CM | POA: Insufficient documentation

## 2011-10-22 DIAGNOSIS — Z09 Encounter for follow-up examination after completed treatment for conditions other than malignant neoplasm: Secondary | ICD-10-CM | POA: Insufficient documentation

## 2011-10-22 DIAGNOSIS — I2510 Atherosclerotic heart disease of native coronary artery without angina pectoris: Secondary | ICD-10-CM | POA: Insufficient documentation

## 2011-10-22 DIAGNOSIS — N138 Other obstructive and reflux uropathy: Secondary | ICD-10-CM | POA: Insufficient documentation

## 2011-10-22 DIAGNOSIS — I10 Essential (primary) hypertension: Secondary | ICD-10-CM | POA: Insufficient documentation

## 2011-10-22 DIAGNOSIS — I252 Old myocardial infarction: Secondary | ICD-10-CM | POA: Insufficient documentation

## 2011-10-22 MED ORDER — HYDROCODONE-ACETAMINOPHEN 5-500 MG PO TABS: 1.0000 | ORAL_TABLET | Freq: Four times a day (QID) | ORAL | Status: AC | PRN

## 2011-10-22 NOTE — ED Notes (Signed)
Secondary Assessment- Incision site in perineum area appears pink and healing.  Tenderness noted upon palpation.  Packing is not visible, however pt states packing is in place.

## 2011-10-22 NOTE — ED Provider Notes (Signed)
History     CSN: [id number]  Arrival date & time 10/22/11  1031   First MD Initiated Contact with Patient 10/22/11 1112      Chief Complaint  Patient presents with  . Wound Check    (Consider location/radiation/quality/duration/timing/severity/associated sxs/prior treatment) HPI Comments: Seen here two days ago for perineal abscess which was I+Ded.  He was sent home with antibiotics he couldn't fill due to the weather.  Returns to the ER today for a follow up.  He reports it as still sore, but no further problems.    Patient is a 65 y.o. male presenting with wound check. The history is provided by the patient.  Wound Check  He was treated in the ED 2 to 3 days ago. Previous treatment in the ED includes I&D of abscess.    Past Medical History  Diagnosis Date  . Coronary artery disease   . Myocardial infarction   . Psoriasis   . Hypertension   . Diabetes mellitus     Past Surgical History  Procedure Date  . Coronary artery bypass graft     No family history on file.  History  Substance Use Topics  . Smoking status: Former Games developer  . Smokeless tobacco: Not on file  . Alcohol Use: No      Review of Systems  All other systems reviewed and are negative.    Allergies  Review of patient's allergies indicates no known allergies.  Home Medications   Current Outpatient Rx  Name Route Sig Dispense Refill  . ACETAMINOPHEN 325 MG PO TABS Oral Take 2 tablets (650 mg total) by mouth every 4 (four) hours as needed. 30 tablet   . ALBUTEROL SULFATE HFA 108 (90 BASE) MCG/ACT IN AERS Inhalation Inhale 2 puffs into the lungs every 6 (six) hours as needed. For shortness of breath     . ASPIRIN 81 MG PO TABS Oral Take 81 mg by mouth daily.      . ATORVASTATIN CALCIUM 40 MG PO TABS Oral Take 40 mg by mouth daily.      [name] CLOPIDOGREL BISULFATE 75 MG PO TABS Oral Take 75 mg by mouth daily.      [name] FA-PYRIDOXINE-CYANCOBALAMIN 2.5-25-2 MG PO TABS Oral Take 1 tablet by mouth daily.       . ISOSORBIDE MONONITRATE ER 30 MG PO TB24 Oral Take 30 mg by mouth daily.      [name] LISINOPRIL 10 MG PO TABS Oral Take 10 mg by mouth daily.      [name] METFORMIN HCL 500 MG PO TABS Oral Take 500 mg by mouth 2 (two) times daily with a meal.      . METOPROLOL TARTRATE 100 MG PO TABS Oral Take 100 mg by mouth 2 (two) times daily.      [name] NITROGLYCERIN 0.4 MG SL SUBL Sublingual Place 1 tablet (0.4 mg total) under the tongue every 5 (five) minutes as needed for chest pain. 25 tablet 2  . PANTOPRAZOLE SODIUM 40 MG PO TBEC Oral Take 1 tablet (40 mg total) by mouth daily at 6 (six) AM. 30 tablet 3  . SULFAMETHOXAZOLE-TRIMETHOPRIM 800-160 MG PO TABS Oral Take 1 tablet by mouth every 12 (twelve) hours. 14 tablet 0    BP 122/73  Pulse 84  Temp(Src) 97.1 F (36.2 C) (Oral)  Resp 16  Ht 5\' 10"  (1.778 m)  Wt 165 lb (74.844 kg)  BMI 23.68 kg/m2  SpO2 99%  Physical Exam  Constitutional: He is oriented to person,  place, and time. He appears well-developed and well-nourished. No distress.  HENT:  Head: Normocephalic and atraumatic.  Neck: Normal range of motion. Neck supple.  Neurological: He is alert and oriented to person, place, and time.  Skin: He is not diaphoretic.       The abscess area in the perineum is firm but no erythema.  The packing looks to have fallen out but there is still drainage from the area.      ED Course  Procedures (including critical care time)  Labs Reviewed - No data to display Ct Abdomen Pelvis W Contrast  10/20/2011  *RADIOLOGY REPORT*  Clinical Data: Perianal abscess  CT ABDOMEN AND PELVIS WITH CONTRAST  Technique:  Multidetector CT imaging of the abdomen and pelvis was performed following the standard protocol during bolus administration of intravenous contrast.  Contrast: OMNIPAQUE IOHEXOL 300 MG/ML IV SOLN  Comparison: 12/02/2009  Findings: Geographic fatty changes in the liver are noted.  The spleen, stomach, duodenum, pancreas, gallbladder, adrenal glands, and  kidneys have normal imaging features. Aorta is nonaneurysmal with maximal aortic diameter of 2.5 cm.  There is no free fluid or lymphadenopathy in the abdomen.  Abdominal bowel loops are unremarkable.  Imaging through the pelvis shows no free fluid.  There is no pelvic sidewall lymphadenopathy.  Bladder is normal.  Prostate gland is enlarged.  Left inguinal hernia contains only fat.  No evidence for colonic diverticulitis.  The terminal ileum is normal.  The appendix is normal.  A small lipoma is noted in the left gluteus musculature.  There is edema/inflammation along the medial aspect of the right buttocks region, immediately adjacent to the inter gluteal fold. This is just deep to the inferior skin, but there is no focal or rim enhancing fluid collection to suggest a discrete or drainable abscess.  There is no evidence for extension of edema/inflammation into the pelvic floor or ischioanal fat.  Bone windows reveal no worrisome lytic or sclerotic osseous lesions.  IMPRESSION: Edema/inflammation is identified in the inferior and medial most aspect of the right buttock, adjacent to the inter gluteal fold. There is no discrete fluid collection to suggest a drainable abscess.  Imaging features are compatible with cellulitis.  No definite findings to suggest a fistulous tract on this CT.  Original Report Authenticated By: ERIC A. MANSELL, M.D.     No diagnosis found.    MDM  There is no erythema but the skin in the perineum remains firm but I cannot feel any fluctuance.  I am able to express more purulent material from the I + D site.  His wife is filling the prescription for the antibiotics which I have recommended he starts, continues soaks.  If he does not improve or worsens in the next 48 hours, he has been recommended to go the ER at [facility] or [facility] for evaluation, possible consultation with a Careers adviser.          Geoffery Lyons, MD 10/22/11 1124

## 2011-10-22 NOTE — ED Notes (Signed)
Pt is here for a recheck.  He was seen 10/20/2011 and had an I&D in perineal area.  States he hasn't filled PO ABX.

## 2011-12-19 IMAGING — CT CT ANGIO CHEST
2 of 5 series · 18 of 46 positions shown · IV contrast (APPLIED)
Comparison: 08/18/2006

CTA CHEST

CLINICAL DATA: Chest pain, fatigue, shortness of breath, coronary
disease, hypertension and diabetes

CT ANGIOGRAPHY CHEST, ABDOMEN AND PELVIS
TECHNIQUE: Multidetector CT imaging through the chest, abdomen and
pelvis was performed using the standard protocol during bolus
administration of intravenous contrast.  Multiplanar reconstructed
images including MIPs were obtained and reviewed to evaluate the
vascular anatomy.
Contrast: 100 ml Omnipaque 350

[Series 6: dissection 2.0 st · axial · 0.69mm/px · z∈[+780,+1370]mm · 15 of 331 slices shown]
[im 18/331  lung]
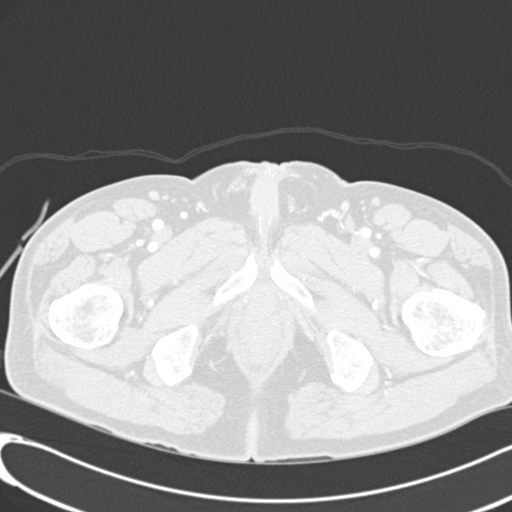
[im 35/331  soft-tissue]
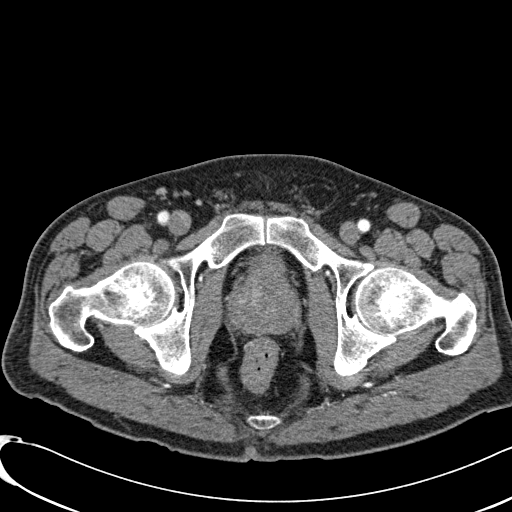
[im 70/331  lung]
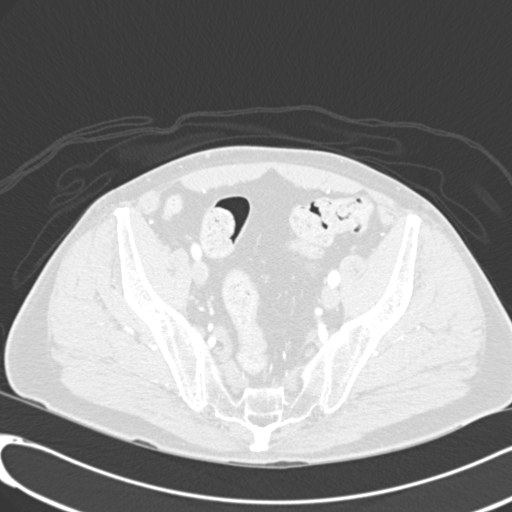
[im 87/331  soft-tissue]
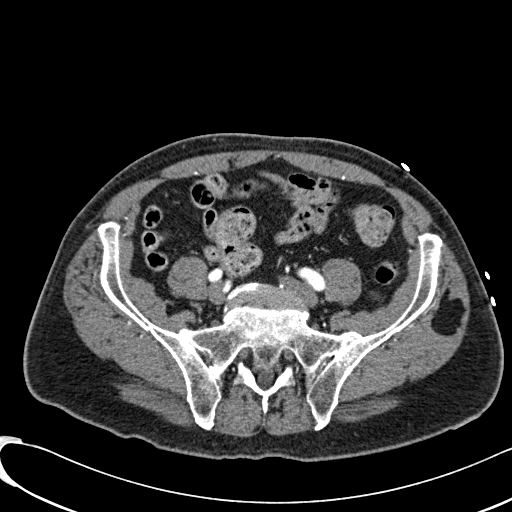
[im 105/331  lung]
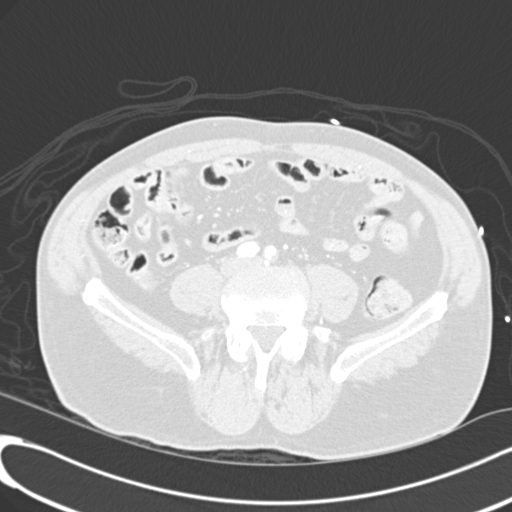
[im 122/331  soft-tissue]
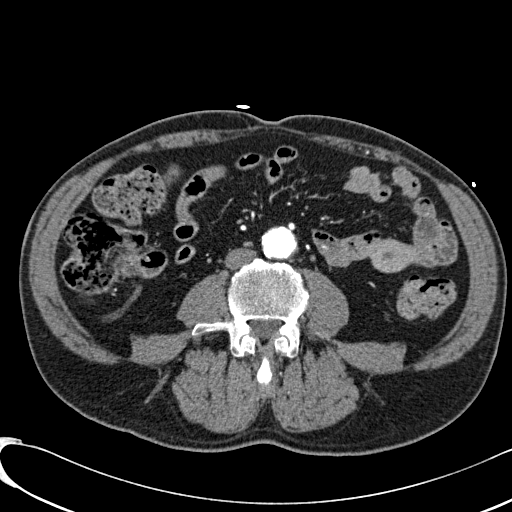
[im 139/331  lung]
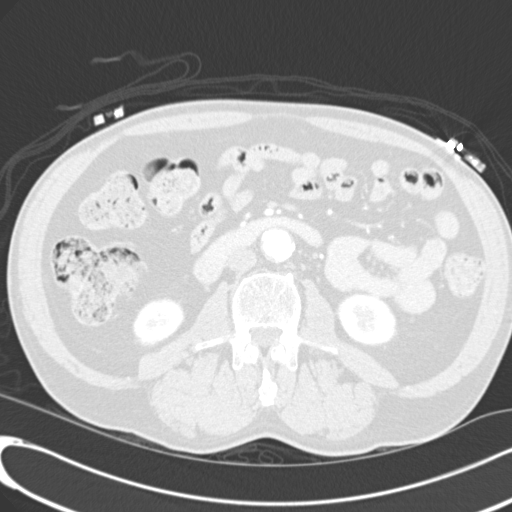
[im 174/331  soft-tissue]
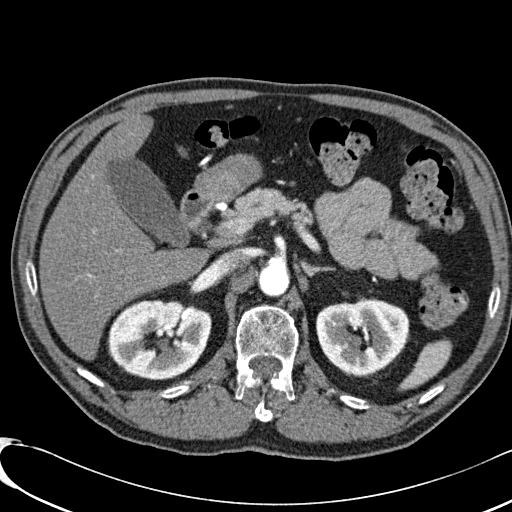
[im 192/331  lung]
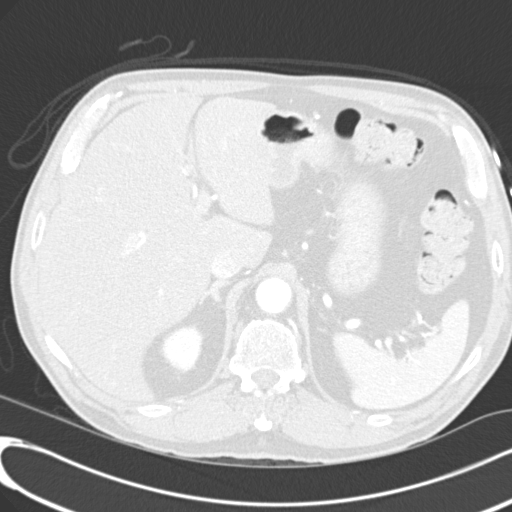
[im 209/331  soft-tissue]
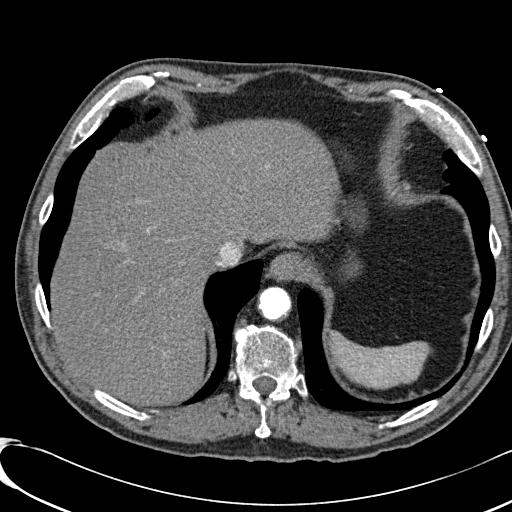
[im 226/331  lung]
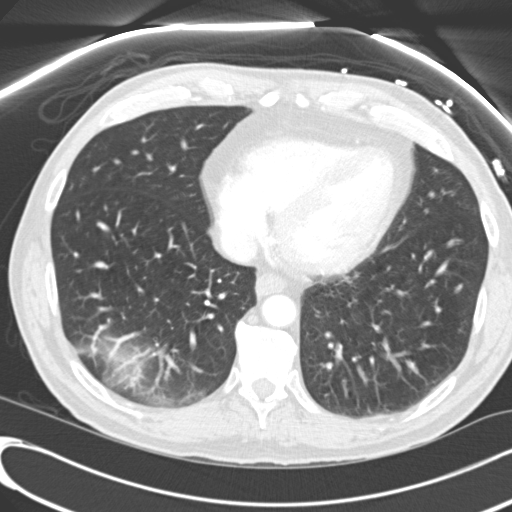
[im 244/331  soft-tissue]
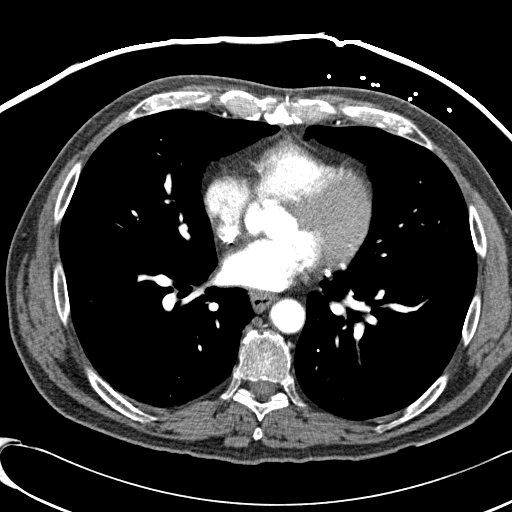
[im 278/331  lung]
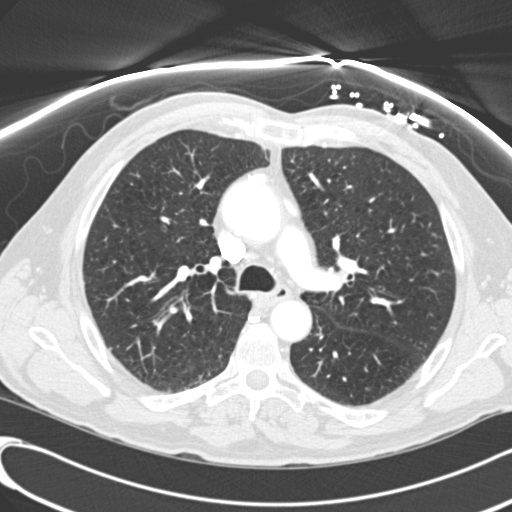
[im 296/331  soft-tissue]
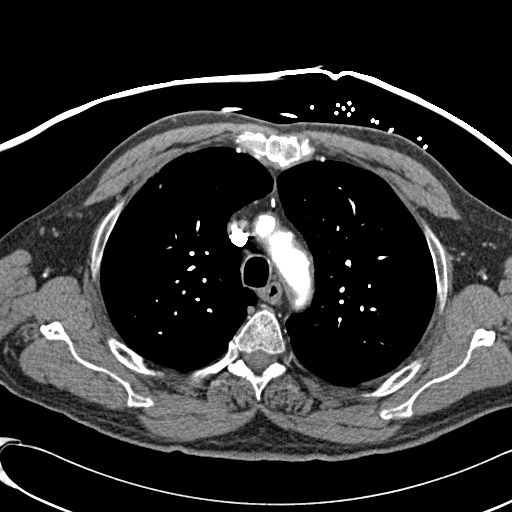
[im 313/331  lung]
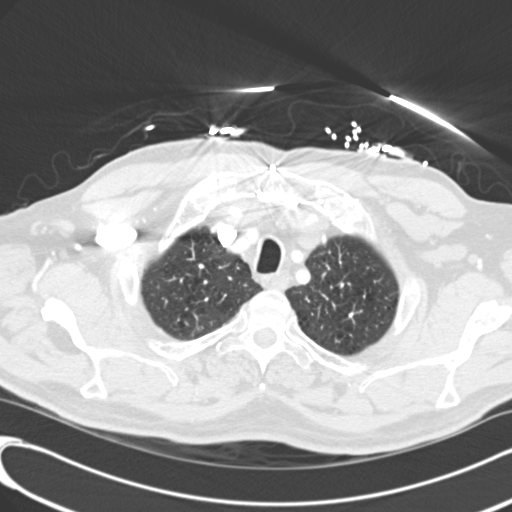

[Series 602: cor · coronal · 1.29mm/px · 3 of 118 slices shown]
[im 40/118  soft-tissue]
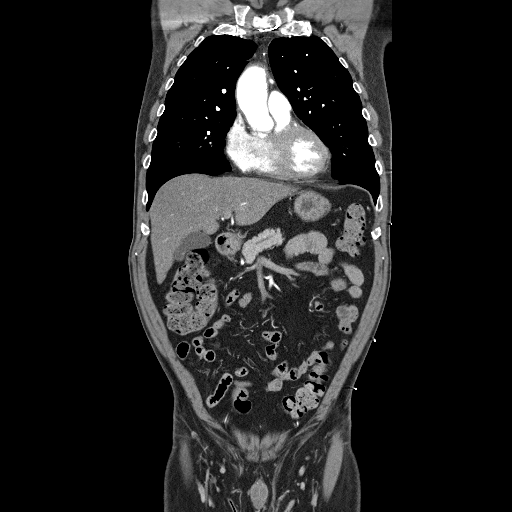
[im 53/118  soft-tissue]
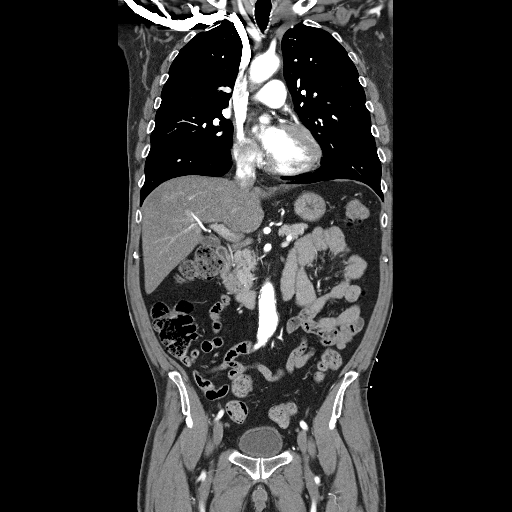
[im 66/118  soft-tissue]
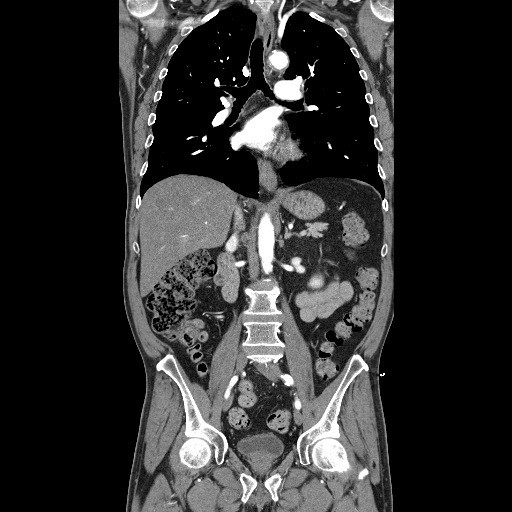

[18 of 46 positions shown; findings below may reference images not displayed]

FINDINGS: Negative for dissection, mediastinal hematoma, or
intramural hematoma.  Mild dilatation of the ascending thoracic
aorta, maximal diameter 4.0 cm, image 59.  Atherosclerosis of the
native coronary vessels.  The patient is status post median
sternotomy for coronary bypass.  Normal heart size.  No pericardial
or pleural effusion.  No evidence of acute significant pulmonary
embolus.  No hiatal hernia.

Lung windows demonstrate hyperinflation with mild upper lobe
predominant centrilobular emphysema.  Minimal right upper lobe
subpleural scarring.  Minimal bibasilar atelectasis.  No focal
consolidation, airspace disease, edema, interstitial process or
central airway abnormality.

 Review of the MIP images confirms the above findings.
IMPRESSION: Thoracic atherosclerosis without evidence of dissection
Negative for pulmonary embolus
Emphysema and right upper lobe scarring

CTA ABDOMEN
FINDINGS: Abdominal aorta demonstrates mural wall thickening and
atherosclerotic calcific plaque formation.  Negative for acute
dissection, aneurysm or retroperitoneal hemorrhage.  No aortoiliac
occlusive disease identified.  The celiac, SMA, single renal
arteries, and IMA are patent.  There is mild narrowing of the SMA
origin, less than 50%.

Liver, gallbladder, the kidneys, adrenal glands, spleen, and
pancreas within normal limits for arterial phase imaging.  Exam of
bowel demonstrates no obstruction, dilatation, or ileus.  Oral
contrast was not administered for a CTA protocol limiting
evaluation of the bowel.

 Review of the MIP images confirms the above findings.
IMPRESSION: Abdominal aortic atherosclerosis.  Negative for dissection or
aneurysm.  No acute aortic finding.

CTA PELVIS
FINDINGS: The common, external and internal iliac arteries are
patent in the pelvis.  No evidence of acute iliac dissection,
significant aneurysm or occlusion.  Diffuse calcific
atherosclerosis noted of the iliac vessels.  There is mild
narrowing of the left common iliac artery, less than 50%
proximally.  The left internal iliac artery demonstrates heavy
plaque formation, narrowing the lumen greater than 50% proximally.

The common femoral, proximal profunda femoral and SFA origins are
all patent.

Degenerative changes of the spine.  Incidental intramuscular lipoma
left gluteal musculature.  There is a small fat containing left
inguinal hernia.  No acute distal bowel process.

 Review of the MIP images confirms the above findings.
IMPRESSION: Negative for acute iliac dissection.  Iliac atherosclerosis.

## 2012-02-09 ENCOUNTER — Emergency Department (HOSPITAL_COMMUNITY): Payer: Medicare Other

## 2012-02-09 ENCOUNTER — Encounter (HOSPITAL_COMMUNITY): Payer: Self-pay | Admitting: Emergency Medicine

## 2012-02-09 ENCOUNTER — Inpatient Hospital Stay (HOSPITAL_COMMUNITY)
Admission: EM | Admit: 2012-02-09 | Discharge: 2012-02-12 | DRG: 244 | Disposition: A | Discharge status: Home / Self Care | Payer: Medicare Other | Attending: Cardiovascular Disease | Admitting: Cardiovascular Disease

## 2012-02-09 DIAGNOSIS — I447 Left bundle-branch block, unspecified: Secondary | ICD-10-CM | POA: Diagnosis present

## 2012-02-09 DIAGNOSIS — J449 Chronic obstructive pulmonary disease, unspecified: Secondary | ICD-10-CM | POA: Diagnosis present

## 2012-02-09 DIAGNOSIS — I10 Essential (primary) hypertension: Secondary | ICD-10-CM | POA: Diagnosis present

## 2012-02-09 DIAGNOSIS — I25810 Atherosclerosis of coronary artery bypass graft(s) without angina pectoris: Secondary | ICD-10-CM

## 2012-02-09 DIAGNOSIS — E785 Hyperlipidemia, unspecified: Secondary | ICD-10-CM | POA: Diagnosis present

## 2012-02-09 DIAGNOSIS — Z79899 Other long term (current) drug therapy: Secondary | ICD-10-CM

## 2012-02-09 DIAGNOSIS — I252 Old myocardial infarction: Secondary | ICD-10-CM

## 2012-02-09 DIAGNOSIS — Z23 Encounter for immunization: Secondary | ICD-10-CM

## 2012-02-09 DIAGNOSIS — Z951 Presence of aortocoronary bypass graft: Secondary | ICD-10-CM

## 2012-02-09 DIAGNOSIS — G4733 Obstructive sleep apnea (adult) (pediatric): Secondary | ICD-10-CM | POA: Diagnosis present

## 2012-02-09 DIAGNOSIS — E119 Type 2 diabetes mellitus without complications: Secondary | ICD-10-CM | POA: Diagnosis present

## 2012-02-09 DIAGNOSIS — I2510 Atherosclerotic heart disease of native coronary artery without angina pectoris: Secondary | ICD-10-CM | POA: Diagnosis present

## 2012-02-09 DIAGNOSIS — Z7982 Long term (current) use of aspirin: Secondary | ICD-10-CM

## 2012-02-09 DIAGNOSIS — I498 Other specified cardiac arrhythmias: Secondary | ICD-10-CM | POA: Diagnosis present

## 2012-02-09 DIAGNOSIS — L408 Other psoriasis: Secondary | ICD-10-CM | POA: Diagnosis present

## 2012-02-09 DIAGNOSIS — J4489 Other specified chronic obstructive pulmonary disease: Secondary | ICD-10-CM | POA: Diagnosis present

## 2012-02-09 DIAGNOSIS — Z87891 Personal history of nicotine dependence: Secondary | ICD-10-CM

## 2012-02-09 DIAGNOSIS — Z7902 Long term (current) use of antithrombotics/antiplatelets: Secondary | ICD-10-CM

## 2012-02-09 DIAGNOSIS — R0789 Other chest pain: Secondary | ICD-10-CM

## 2012-02-09 DIAGNOSIS — I442 Atrioventricular block, complete: Principal | ICD-10-CM | POA: Diagnosis present

## 2012-02-09 HISTORY — DX: Sleep apnea, unspecified: G47.30

## 2012-02-09 HISTORY — DX: Chronic obstructive pulmonary disease, unspecified: J44.9

## 2012-02-09 LAB — COMPREHENSIVE METABOLIC PANEL WITH GFR
ALT: 26 U/L (ref 0–53)
AST: 17 U/L (ref 0–37)
Albumin: 3.8 g/dL (ref 3.5–5.2)
Alkaline Phosphatase: 99 U/L (ref 39–117)
BUN: 28 mg/dL — ABNORMAL HIGH (ref 6–23)
CO2: 20 meq/L (ref 19–32)
Calcium: 9.9 mg/dL (ref 8.4–10.5)
Chloride: 98 meq/L (ref 96–112)
Creatinine, Ser: 1.22 mg/dL (ref 0.50–1.35)
GFR calc Af Amer: 71 mL/min — ABNORMAL LOW
GFR calc non Af Amer: 61 mL/min — ABNORMAL LOW
Glucose, Bld: 471 mg/dL — ABNORMAL HIGH (ref 70–99)
Potassium: 4.2 meq/L (ref 3.5–5.1)
Sodium: 133 meq/L — ABNORMAL LOW (ref 135–145)
Total Bilirubin: 0.5 mg/dL (ref 0.3–1.2)
Total Protein: 6.9 g/dL (ref 6.0–8.3)

## 2012-02-09 LAB — POCT I-STAT, CHEM 8
BUN: 31 mg/dL — ABNORMAL HIGH (ref 6–23)
Calcium, Ion: 1.26 mmol/L (ref 1.12–1.32)
Chloride: 103 meq/L (ref 96–112)
Creatinine, Ser: 1.2 mg/dL (ref 0.50–1.35)
Glucose, Bld: 510 mg/dL — ABNORMAL HIGH (ref 70–99)
HCT: 48 % (ref 39.0–52.0)
Hemoglobin: 16.3 g/dL (ref 13.0–17.0)
Potassium: 3.9 meq/L (ref 3.5–5.1)
Sodium: 136 meq/L (ref 135–145)
TCO2: 22 mmol/L (ref 0–100)

## 2012-02-09 LAB — CBC
HCT: 45.6 % (ref 39.0–52.0)
Hemoglobin: 16.5 g/dL (ref 13.0–17.0)
MCH: 32.7 pg (ref 26.0–34.0)
MCHC: 36.2 g/dL — ABNORMAL HIGH (ref 30.0–36.0)
MCV: 90.3 fL (ref 78.0–100.0)
Platelets: 311 10*3/uL (ref 150–400)
RBC: 5.05 MIL/uL (ref 4.22–5.81)
RDW: 12.9 % (ref 11.5–15.5)
WBC: 12.4 10*3/uL — ABNORMAL HIGH (ref 4.0–10.5)

## 2012-02-09 LAB — DIFFERENTIAL
Eosinophils Absolute: 0.1 10*3/uL (ref 0.0–0.7)
Eosinophils Relative: 1 % (ref 0–5)
Lymphs Abs: 2.7 10*3/uL (ref 0.7–4.0)
Monocytes Absolute: 1.1 10*3/uL — ABNORMAL HIGH (ref 0.1–1.0)

## 2012-02-09 LAB — PRO B NATRIURETIC PEPTIDE: Pro B Natriuretic peptide (BNP): 179.1 pg/mL — ABNORMAL HIGH (ref 0–125)

## 2012-02-09 LAB — POCT I-STAT TROPONIN I: Troponin i, poc: 0 ng/mL (ref 0.00–0.08)

## 2012-02-09 LAB — PROTIME-INR: INR: 0.96 (ref 0.00–1.49)

## 2012-02-09 MED ORDER — ATROPINE SULFATE 1 MG/ML IJ SOLN
INTRAMUSCULAR | Status: AC
  Administered 2012-02-09
  Filled 2012-02-09: qty 1

## 2012-02-09 MED ORDER — ASPIRIN 81 MG PO CHEW
324.0000 mg | CHEWABLE_TABLET | Freq: Once | ORAL | Status: AC
  Administered 2012-02-09: 324 mg via ORAL
  Filled 2012-02-09: qty 4

## 2012-02-09 MED ORDER — ATROPINE SULFATE 0.1 MG/ML IJ SOLN
INTRAMUSCULAR | Status: AC
  Filled 2012-02-09: qty 10

## 2012-02-09 MED ORDER — ASPIRIN 81 MG PO TABS: 325.0000 mg | ORAL_TABLET | Freq: Once | ORAL | Status: DC

## 2012-02-09 MED ORDER — ASPIRIN 81 MG PO TABS: 243.0000 mg | ORAL_TABLET | Freq: Once | ORAL | Status: DC

## 2012-02-09 MED ORDER — SODIUM CHLORIDE 0.9 % IV BOLUS (SEPSIS)
500.0000 mL | Freq: Once | INTRAVENOUS | Status: AC
  Administered 2012-02-09: 500 mL via INTRAVENOUS

## 2012-02-09 MED ORDER — NITROGLYCERIN 0.4 MG SL SUBL
0.4000 mg | SUBLINGUAL_TABLET | Freq: Once | SUBLINGUAL | Status: DC
  Filled 2012-02-09: qty 25

## 2012-02-09 NOTE — ED Notes (Signed)
Alert, NAD, calm, interactive, skin W&D, resps e/u, speaking in clear complete sentences, card MD present, cath tech/RN into room, HR currently 106 (up from 28, after atropine), pt speaking with son on phone, has called family x2, RRT RN present, pt denies pain, admits to sob & weakness. VSS.
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
## 2012-02-09 NOTE — ED Notes (Signed)
Robin, EMT ran another EKG due to HR change

## 2012-02-09 NOTE — ED Provider Notes (Signed)
History     CSN: [id number]  Arrival date & time 02/09/12  2058   First MD Initiated Contact with Patient 02/09/12 2113      Chief Complaint  Patient presents with  . Chest Pain    Location-chest/radiation-none/quality-dull/duration-hours/timing-intermittent/severity-mild/associated sxs-see ROS/No prior treatment) Patient is a 66 y.o. male presenting with chest pain. The history is provided by the patient and a relative. No language interpreter was used.  Chest Pain The chest pain began 3 - 5 hours ago. Chest pain occurs intermittently. The chest pain is resolved. The pain is associated with exertion. At its most intense, the pain is at 8/10. The pain is currently at 0/10. The severity of the pain is moderate. The quality of the pain is described as dull. The pain does not radiate. Chest pain is worsened by exertion. Primary symptoms include shortness of breath. Pertinent negatives for primary symptoms include no fever, no fatigue, no syncope, no cough, no wheezing, no palpitations, no abdominal pain, no nausea, no vomiting and no dizziness.  Associated symptoms include weakness.  Pertinent negatives for associated symptoms include no lower extremity edema, no near-syncope, no numbness, no orthopnea and no paroxysmal nocturnal dyspnea. He tried nothing for the symptoms. Risk factors include male gender.  His past medical history is significant for CAD.  Pertinent negatives for past medical history include no seizures.  Procedure history is positive for cardiac catheterization and echocardiogram.     Past Medical History  Diagnosis Date  . Coronary artery disease   . Myocardial infarction   . Psoriasis   . Hypertension   . Diabetes mellitus     Past Surgical History  Procedure Date  . Coronary artery bypass graft     No family history on file.  History  Substance Use Topics  . Smoking status: Former Games developer  . Smokeless tobacco: Not on file  . Alcohol Use: No       Review of Systems  Constitutional: Negative for fever, chills and fatigue.  HENT: Negative for neck pain and neck stiffness.   Eyes: Negative for visual disturbance.  Respiratory: Positive for shortness of breath. Negative for cough, chest tightness and wheezing.   Cardiovascular: Positive for chest pain. Negative for palpitations, orthopnea, leg swelling, syncope and near-syncope.  Gastrointestinal: Negative for nausea, vomiting, abdominal pain, diarrhea, constipation, blood in stool and abdominal distention.  Genitourinary: Negative for dysuria, urgency, hematuria and difficulty urinating.  Musculoskeletal: Negative for back pain and gait problem.  Skin: Negative for rash.  Neurological: Positive for weakness. Negative for dizziness, tremors, seizures, syncope, facial asymmetry, speech difficulty, light-headedness, numbness and headaches.  Hematological: Negative for adenopathy. Does not bruise/bleed easily.  Psychiatric/Behavioral: Negative for confusion.    Allergies  Review of patient's allergies indicates no known allergies.  Home Medications   Current Outpatient Rx  Name Route Sig Dispense Refill  . ALBUTEROL SULFATE HFA 108 (90 BASE) MCG/ACT IN AERS Inhalation Inhale 2 puffs into the lungs every 6 (six) hours as needed. For shortness of breath     . ASPIRIN 81 MG PO TABS Oral Take 81 mg by mouth every morning.     . ATORVASTATIN CALCIUM 40 MG PO TABS Oral Take 40 mg by mouth at bedtime.     . CLOPIDOGREL BISULFATE 75 MG PO TABS Oral Take 75 mg by mouth every morning.     . ISOSORBIDE MONONITRATE ER 30 MG PO TB24 Oral Take 30 mg by mouth daily.      [name] LISINOPRIL 10 MG  PO TABS Oral Take 10 mg by mouth daily as needed. For hypertension    . METFORMIN HCL 500 MG PO TABS Oral Take 500 mg by mouth 2 (two) times daily with a meal.      . METOPROLOL TARTRATE 100 MG PO TABS Oral Take 100 mg by mouth 2 (two) times daily.      [name] NITROGLYCERIN 0.4 MG SL SUBL Sublingual Place 0.4  mg under the tongue every 5 (five) minutes as needed. For chest pain    . PANTOPRAZOLE SODIUM 40 MG PO TBEC Oral Take 40 mg by mouth every morning.      BP 124/46  Pulse 36  Temp(Src) 97.6 F (36.4 C) (Oral)  Resp 19  SpO2 96%  Physical Exam  Constitutional: He is oriented to person, place, and time. He appears well-developed and well-nourished. No distress.  HENT:  Head: Normocephalic.  Eyes: Conjunctivae are normal.  Neck: Normal range of motion. Neck supple. No JVD present.  Cardiovascular: Normal heart sounds and intact distal pulses.  An irregular rhythm present. Bradycardia present.   No murmur heard. Pulmonary/Chest: Effort normal and breath sounds normal. No respiratory distress. He has no wheezes. He has no rales. He exhibits no tenderness.  Abdominal: Soft. Bowel sounds are normal. He exhibits no distension and no mass. There is no tenderness. There is no rebound and no guarding.  Musculoskeletal: Normal range of motion. He exhibits no edema and no tenderness.  Neurological: He is alert and oriented to person, place, and time.  Skin: Skin is warm and dry. He is not diaphoretic.  Psychiatric: He has a normal mood and affect.    ED Course  Procedures (including critical care time)  Labs Reviewed  CBC - Abnormal; Notable for the following:    WBC 12.4 (*)    MCHC 36.2 (*)    All other components within normal limits  DIFFERENTIAL - Abnormal; Notable for the following:    Neutro Abs 8.5 (*)    Monocytes Absolute 1.1 (*)    All other components within normal limits  COMPREHENSIVE METABOLIC PANEL - Abnormal; Notable for the following:    Sodium 133 (*)    Glucose, Bld 471 (*)    BUN 28 (*)    GFR calc non Af Amer 61 (*)    GFR calc Af Amer 71 (*)    All other components within normal limits  PRO B NATRIURETIC PEPTIDE - Abnormal; Notable for the following:    Pro B Natriuretic peptide (BNP) 179.1 (*)    All other components within normal limits  POCT I-STAT, CHEM 8  - Abnormal; Notable for the following:    BUN 31 (*)    Glucose, Bld 510 (*)    All other components within normal limits  PROTIME-INR  POCT I-STAT TROPONIN I   Dg Chest Portable 1 View  02/09/2012  *RADIOLOGY REPORT*  Clinical Data: Chest pain and shortness of breath.  PORTABLE CHEST - 1 VIEW  Comparison: Chest radiograph performed 10/15/2011  Findings: The lungs are well-aerated and clear.  There is no evidence of focal opacification, pleural effusion or pneumothorax.  The cardiomediastinal silhouette is normal in size; the patient is status post median sternotomy, with evidence of prior CABG. Calcification is noted within the aortic arch.  No acute osseous abnormalities are seen.  IMPRESSION: No acute cardiopulmonary process seen.  Original Report Authenticated By: Tonia Ghent, M.D.     1. Chest pressure   2. Complete heart block, transient  3. LBBB (left bundle branch block)     Date: 02/10/2012  Rate: 36  Rhythm: complete heart block  QRS Axis: normal  Intervals: QT prolonged  ST/T Wave abnormalities: nonspecific ST changes  Conduction Disutrbances:complete heart block  Narrative Interpretation:   Old EKG Reviewed: changes notedLBBB on prior EKG   MDM  Pt is a 66yo M with PMH of CABG and LBBB who presents in complete heart block rate 36 after experiencing chest pressure while doing yard work. BP stable. Pt converted spontaneously back to baseline LBBB rate 98. Denied complaints for me on exam but later stated chest pressure to RN but refused SL nitro. Labs and CXR unremarkable. Repeat episode of CHB with syncope and 20 sec episode of no ventricular activity so cardiology re-consulted with plans to see right away. BP stable. Atropine given. Taken to cath lab urgently by Dr. Allyson Sabal.         Consuello Masse, MD 02/10/12 [phone]

## 2012-02-09 NOTE — ED Notes (Signed)
Pt states continued chest pressure (not pain) of 6/10.  Dr March Rummage notified.

## 2012-02-09 NOTE — ED Notes (Signed)
PT. REPORTS CHEST TIGHTNESS WITH SLOW HEART RATE ONSET THIS EVENING , SLIGHT SOB . DENIES NAUSEA OR DIAPHORESIS.

## 2012-02-10 ENCOUNTER — Encounter (HOSPITAL_COMMUNITY): Payer: Self-pay | Admitting: *Deleted

## 2012-02-10 ENCOUNTER — Encounter (HOSPITAL_COMMUNITY)
Admission: EM | Disposition: A | Discharge status: Home / Self Care | Payer: Self-pay | Source: Home / Self Care | Attending: Cardiovascular Disease

## 2012-02-10 DIAGNOSIS — I442 Atrioventricular block, complete: Secondary | ICD-10-CM | POA: Diagnosis present

## 2012-02-10 HISTORY — PX: CARDIAC CATHETERIZATION: SHX172

## 2012-02-10 HISTORY — PX: LEFT HEART CATHETERIZATION WITH CORONARY/GRAFT ANGIOGRAM: SHX5450

## 2012-02-10 HISTORY — PX: TEMPORARY PACEMAKER INSERTION: SHX5471

## 2012-02-10 LAB — GLUCOSE, CAPILLARY
Glucose-Capillary: 242 mg/dL — ABNORMAL HIGH (ref 70–99)
Glucose-Capillary: 248 mg/dL — ABNORMAL HIGH (ref 70–99)
Glucose-Capillary: 236 mg/dL — ABNORMAL HIGH (ref 70–99)

## 2012-02-10 SURGERY — TEMPORARY PACEMAKER INSERTION
Anesthesia: LOCAL | Laterality: Bilateral

## 2012-02-10 MED ORDER — CLOPIDOGREL BISULFATE 75 MG PO TABS: 75.0000 mg | ORAL_TABLET | ORAL | Status: DC

## 2012-02-10 MED ORDER — CEFAZOLIN SODIUM 1-5 GM-% IV SOLN
1.0000 g | INTRAVENOUS | Status: DC
  Filled 2012-02-10: qty 50

## 2012-02-10 MED ORDER — CLOPIDOGREL BISULFATE 75 MG PO TABS
75.0000 mg | ORAL_TABLET | Freq: Every day | ORAL | Status: DC
  Administered 2012-02-10 – 2012-02-11 (×2): 75 mg via ORAL
  Filled 2012-02-10 (×3): qty 1

## 2012-02-10 MED ORDER — PNEUMOCOCCAL VAC POLYVALENT 25 MCG/0.5ML IJ INJ
0.5000 mL | INJECTION | INTRAMUSCULAR | Status: AC
  Filled 2012-02-10: qty 0.5

## 2012-02-10 MED ORDER — ASPIRIN EC 325 MG PO TBEC
325.0000 mg | DELAYED_RELEASE_TABLET | Freq: Every day | ORAL | Status: DC
  Administered 2012-02-10 – 2012-02-11 (×2): 325 mg via ORAL
  Filled 2012-02-10 (×2): qty 1

## 2012-02-10 MED ORDER — ONDANSETRON HCL 4 MG/2ML IJ SOLN: 4.0000 mg | Freq: Four times a day (QID) | INTRAMUSCULAR | Status: DC | PRN

## 2012-02-10 MED ORDER — SODIUM CHLORIDE 0.9 % IV SOLN
INTRAVENOUS | Status: DC
  Administered 2012-02-11: 50 mL/h via INTRAVENOUS

## 2012-02-10 MED ORDER — BIOTENE DRY MOUTH MT LIQD
15.0000 mL | Freq: Two times a day (BID) | OROMUCOSAL | Status: DC
  Administered 2012-02-10 – 2012-02-11 (×4): 15 mL via OROMUCOSAL

## 2012-02-10 MED ORDER — CHLORHEXIDINE GLUCONATE 4 % EX LIQD
60.0000 mL | Freq: Once | CUTANEOUS | Status: AC
  Administered 2012-02-11: 4 via TOPICAL
  Filled 2012-02-10: qty 60

## 2012-02-10 MED ORDER — MORPHINE SULFATE 2 MG/ML IJ SOLN: 1.0000 mg | INTRAMUSCULAR | Status: DC | PRN

## 2012-02-10 MED ORDER — CHLORHEXIDINE GLUCONATE 4 % EX LIQD
60.0000 mL | Freq: Once | CUTANEOUS | Status: AC
  Administered 2012-02-10: 4 via TOPICAL
  Filled 2012-02-10: qty 60

## 2012-02-10 MED ORDER — SODIUM CHLORIDE 0.9 % IV SOLN
INTRAVENOUS | Status: DC
  Administered 2012-02-10: 20 mL/h via INTRAVENOUS

## 2012-02-10 MED ORDER — INSULIN ASPART 100 UNIT/ML ~~LOC~~ SOLN
0.0000 [IU] | Freq: Three times a day (TID) | SUBCUTANEOUS | Status: DC
  Administered 2012-02-10: 5 [IU] via SUBCUTANEOUS
  Administered 2012-02-10: 8 [IU] via SUBCUTANEOUS
  Administered 2012-02-10: 5 [IU] via SUBCUTANEOUS
  Administered 2012-02-11: 8 [IU] via SUBCUTANEOUS

## 2012-02-10 MED ORDER — LIVING WELL WITH DIABETES BOOK
Freq: Once | Status: AC
  Administered 2012-02-10: 12:00:00
  Filled 2012-02-10: qty 1

## 2012-02-10 MED ORDER — SODIUM CHLORIDE 0.45 % IV SOLN
INTRAVENOUS | Status: DC
  Administered 2012-02-11: 50 mL/h via INTRAVENOUS

## 2012-02-10 MED ORDER — LIDOCAINE HCL (PF) 1 % IJ SOLN
INTRAMUSCULAR | Status: AC
  Filled 2012-02-10: qty 30

## 2012-02-10 MED ORDER — NITROGLYCERIN 0.4 MG SL SUBL: 0.4000 mg | SUBLINGUAL_TABLET | SUBLINGUAL | Status: DC | PRN

## 2012-02-10 MED ORDER — LISINOPRIL 10 MG PO TABS
10.0000 mg | ORAL_TABLET | Freq: Every day | ORAL | Status: DC
  Administered 2012-02-10 – 2012-02-11 (×2): 10 mg via ORAL
  Filled 2012-02-10 (×3): qty 1

## 2012-02-10 MED ORDER — ATORVASTATIN CALCIUM 40 MG PO TABS
40.0000 mg | ORAL_TABLET | Freq: Every day | ORAL | Status: DC
  Administered 2012-02-10 – 2012-02-11 (×2): 40 mg via ORAL
  Filled 2012-02-10 (×3): qty 1

## 2012-02-10 MED ORDER — METFORMIN HCL 500 MG PO TABS
500.0000 mg | ORAL_TABLET | Freq: Two times a day (BID) | ORAL | Status: DC
  Filled 2012-02-10 (×3): qty 1

## 2012-02-10 MED ORDER — SODIUM CHLORIDE 0.9 % IV SOLN
INTRAVENOUS | Status: AC
  Administered 2012-02-10: 02:00:00 via INTRAVENOUS
  Administered 2012-02-10: 75 mL/h via INTRAVENOUS

## 2012-02-10 MED ORDER — NITROGLYCERIN 0.2 MG/ML ON CALL CATH LAB
INTRAVENOUS | Status: AC
  Filled 2012-02-10: qty 1

## 2012-02-10 MED ORDER — ISOSORBIDE MONONITRATE ER 30 MG PO TB24
30.0000 mg | ORAL_TABLET | Freq: Every day | ORAL | Status: DC
  Administered 2012-02-10 – 2012-02-11 (×2): 30 mg via ORAL
  Filled 2012-02-10 (×3): qty 1

## 2012-02-10 MED ORDER — ASPIRIN 81 MG PO TABS: 81.0000 mg | ORAL_TABLET | Freq: Every day | ORAL | Status: DC

## 2012-02-10 MED ORDER — METFORMIN HCL 500 MG PO TABS
500.0000 mg | ORAL_TABLET | Freq: Two times a day (BID) | ORAL | Status: DC
Start: 2012-02-10 — End: 2012-02-10

## 2012-02-10 MED ORDER — PANTOPRAZOLE SODIUM 40 MG PO TBEC
40.0000 mg | DELAYED_RELEASE_TABLET | Freq: Every day | ORAL | Status: DC
  Administered 2012-02-10 – 2012-02-11 (×2): 40 mg via ORAL
  Filled 2012-02-10 (×2): qty 1

## 2012-02-10 MED ORDER — ACETAMINOPHEN 325 MG PO TABS: 650.0000 mg | ORAL_TABLET | ORAL | Status: DC | PRN

## 2012-02-10 MED ORDER — SODIUM CHLORIDE 0.9 % IR SOLN
80.0000 mg | Status: DC
  Filled 2012-02-10: qty 2

## 2012-02-10 MED ORDER — HEPARIN (PORCINE) IN NACL 2-0.9 UNIT/ML-% IJ SOLN
INTRAMUSCULAR | Status: AC
  Filled 2012-02-10: qty 2000

## 2012-02-10 NOTE — ED Notes (Signed)
Cath lab ready, to CL now, no changes.

## 2012-02-10 NOTE — ED Notes (Signed)
Dr. Allyson Sabal speaking with pt, HR 101.

## 2012-02-10 NOTE — Progress Notes (Signed)
Pt. Seen and examined. Agree with the NP/PA-C note as written.  Hemodynamically stable - not currently pacing. He has had intermittent complete heart block without new ischemia by cath. Will try to arrange for pacemaker with Dr. Royann Shivers tomorrow morning. Keep NPO p MN.  Chrystie Nose, MD, [facility] Attending Cardiologist [facility]

## 2012-02-10 NOTE — H&P (Signed)
   Pt was reexamined and existing H & P reviewed. No changes found.  Runell Gess, MD [facility] 02/10/2012 12:27 AM

## 2012-02-10 NOTE — Progress Notes (Signed)
Inpatient Diabetes Program Recommendations  AACE/ADA: New Consensus Statement on Inpatient Glycemic Control (2009)  Target Ranges:  Prepandial:   less than 140 mg/dL      Peak postprandial:   less than 180 mg/dL (1-2 hours)      Critically ill patients:  140 - 180 mg/dL   Results for DUANE, EARNSHAW (MRN [id number]) as of 02/10/2012 11:21  Ref. Range 02/10/2012 01:29 02/10/2012 07:28  Glucose-Capillary Latest Range: 70-99 mg/dL 829 (H) 562 (H)    Inpatient Diabetes Program Recommendations Insulin - Basal: start Lantus 15 units HgbA1C: =11.5 10/16/11  Last A1C=11.5  In December 2012.  Recommend ordering a new A1C.  Home medications will need adjustment/addition by primary MD.   Will continue to follow during this admission.  Thank you  Piedad Climes [facility] Inpatient Diabetes Coordinator [phone]

## 2012-02-10 NOTE — H&P (Signed)
[name] is an 66 y.o. male.   Chief Complaint: "Blacking out while driving" HPI: 66 year old male with a history of coronary artery disease including bypass grafting in 2007 with sequential vein graft to the first and second obtuse marginal branches of the left circumflex coronary artery and a saphenous vein graft to the posterior lateral branch of the left circumflex coronary artery. Patient's last heart cath was February 2011 revealing EF of 45-55% and loss of the vein graft to the PDA but the native PDA had excellent flow. The LAD had no disease. Last nuclear stress test was in December 2012 which was a low risk scan. His chest pain and he was admitted with at that time secondary to obstructive sleep apnea not wearing CPAP.  Patient presented on the evening a 02/09/2012 22 blacking out for several seconds while driving.  Fortunately he was at a stoplight when this happened.  He was short of breath when he became conscious. In the emergency room upon arrival his heart rate was in the upper 30s.  He sat up for chest x-ray and his heart rate increased to 105. Prior to this episode that led to ER visit patient had been active planting flowers on the day of admission and felt chest tightness as well.  While in the emergency room patient continued with chest pressure. On EKG upon arrival he was found to be in complete heart block that was transient additionally has a left bundle branch block. On initial presentation heart rate was 36 and converted spontaneously back to sinus rhythm at 98 then a second episode of complete heart block with syncope in the emergency room and 20 seconds of asystole.  With previous syncope and repeat syncope with a heart rate of 20 and complete heart block and history of coronary artery disease and chest pain patient was taken emergently to the cath lab where he Will have to havecardiac catheterization and temp. pacemaker placed by Dr. Allyson Sabal.  Then he will be admitted to the cardiac  intensive care unit.  See below for other history.    Past Medical History  Diagnosis Date  . Coronary artery disease   . Myocardial infarction   . Psoriasis   . Hypertension   . Diabetes mellitus   . COPD (chronic obstructive pulmonary disease)   . Sleep apnea     c pap at home, does not use    Past Surgical History  Procedure Date  . Coronary artery bypass graft   . Cardiac catheterization     History reviewed. No pertinent family history. Social History:  reports that he quit smoking about 6 years ago. His smoking use included Cigars. He has never used smokeless tobacco. He reports that he does not drink alcohol or use illicit drugs.  Allergies: No Known Allergies  Medications Prior to Admission  Medication Dose Route Frequency Provider Last Rate Last Dose  . 0.9 %  sodium chloride infusion   Intravenous Continuous Runell Gess, MD 75 mL/hr at 02/10/12 0340 75 mL/hr at 02/10/12 0340  . 0.9 %  sodium chloride infusion   Intravenous Continuous Runell Gess, MD 20 mL/hr at 02/10/12 0503 20 mL/hr at 02/10/12 0503  . acetaminophen (TYLENOL) tablet 650 mg  650 mg Oral Q4H PRN Runell Gess, MD      . antiseptic oral rinse (BIOTENE) solution 15 mL  15 mL Mouth Rinse BID Runell Gess, MD   15 mL at 02/10/12 0800  . aspirin chewable  tablet 324 mg  324 mg Oral Once Ethelda Chick, MD   324 mg at 02/09/12 2211  . aspirin EC tablet 325 mg  325 mg Oral Daily Runell Gess, MD   325 mg at 02/10/12 1144  . atorvastatin (LIPITOR) tablet 40 mg  40 mg Oral QHS Runell Gess, MD      . atropine 1 MG/ML injection           . ceFAZolin (ANCEF) IVPB 1 g/50 mL premix  1 g Intravenous On Call Dwana Melena, PA      . clopidogrel (PLAVIX) tablet 75 mg  75 mg Oral Q breakfast Runell Gess, MD   75 mg at 02/10/12 0802  . gentamicin (GARAMYCIN) 80 mg in sodium chloride irrigation 0.9 % 500 mL irrigation  80 mg Irrigation On Call Dwana Melena, PA      . heparin 2-0.9  UNIT/ML-% infusion           . insulin aspart (novoLOG) injection 0-15 Units  0-15 Units Subcutaneous TID WC Dwana Melena, PA   5 Units at 02/10/12 1144  . isosorbide mononitrate (IMDUR) 24 hr tablet 30 mg  30 mg Oral Daily Runell Gess, MD   30 mg at 02/10/12 1144  . lidocaine (XYLOCAINE) 1 % injection           . lisinopril (PRINIVIL,ZESTRIL) tablet 10 mg  10 mg Oral Daily Runell Gess, MD   10 mg at 02/10/12 1145  . living well with diabetes book MISC   Does not apply Once Runell Gess, MD      . metFORMIN (GLUCOPHAGE) tablet 500 mg  500 mg Oral BID WC Runell Gess, MD      . morphine 2 MG/ML injection 1 mg  1 mg Intravenous Q1H PRN Runell Gess, MD      . nitroGLYCERIN (NITROSTAT) SL tablet 0.4 mg  0.4 mg Sublingual Q5 min PRN Runell Gess, MD      . nitroGLYCERIN (NTG ON-CALL) 0.2 mg/mL injection           . ondansetron (ZOFRAN) injection 4 mg  4 mg Intravenous Q6H PRN Runell Gess, MD      . pantoprazole (PROTONIX) EC tablet 40 mg  40 mg Oral Q1200 Runell Gess, MD   40 mg at 02/10/12 1144  . pneumococcal 23 valent vaccine (PNU-IMMUNE) injection 0.5 mL  0.5 mL Intramuscular Tomorrow-1000 Runell Gess, MD      . sodium chloride 0.9 % bolus 500 mL  500 mL Intravenous Once Consuello Masse, MD   500 mL at 02/09/12 2211  . DISCONTD: aspirin tablet 243 mg  243 mg Oral Once Consuello Masse, MD      . DISCONTD: aspirin tablet 325 mg  325 mg Oral Once Consuello Masse, MD      . DISCONTD: aspirin tablet 81 mg  81 mg Oral Daily Runell Gess, MD      . DISCONTD: atropine 0.1 MG/ML injection           . DISCONTD: clopidogrel (PLAVIX) tablet 75 mg  75 mg Oral BH-q7a Runell Gess, MD      . DISCONTD: metFORMIN (GLUCOPHAGE) tablet 500 mg  500 mg Oral BID WC Runell Gess, MD      . DISCONTD: nitroGLYCERIN (NITROSTAT) SL tablet 0.4 mg  0.4 mg Sublingual Once Consuello Masse, MD  Medications Prior to Admission  Medication Sig Dispense Refill  . albuterol  (PROVENTIL HFA;VENTOLIN HFA) 108 (90 BASE) MCG/ACT inhaler Inhale 2 puffs into the lungs every 6 (six) hours as needed. For shortness of breath       . aspirin 81 MG tablet Take 81 mg by mouth every morning.       [name] atorvastatin (LIPITOR) 40 MG tablet Take 40 mg by mouth at bedtime.       . clopidogrel (PLAVIX) 75 MG tablet Take 75 mg by mouth every morning.       . isosorbide mononitrate (IMDUR) 30 MG 24 hr tablet Take 30 mg by mouth daily.        [name] lisinopril (PRINIVIL,ZESTRIL) 10 MG tablet Take 10 mg by mouth daily as needed. For hypertension      . metFORMIN (GLUCOPHAGE) 500 MG tablet Take 500 mg by mouth 2 (two) times daily with a meal.        . metoprolol (LOPRESSOR) 100 MG tablet Take 100 mg by mouth 2 (two) times daily.        . nitroGLYCERIN (NITROSTAT) 0.4 MG SL tablet Place 0.4 mg under the tongue every 5 (five) minutes as needed. For chest pain        Results for orders placed during the hospital encounter of 02/09/12 (from the past 48 hour(s))  CBC     Status: Abnormal   Collection Time   02/09/12  9:13 PM      Component Value Range Comment   WBC 12.4 (*) 4.0 - 10.5 (K/uL)    RBC 5.05  4.22 - 5.81 (MIL/uL)    Hemoglobin 16.5  13.0 - 17.0 (g/dL)    HCT 16.1  09.6 - 04.5 (%)    MCV 90.3  78.0 - 100.0 (fL)    MCH 32.7  26.0 - 34.0 (pg)    MCHC 36.2 (*) 30.0 - 36.0 (g/dL)    RDW 40.9  81.1 - 91.4 (%)    Platelets 311  150 - 400 (K/uL)   DIFFERENTIAL     Status: Abnormal   Collection Time   02/09/12  9:13 PM      Component Value Range Comment   Neutrophils Relative 68  43 - 77 (%)    Lymphocytes Relative 22  12 - 46 (%)    Monocytes Relative 9  3 - 12 (%)    Eosinophils Relative 1  0 - 5 (%)    Basophils Relative 0  0 - 1 (%)    Neutro Abs 8.5 (*) 1.7 - 7.7 (K/uL)    Lymphs Abs 2.7  0.7 - 4.0 (K/uL)    Monocytes Absolute 1.1 (*) 0.1 - 1.0 (K/uL)    Eosinophils Absolute 0.1  0.0 - 0.7 (K/uL)    Basophils Absolute 0.0  0.0 - 0.1 (K/uL)    Smear Review MORPHOLOGY  UNREMARKABLE     COMPREHENSIVE METABOLIC PANEL     Status: Abnormal   Collection Time   02/09/12  9:13 PM      Component Value Range Comment   Sodium 133 (*) 135 - 145 (mEq/L)    Potassium 4.2  3.5 - 5.1 (mEq/L)    Chloride 98  96 - 112 (mEq/L)    CO2 20  19 - 32 (mEq/L)    Glucose, Bld 471 (*) 70 - 99 (mg/dL)    BUN 28 (*) 6 - 23 (mg/dL)    Creatinine, Ser 7.82  0.50 - 1.35 (mg/dL)  Calcium 9.9  8.4 - 10.5 (mg/dL)    Total Protein 6.9  6.0 - 8.3 (g/dL)    Albumin 3.8  3.5 - 5.2 (g/dL)    AST 17  0 - 37 (U/L)    ALT 26  0 - 53 (U/L)    Alkaline Phosphatase 99  39 - 117 (U/L)    Total Bilirubin 0.5  0.3 - 1.2 (mg/dL)    GFR calc non Af Amer 61 (*) >90 (mL/min)    GFR calc Af Amer 71 (*) >90 (mL/min)   PROTIME-INR     Status: Normal   Collection Time   02/09/12  9:13 PM      Component Value Range Comment   Prothrombin Time 13.0  11.6 - 15.2 (seconds)    INR 0.96  0.00 - 1.49    PRO B NATRIURETIC PEPTIDE     Status: Abnormal   Collection Time   02/09/12  9:13 PM      Component Value Range Comment   Pro B Natriuretic peptide (BNP) 179.1 (*) 0 - 125 (pg/mL)   POCT I-STAT TROPONIN I     Status: Normal   Collection Time   02/09/12 10:27 PM      Component Value Range Comment   Troponin i, poc 0.00  0.00 - 0.08 (ng/mL)    Comment 3            POCT I-STAT, CHEM 8     Status: Abnormal   Collection Time   02/09/12 10:29 PM      Component Value Range Comment   Sodium 136  135 - 145 (mEq/L)    Potassium 3.9  3.5 - 5.1 (mEq/L)    Chloride 103  96 - 112 (mEq/L)    BUN 31 (*) 6 - 23 (mg/dL)    Creatinine, Ser 1.19  0.50 - 1.35 (mg/dL)    Glucose, Bld 147 (*) 70 - 99 (mg/dL)    Calcium, Ion 8.29  1.12 - 1.32 (mmol/L)    TCO2 22  0 - 100 (mmol/L)    Hemoglobin 16.3  13.0 - 17.0 (g/dL)    HCT 56.2  13.0 - 86.5 (%)   GLUCOSE, CAPILLARY     Status: Abnormal   Collection Time   02/10/12  1:29 AM      Component Value Range Comment   Glucose-Capillary 290 (*) 70 - 99 (mg/dL)   MRSA PCR  SCREENING     Status: Normal   Collection Time   02/10/12  2:26 AM      Component Value Range Comment   MRSA by PCR NEGATIVE  NEGATIVE    GLUCOSE, CAPILLARY     Status: Abnormal   Collection Time   02/10/12  7:28 AM      Component Value Range Comment   Glucose-Capillary 242 (*) 70 - 99 (mg/dL)    Comment 1 Documented in Chart      Comment 2 Notify RN     GLUCOSE, CAPILLARY     Status: Abnormal   Collection Time   02/10/12 11:43 AM      Component Value Range Comment   Glucose-Capillary 248 (*) 70 - 99 (mg/dL)    Dg Chest Portable 1 View  02/09/2012  *RADIOLOGY REPORT*  Clinical Data: Chest pain and shortness of breath.  PORTABLE CHEST - 1 VIEW  Comparison: Chest radiograph performed 10/15/2011  Findings: The lungs are well-aerated and clear.  There is no evidence of focal opacification, pleural effusion or pneumothorax.  The  cardiomediastinal silhouette is normal in size; the patient is status post median sternotomy, with evidence of prior CABG. Calcification is noted within the aortic arch.  No acute osseous abnormalities are seen.  IMPRESSION: No acute cardiopulmonary process seen.  Original Report Authenticated By: Tonia Ghent, M.D.    ROS: General:some weakness complaints and syncope today the day of admission no colds or fevers Skin:no rash or ulcers HEENT:no blurred vision or double vision no sinus WU:JWJXB pain see history of present illness JYN:WGNFA of breath but no cough or wheezing GI:no diarrhea constipation or melena GU:no hematuria or dysuria MS:no joint pain or back pain Neuro:syncope as described previously Endo:patient is diabetic no thyroid issues   Blood pressure 93/54, pulse 78, temperature 98.4 F (36.9 C), temperature source Oral, resp. rate 14, height 5\' 10"  (1.778 m), weight 75 kg (165 lb 5.5 oz), SpO2 95.00%. PE: General:alert and oriented when his heart rate is stable, pleasant affect, with recurrent syncope more anxious Skin:warm and dry, brisk capillary  refill HEENT:normocephalic, sclera clear Neck:supple no JVD no carotid bruits Heart:S1-S2, currently regular rate and rhythm, soft systolic murmur at the left sternal border. Lungs:clear without rales rhonchi or wheezes OZH:YQMVHQIO bowel sounds, soft nontender no palpate liver spleen or masses Ext:no edema pedal pulses are 1+ Neuro: alert and oriented on exam, X 3, MAE, follows commands Initial EKG: bradycardic at 36 with complete heart block left bundle branch block which is chronic Second EKG:  sinus tach at 106 with first degree AV block and left bundle branch block Third EKG: complete heart block with junctional escape rhythm now with right bundle branch block and left posterior fascicular block.  Rhythm strip reveals ventricular asystole for 20 seconds with atrial complexes. Patient was symptomatic with syncope at this time     Assessment/Plan Patient Active Problem List  Diagnoses  . DM, Type 2 NIDDM  . OBSTRUCTIVE SLEEP APNEA, non compliant with C-Pap  . CHRONIC OBSTRUCTIVE PULMONARY DISEASE  . DYSPNEA ON EXERTION  . Myocardial infarction  . Psoriasis  . CAD (coronary artery disease) of bypass graft, CABG 2007, cath Feb 2011  . PAF (paroxysmal atrial fibrillation), history of PAF in past, in NSR on adm  . LBBB (left bundle branch block)  . HTN (hypertension)  . Dyslipidemia  . Chest pain  . Complete heart block    Plan: as stated patient was taken from the emergency room to the Cath Lab for emergency cardiac catheterization to rule out ischemia as cause of bradycardia and then placement of temporary pacemaker was planned.  Patient will then be admitted to the cardiac intensive care unit with serial cardiac enzymes. Additionally plans  for permanent pacemaker placement will be done on Thursday, April 18.   Blood Berry saw and examined the patient and these were his results.  INGOLD,LAURA R 02/10/2012, 4:46 PM

## 2012-02-10 NOTE — CV Procedure (Signed)
[name] is a 66 y.o. male    [id number] LOCATION:  FACILITY: [facility]  PHYSICIAN: Nanetta Batty, M.D. [birth year]   DATE OF PROCEDURE:  02/10/2012  DATE OF DISCHARGE:  [facility]  CARDIAC CATHETERIZATION     History obtained from chart review. [name] is a 66 year old married Caucasian male with history of CAD status post bypass grafting x3 in 2007. His cardiologist is Dr. Hedwig Morton his problems include dyslipidemia, hypertension, chronic left bundle branch block and history of paroxysmal atrial fibrillation in the past. He has chronic obstructive pulmonary disease as well as obstructive sleep apnea noncompliant with CPAP. He is type 2 diabetes. His last cath in 2000 [name] I will revealing a patent sequential graft to OM1 and 2 which appears stented. His LAD was not grafted. He had an occluded graft to a posterolateral branch off the circumflex however the native PLA was patent. He just saw Dr. Cliffton Asters January and had been doing well since that time. Today while driving he had a brief episode of syncope he has had some chest pressure. He noted his heart rate was slow. He came in subcostal ER where he was found to be in complete heart block with ventricular escape rhythm in the 30s. He was treated with atropine and he converted back to sinus rhythm with left bundle. He a recurrent episode of asystole lasting 20 seconds which converted back to complete heart block with ventricular escape and ultimately sinus rhythm. He presents now for cardiac catheterization to rule out ischemic etiology to his complete heart block and placement of a temperature to speak pacemaker in anticipation of a permanent transvenous pacemaker.   PROCEDURE DESCRIPTION:    The patient was brought to the second floor  [facility] cath lab in the postabsorptive state. He was not  premedicated . His right groin was prepped and shaved in usual sterile fashion. Xylocaine 1% was used  for  local anesthesia. A 6 French sheath was inserted into the right common femoral  artery using standard Seldinger technique.a 6 French sheath was inserted in the right common femoral vein. A 5 French balloon tip temperatures his pacemaker was then advanced into the right ventricular apex and capture was demonstrated at 2 MA. 6 French right and left Judkins diagnostic catheters were used for selective coronary angiography. Left ventriculography was not performed. Visipaque dye was used for the entirety of the case.    HEMODYNAMICS:    AO SYSTOLIC/AO DIASTOLIC: 120/62    ANGIOGRAPHIC RESULTS:   1. Left main; normal  2. LAD; normal with midsystolic bridging 3. Left circumflex; dominant with 40% proximal AV groove stenosis. The first and second marginal branches and had stents visualized near the ostium of the vessel but were functionally occluded. The remainder of the dominant circumflex had minor irregularities.[name]  4. Right coronary artery; nondominant and free of significant disease. 5. SVG TO OM1 and OM 2 sequentially was widely patent.    7. Left ventriculography:not performed  IMPRESSION:[name] had complete heart block of unclear etiology and he was symptomatic from this. His anatomy is unchanged from the prior catheteization  in 2011. A. Temperatures his pacemaker was placed via the right femoral vein and was demonstrated to have excellent capture. The arterial sheath was removed and pressure from the right hemostasis. Patient with unstable condition. Will arrange for him to have a permanent transvenous pacemaker placed by Dr. Royann Shivers.  Runell Gess MD, [facility] 02/10/2012 12:54 AM

## 2012-02-10 NOTE — Progress Notes (Signed)
The [facility] [name] is a 66 year old married Caucasian male with history of CAD status post bypass grafting x3 in 2007. His cardiologist is Dr. Hedwig Morton his problems include dyslipidemia, hypertension, chronic left bundle branch block and history of paroxysmal atrial fibrillation in the past. He has chronic obstructive pulmonary disease as well as obstructive sleep apnea noncompliant with CPAP. He is type 2 diabetes. His last cath in 2000 [name] I will revealing a patent sequential graft to OM1 and 2 which appears stented. His LAD was not grafted. He had an occluded graft to a posterolateral branch off the circumflex however the native PLA was patent. He just saw Dr. Cliffton Asters January and had been doing well since that time. Today while driving he had a brief episode of syncope he has had some chest pressure. He noted his heart rate was slow. He came in subcostal ER where he was found to be in complete heart block with ventricular escape rhythm in the 30s. He was treated with atropine and he converted back to sinus rhythm with left bundle. He a recurrent episode of asystole lasting 20 seconds which converted back to complete heart block with ventricular escape and ultimately sinus rhythm. He presents now for cardiac catheterization to rule out ischemic etiology to his complete heart block and placement of a temperature to speak pacemaker in anticipation of a permanent transvenous pacemaker.  Subjective: Some chest tightness earlier, 5/10. Associated SOB.  It eased off on its own after about a minute.  Objective: Vital signs in last 24 hours: Temp:  [97.6 F (36.4 C)-98.1 F (36.7 C)] 98.1 F (36.7 C) (04/17 0400) Pulse Rate:  [33-108] 83  (04/17 0700) Resp:  [12-24] 21  (04/17 0700) BP: (90-128)/(46-78) 126/69 mmHg (04/17 0700) SpO2:  [96 %-99 %] 96 % (04/17 0700) Weight:  [75 kg (165 lb 5.5 oz)] 75 kg (165 lb 5.5 oz) (04/17 0134) Last BM Date: 02/09/12  Intake/Output  from previous day: 04/16 0701 - 04/17 0700 In: 735 [P.O.:350; I.V.:385] Out: 200 [Urine:200] Intake/Output this shift:    Medications Current Facility-Administered Medications  Medication Dose Route Frequency Provider Last Rate Last Dose  . 0.9 %  sodium chloride infusion   Intravenous Continuous Runell Gess, MD 75 mL/hr at 02/10/12 0340 75 mL/hr at 02/10/12 0340  . 0.9 %  sodium chloride infusion   Intravenous Continuous Runell Gess, MD 20 mL/hr at 02/10/12 0503 20 mL/hr at 02/10/12 0503  . acetaminophen (TYLENOL) tablet 650 mg  650 mg Oral Q4H PRN Runell Gess, MD      . antiseptic oral rinse (BIOTENE) solution 15 mL  15 mL Mouth Rinse BID Runell Gess, MD      . aspirin chewable tablet 324 mg  324 mg Oral Once Ethelda Chick, MD   324 mg at 02/09/12 2211  . aspirin EC tablet 325 mg  325 mg Oral Daily Runell Gess, MD      . atorvastatin (LIPITOR) tablet 40 mg  40 mg Oral QHS Runell Gess, MD      . atropine 1 MG/ML injection           . clopidogrel (PLAVIX) tablet 75 mg  75 mg Oral Q breakfast Runell Gess, MD      . heparin 2-0.9 UNIT/ML-% infusion           . insulin aspart (novoLOG) injection 0-15 Units  0-15 Units Subcutaneous TID WC Dwana Melena, PA      .  isosorbide mononitrate (IMDUR) 24 hr tablet 30 mg  30 mg Oral Daily Runell Gess, MD      . lidocaine (XYLOCAINE) 1 % injection           . lisinopril (PRINIVIL,ZESTRIL) tablet 10 mg  10 mg Oral Daily Runell Gess, MD      . metFORMIN (GLUCOPHAGE) tablet 500 mg  500 mg Oral BID WC Runell Gess, MD      . morphine 2 MG/ML injection 1 mg  1 mg Intravenous Q1H PRN Runell Gess, MD      . nitroGLYCERIN (NITROSTAT) SL tablet 0.4 mg  0.4 mg Sublingual Q5 min PRN Runell Gess, MD      . nitroGLYCERIN (NTG ON-CALL) 0.2 mg/mL injection           . ondansetron (ZOFRAN) injection 4 mg  4 mg Intravenous Q6H PRN Runell Gess, MD      . pantoprazole (PROTONIX) EC tablet 40 mg  40 mg  Oral Q1200 Runell Gess, MD      . pneumococcal 23 valent vaccine (PNU-IMMUNE) injection 0.5 mL  0.5 mL Intramuscular Tomorrow-1000 Runell Gess, MD      . sodium chloride 0.9 % bolus 500 mL  500 mL Intravenous Once Consuello Masse, MD   500 mL at 02/09/12 2211  . DISCONTD: aspirin tablet 243 mg  243 mg Oral Once Consuello Masse, MD      . DISCONTD: aspirin tablet 325 mg  325 mg Oral Once Consuello Masse, MD      . DISCONTD: aspirin tablet 81 mg  81 mg Oral Daily Runell Gess, MD      . DISCONTD: atropine 0.1 MG/ML injection           . DISCONTD: clopidogrel (PLAVIX) tablet 75 mg  75 mg Oral BH-q7a Runell Gess, MD      . DISCONTD: metFORMIN (GLUCOPHAGE) tablet 500 mg  500 mg Oral BID WC Runell Gess, MD      . DISCONTD: nitroGLYCERIN (NITROSTAT) SL tablet 0.4 mg  0.4 mg Sublingual Once Consuello Masse, MD        PE: General appearance: alert, cooperative and no distress HEENT: PERRLA, EOMI Lungs: clear to auscultation bilaterally, No wheeze Heart: regular rate and rhythm, S1, S2 normal, no murmur, click, rub or gallop Abdomen: +BS, Mildly tender difusely. Extremities: No LEE Pulses: 2+ and symmetric Neuro: strength 5/5 equal in upper extremities Skin: dry/warm  Lab Results:   Basename 02/09/12 2229 02/09/12 2113  WBC -- 12.4*  HGB 16.3 16.5  HCT 48.0 45.6  PLT -- 311   BMET  Basename 02/09/12 2229 02/09/12 2113  NA 136 133*  K 3.9 4.2  CL 103 98  CO2 -- 20  GLUCOSE 510* 471*  BUN 31* 28*  CREATININE 1.20 1.22  CALCIUM -- 9.9   PT/INR  Basename 02/09/12 2113  LABPROT 13.0  INR 0.96   Cholesterol No results found for this basename: CHOL in the last 72 hours Cardiac Enzymes No components found with this basename: TROPONIN:3, CKMB:3  Studies/Results: HEMODYNAMICS:  AO SYSTOLIC/AO DIASTOLIC: 120/62  ANGIOGRAPHIC RESULTS:  1. Left main; normal  2. LAD; normal with midsystolic bridging  3. Left circumflex; dominant with 40% proximal AV groove  stenosis. The first and second marginal branches and had stents visualized near the ostium of the vessel but were functionally occluded. The remainder of the dominant circumflex had minor irregularities.[name]  4. Right  coronary artery; nondominant and free of significant disease.  5. SVG TO OM1 and OM 2 sequentially was widely patent.  7. Left ventriculography:not performed  IMPRESSION:[name] had complete heart block of unclear etiology and he was symptomatic from this. His anatomy is unchanged from the prior catheteization in 2011. A. Temperatures his pacemaker was placed via the right femoral vein and was demonstrated to have excellent capture. The arterial sheath was removed and pressure from the right hemostasis. Patient with unstable condition. Will arrange for him to have a permanent transvenous pacemaker placed by Dr. Royann Shivers.  PORTABLE CHEST - 1 VIEW  Comparison: Chest radiograph performed 10/15/2011  Findings: The lungs are well-aerated and clear. There is no  evidence of focal opacification, pleural effusion or pneumothorax.  The cardiomediastinal silhouette is normal in size; the patient is  status post median sternotomy, with evidence of prior CABG.  Calcification is noted within the aortic arch. No acute osseous  abnormalities are seen.  IMPRESSION:  No acute cardiopulmonary process seen.   Assessment/Plan  Principal Problem:  *Complete heart block Active Problems:  DM, Type 2 NIDDM  OBSTRUCTIVE SLEEP APNEA, non compliant with C-Pap  CHRONIC OBSTRUCTIVE PULMONARY DISEASE  CAD (coronary artery disease) of bypass graft, CABG 2007, cath Feb 2011  LBBB (left bundle branch block)  HTN (hypertension)  Dyslipidemia Hyperglycemia  Plan:  S/P left heart cath and temp pacer for complete heart block last night. Unchanged coronaries from previous cath.  Left circumflex; dominant with 40% proximal AV groove stenosis. The first and second marginal branches and had stents visualized near  the ostium of the vessel but were functionally occluded. The remainder of the dominant circumflex had minor irregularities.  Will need PPM.  Schedule for 02/11/12.  Glycemic order set ordered this AM.  BP stable.   LOS: 1 day    [name] 02/10/2012 7:48 AM

## 2012-02-10 NOTE — Progress Notes (Signed)
Pt on monitor HR 85, pacer spikes noted (temp pacer Ventricular rate set at 70). Pacer spikes were during QRS and after QRS. PA paged and notified. Orders received to decrease Ventricular rate to 50. Pt HR stable in 80s, all other VSS. Will continue to monitor closely.

## 2012-02-10 NOTE — Progress Notes (Signed)
UR Completed. Simmons, Anberlyn Feimster F [phone]  

## 2012-02-10 NOTE — ED Notes (Signed)
Dr. Allyson Sabal (card MD) present, consent being signed.

## 2012-02-11 ENCOUNTER — Encounter (HOSPITAL_COMMUNITY)
Admission: EM | Disposition: A | Discharge status: Home / Self Care | Payer: Self-pay | Source: Home / Self Care | Attending: Cardiovascular Disease

## 2012-02-11 DIAGNOSIS — I442 Atrioventricular block, complete: Secondary | ICD-10-CM

## 2012-02-11 HISTORY — DX: Atrioventricular block, complete: I44.2

## 2012-02-11 HISTORY — PX: PERMANENT PACEMAKER INSERTION: SHX5480

## 2012-02-11 LAB — BASIC METABOLIC PANEL
CO2: 25 mEq/L (ref 19–32)
Calcium: 9 mg/dL (ref 8.4–10.5)
Chloride: 101 mEq/L (ref 96–112)
Creatinine, Ser: 0.66 mg/dL (ref 0.50–1.35)
Glucose, Bld: 275 mg/dL — ABNORMAL HIGH (ref 70–99)

## 2012-02-11 LAB — GLUCOSE, CAPILLARY
Glucose-Capillary: 265 mg/dL — ABNORMAL HIGH (ref 70–99)
Glucose-Capillary: 191 mg/dL — ABNORMAL HIGH (ref 70–99)

## 2012-02-11 LAB — CBC
HCT: 42.5 % (ref 39.0–52.0)
MCH: 31.9 pg (ref 26.0–34.0)
MCV: 89.7 fL (ref 78.0–100.0)
Platelets: 239 10*3/uL (ref 150–400)
RDW: 12.8 % (ref 11.5–15.5)
WBC: 8.3 10*3/uL (ref 4.0–10.5)

## 2012-02-11 SURGERY — PERMANENT PACEMAKER INSERTION
Anesthesia: LOCAL

## 2012-02-11 MED ORDER — HEPARIN (PORCINE) IN NACL 2-0.9 UNIT/ML-% IJ SOLN
INTRAMUSCULAR | Status: AC
  Filled 2012-02-11: qty 1000

## 2012-02-11 MED ORDER — INSULIN ASPART 100 UNIT/ML ~~LOC~~ SOLN
0.0000 [IU] | Freq: Three times a day (TID) | SUBCUTANEOUS | Status: DC
  Administered 2012-02-11: 3 [IU] via SUBCUTANEOUS
  Administered 2012-02-11: 7 [IU] via SUBCUTANEOUS

## 2012-02-11 MED ORDER — HYDROCODONE-ACETAMINOPHEN 5-325 MG PO TABS
1.0000 | ORAL_TABLET | ORAL | Status: DC | PRN
  Administered 2012-02-11 – 2012-02-12 (×2): 1 via ORAL
  Filled 2012-02-11 (×2): qty 1

## 2012-02-11 MED ORDER — FENTANYL CITRATE 0.05 MG/ML IJ SOLN
INTRAMUSCULAR | Status: AC
  Filled 2012-02-11: qty 2

## 2012-02-11 MED ORDER — CEFAZOLIN SODIUM 1-5 GM-% IV SOLN
1.0000 g | Freq: Four times a day (QID) | INTRAVENOUS | Status: AC
  Administered 2012-02-11 – 2012-02-12 (×3): 1 g via INTRAVENOUS
  Filled 2012-02-11 (×3): qty 50

## 2012-02-11 MED ORDER — ONDANSETRON HCL 4 MG/2ML IJ SOLN: 4.0000 mg | Freq: Four times a day (QID) | INTRAMUSCULAR | Status: DC | PRN

## 2012-02-11 MED ORDER — LIDOCAINE HCL (PF) 1 % IJ SOLN
INTRAMUSCULAR | Status: AC
  Filled 2012-02-11: qty 60

## 2012-02-11 MED ORDER — SODIUM CHLORIDE 0.9 % IV SOLN
INTRAVENOUS | Status: AC
  Administered 2012-02-11 – 2012-02-12 (×2): via INTRAVENOUS

## 2012-02-11 MED ORDER — MIDAZOLAM HCL 2 MG/2ML IJ SOLN
INTRAMUSCULAR | Status: AC
  Filled 2012-02-11: qty 2

## 2012-02-11 MED ORDER — ALPRAZOLAM 0.25 MG PO TABS: 0.2500 mg | ORAL_TABLET | Freq: Three times a day (TID) | ORAL | Status: DC | PRN

## 2012-02-11 MED ORDER — ACETAMINOPHEN 325 MG PO TABS: 325.0000 mg | ORAL_TABLET | ORAL | Status: DC | PRN

## 2012-02-11 MED ORDER — SODIUM CHLORIDE 0.9 % IV SOLN
Freq: Once | INTRAVENOUS | Status: AC
  Administered 2012-02-11: 200 mL via INTRAVENOUS

## 2012-02-11 MED ORDER — ZOLPIDEM TARTRATE 5 MG PO TABS: 5.0000 mg | ORAL_TABLET | Freq: Every evening | ORAL | Status: DC | PRN

## 2012-02-11 NOTE — CV Procedure (Signed)
Ray,Wesley L Male, 66 y.o., 1945/11/06  MRN: [id number]   Procedure report  Procedure performed:  1. Implantation of new dual chamber permanent pacemaker 2. Fluoroscopy 3.  Light sedation 4. Left upper extremity venogram   Reason for procedure: Syncope and symptomatic bradycardia due to: Paroxysmal complete heart block  Procedure performed by: Thurmon Fair, MD  Complications: None  Estimated blood loss: <10 mL  Medications administered during procedure: Ancef 1 g intravenously Lidocaine 1% 30 mL locally,  Fentanyl 3 mcg intravenously Versed 75 mg intravenously Omnipaque 10 mL  Device details: Generator Medtronic REVO model RV DRO1 serial number [id number] H Right atrial lead Medtronic 5086 MRI-52 cm serial number [id number] Right ventricular lead Medtronic 56 MRI-58 cm serial number [id number]  Procedure details:  After the risks and benefits of the procedure were discussed the patient provided informed consent and was brought to the cardiac cath lab in the fasting state. The patient was prepped and draped in usual sterile fashion. Local anesthesia with 1% lidocaine was administered to to the left infraclavicular area. A 5-6 cm horizontal incision was made parallel with and 2-3 cm caudal to the left clavicle. Using electrocautery and blunt dissection a prepectoral pocket was created down to the level of the pectoralis major muscle fascia. The pocket was carefully inspected for hemostasis. An antibiotic-soaked sponge was placed in the pocket.  Under fluoroscopic guidance and using the modified Seldinger technique 2 separate venipunctures were performed to access the left subclavian vein. considerable difficulty was encountered accessing the vein.  A. Left upper venogram was performed after which the vein was easily accessed. Two J-tip guidewires were subsequently exchanged for two 8 French safe sheaths.  Under fluoroscopic guidance the ventricular lead was advanced to  level of the mid to apical right ventricular septum and thet active-fixation helix was deployed. Prominent current of injury was seen. Satisfactory pacing and sensing parameters were recorded. There was no evidence of diaphragmatic stimulation at maximum device output. The safe sheath was peeled away and the lead was secured in place with 2-0 silk.  In similar fashion the right atrial lead was advanced to the level of the atrial appendage. The active-fixation helix was deployed. There was prominent current of injury. Satisfactory  pacing and sensing parameters were recorded. There was no evidence of diaphragmatic stimulation with pacing at maximum device output. The safe sheath was peeled away and the lead was secured in place with 2-0 silk.  The antibiotic-soaked sponge was removed from the pocket. The pocket was flushed with copious amounts of antibiotic solution. Reinspection showed excellent hemostasis..  The ventricular lead was connected to the generator and appropriate ventricular pacing was seen. Subsequently the atrial lead was also connected. Repeat testing of the lead parameters later showed excellent values.  The entire system was then carefully inserted in the pocket with care been taking that the leads and device assumed a comfortable position without pressure on the incision. Great care was taken that the leads be located deep to the generator. The pocket was then closed in layers using 2 layers of 2-0 Vicryl and cutaneous staples, after which a sterile dressing was applied.  At the end of the procedure the following lead parameters were encountered:  Right atrial lead  sensed P waves 5.1 mV, impedance 1029ohms, threshold 1.5 V at 0.5 ms pulse width.  Right ventricular lead sensed R waves 16.2 mV, impedance 1354ohms, threshold 0.9 V at 0.5 ms pulse width.  Thurmon Fair, MD, [facility] (  [phone] office [phone] pager 02/11/2012 3:31  PM

## 2012-02-11 NOTE — Progress Notes (Signed)
Inpatient Diabetes Program Recommendations  AACE/ADA: New Consensus Statement on Inpatient Glycemic Control (2009)  Target Ranges:  Prepandial:   less than 140 mg/dL      Peak postprandial:   less than 180 mg/dL (1-2 hours)      Critically ill patients:  140 - 180 mg/dL  Results for [name], [name] (MRN [id number]) as of 02/11/2012 12:36  Ref. Range 02/10/2012 07:28 02/10/2012 11:43 02/10/2012 17:38 02/10/2012 21:41 02/11/2012 07:30  Glucose-Capillary Latest Range: 70-99 mg/dL 782 (H) 956 (H) 213 (H) 236 (H) 265 (H)    Inpatient Diabetes Program Recommendations Insulin - Basal: start Lantus 15 units HgbA1C: pending new results  Thank you  Piedad Climes [facility] Inpatient Diabetes Coordinator [phone]

## 2012-02-11 NOTE — Progress Notes (Signed)
The [facility]  Subjective: No Complaints.  Objective: Vital signs in last 24 hours: Temp:  [97.4 F (36.3 C)-98.4 F (36.9 C)] 98.1 F (36.7 C) (04/18 0400) Pulse Rate:  [77-95] 95  (04/17 1700) Resp:  [14-22] 18  (04/18 0700) BP: (82-135)/(43-79) 115/63 mmHg (04/18 0700) SpO2:  [92 %-96 %] 94 % (04/18 0400) Last BM Date: 02/09/12  Intake/Output from previous day: 04/17 0701 - 04/18 0700 In: 2053.3 [P.O.:1540; I.V.:513.3] Out: 2000 [Urine:2000] Intake/Output this shift:    Medications Current Facility-Administered Medications  Medication Dose Route Frequency Provider Last Rate Last Dose  . 0.45 % sodium chloride infusion   Intravenous Continuous Dwana Melena, PA 50 mL/hr at 02/11/12 0616 50 mL/hr at 02/11/12 0616  . 0.9 %  sodium chloride infusion   Intravenous Continuous Runell Gess, MD 20 mL/hr at 02/10/12 0503 20 mL/hr at 02/10/12 0503  . 0.9 %  sodium chloride infusion   Intravenous Continuous Dwana Melena, PA 50 mL/hr at 02/11/12 0616 50 mL/hr at 02/11/12 0616  . acetaminophen (TYLENOL) tablet 650 mg  650 mg Oral Q4H PRN Runell Gess, MD      . antiseptic oral rinse (BIOTENE) solution 15 mL  15 mL Mouth Rinse BID Runell Gess, MD   15 mL at 02/10/12 2030  . aspirin EC tablet 325 mg  325 mg Oral Daily Runell Gess, MD   325 mg at 02/10/12 1144  . atorvastatin (LIPITOR) tablet 40 mg  40 mg Oral QHS Runell Gess, MD   40 mg at 02/10/12 2136  . ceFAZolin (ANCEF) IVPB 1 g/50 mL premix  1 g Intravenous On Call Dwana Melena, PA      . chlorhexidine (HIBICLENS) 4 % liquid 4 application  60 mL Topical Once Dwana Melena, [place]   4 application at 02/10/12 2138  . chlorhexidine (HIBICLENS) 4 % liquid 4 application  60 mL Topical Once Dwana Melena, [place]   4 application at 02/11/12 0615  . clopidogrel (PLAVIX) tablet 75 mg  75 mg Oral Q breakfast Runell Gess, MD   75 mg at 02/10/12 0802  . gentamicin (GARAMYCIN) 80 mg in sodium  chloride irrigation 0.9 % 500 mL irrigation  80 mg Irrigation On Call Dwana Melena, PA      . insulin aspart (novoLOG) injection 0-15 Units  0-15 Units Subcutaneous TID WC Dwana Melena, PA   8 Units at 02/10/12 1741  . isosorbide mononitrate (IMDUR) 24 hr tablet 30 mg  30 mg Oral Daily Runell Gess, MD   30 mg at 02/10/12 1144  . lisinopril (PRINIVIL,ZESTRIL) tablet 10 mg  10 mg Oral Daily Runell Gess, MD   10 mg at 02/10/12 1145  . living well with diabetes book MISC   Does not apply Once Runell Gess, MD      . metFORMIN (GLUCOPHAGE) tablet 500 mg  500 mg Oral BID WC Runell Gess, MD      . morphine 2 MG/ML injection 1 mg  1 mg Intravenous Q1H PRN Runell Gess, MD      . nitroGLYCERIN (NITROSTAT) SL tablet 0.4 mg  0.4 mg Sublingual Q5 min PRN Runell Gess, MD      . ondansetron [facility]) injection 4 mg  4 mg Intravenous Q6H PRN Runell Gess, MD      . pantoprazole (PROTONIX) EC tablet 40 mg  40 mg Oral Q1200 Runell Gess,  MD   40 mg at 02/10/12 1144  . pneumococcal 23 valent vaccine (PNU-IMMUNE) injection 0.5 mL  0.5 mL Intramuscular Tomorrow-1000 Runell Gess, MD        PE: General appearance: alert, cooperative and no distress Lungs: clear to auscultation bilaterally Heart: regular rate and rhythm, S1, S2 normal, no murmur, click, rub or gallop Extremities: No LEE Pulses: 2+ and symmetric  Lab Results:   Basename 02/11/12 0500 02/09/12 2229 02/09/12 2113  WBC 8.3 -- 12.4*  HGB 15.1 16.3 16.5  HCT 42.5 48.0 45.6  PLT 239 -- 311   BMET  Basename 02/11/12 0500 02/09/12 2229 02/09/12 2113  NA 136 136 133*  K 3.7 3.9 4.2  CL 101 103 98  CO2 25 -- 20  GLUCOSE 275* 510* 471*  BUN 16 31* 28*  CREATININE 0.66 1.20 1.22  CALCIUM 9.0 -- 9.9   PT/INR  Basename 02/09/12 2113  LABPROT 13.0  INR 0.96    Assessment/Plan  Principal Problem:  *Complete heart block Active Problems:  DM, Type 2 NIDDM  OBSTRUCTIVE SLEEP APNEA, non compliant  with C-Pap  CHRONIC OBSTRUCTIVE PULMONARY DISEASE  CAD (coronary artery disease) of bypass graft, CABG 2007, cath Feb 2011  LBBB (left bundle branch block)  HTN (hypertension)  Dyslipidemia Hyperglycemia.  Plan: Not currently pacing and did not see any pacing in the last 24hrs on telemetry.  Sinus rhythm currently.  Scheduled for PPM today.   Checking HgbA1C.  Glucose poorly controlled.  Takes metformin 500mg  BID at home.  Might need a long acting insulin.  Will request diabetes coordinator consult.  Switching to resistant scale.   LOS: 2 days    HAGER,BRYAN W 02/11/2012 7:44 AM  I have seen and examined the patient along with HAGER,BRYAN W, PA.  I have reviewed the chart, notes and new data.  I agree with PA's note.  Key new complaints: none Key examination changes: TPW site healthy; NSR w 1st degree AVB and LBBB Key new findings / data: No new episodes of asystole  PLAN: Needs dual chamber PPM for paroxysmal third degree AV block with syncope. Risks and benefits discussed in detail. He agrees to proceed. EF is mildly depressed and has no CHF. Upgrade to BiV device is an option in the future if LV function deteriorates.  Thurmon Fair, MD, [facility] [phone] 02/11/2012, 8:31 AM

## 2012-02-12 ENCOUNTER — Inpatient Hospital Stay (HOSPITAL_COMMUNITY): Payer: Medicare Other

## 2012-02-12 LAB — GLUCOSE, CAPILLARY: Glucose-Capillary: 196 mg/dL — ABNORMAL HIGH (ref 70–99)

## 2012-02-12 MED ORDER — METFORMIN HCL 500 MG PO TABS: 1000.0000 mg | ORAL_TABLET | Freq: Two times a day (BID) | ORAL | Status: DC

## 2012-02-12 MED ORDER — HYDROCODONE-ACETAMINOPHEN 5-325 MG PO TABS: 1.0000 | ORAL_TABLET | ORAL | Status: AC | PRN

## 2012-02-12 NOTE — ED Provider Notes (Signed)
I saw and evaluated the patient, reviewed the resident's note and I agree with the findings and plan.  I have seen and reviewed the EKG and agree with the findings as documented by the resident  Pt was seen urgently upon arrival to the ED.  Initial HR in 30s, pt placed on monitor, IV access obtained.  HR spontaneously converted to sinus tachycardia at 109.  Workup proceeded and cardiology, Dr. Allyson Sabal consulted, initially pt to be admitted to hospitaslist service- however, had another episode of bradycardia and complete heart block associated with transient loss of consciousness.  Dr. Allyson Sabal called again and came to ED for consult.  Atropine administered, pt taken to cath lab for pacemaker placement  CRITICAL CARE Performed by: Ethelda Chick  ?  Total critical care time: 40  Critical care time was exclusive of separately billable procedures and treating other patients.  Critical care was necessary to treat or prevent imminent or life-threatening deterioration.  Critical care was time spent personally by me on the following activities: development of treatment plan with patient and/or surrogate as well as nursing, discussions with consultants, evaluation of patient's response to treatment, examination of patient, obtaining history from patient or surrogate, ordering and performing treatments and interventions, ordering and review of laboratory studies, ordering and review of radiographic studies, pulse oximetry and re-evaluation of patient's condition.  Ethelda Chick, MD 02/12/12 [phone]
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
## 2012-02-12 NOTE — Discharge Instructions (Signed)

## 2013-08-04 ENCOUNTER — Encounter: Payer: Self-pay | Admitting: *Deleted

## 2013-08-21 ENCOUNTER — Ambulatory Visit (INDEPENDENT_AMBULATORY_CARE_PROVIDER_SITE_OTHER): Payer: Medicare Other

## 2013-08-21 DIAGNOSIS — I442 Atrioventricular block, complete: Secondary | ICD-10-CM

## 2013-08-21 DIAGNOSIS — I447 Left bundle-branch block, unspecified: Secondary | ICD-10-CM

## 2013-08-21 DIAGNOSIS — I480 Paroxysmal atrial fibrillation: Secondary | ICD-10-CM

## 2013-08-21 DIAGNOSIS — I4891 Unspecified atrial fibrillation: Secondary | ICD-10-CM

## 2013-08-21 LAB — PACEMAKER DEVICE OBSERVATION

## 2013-08-22 ENCOUNTER — Encounter: Payer: Self-pay | Admitting: *Deleted

## 2013-08-22 LAB — REMOTE PACEMAKER DEVICE
AL AMPLITUDE: 1.9128 mv
AL IMPEDENCE PM: 432 Ohm
ATRIAL PACING PM: 12.59
BAMS-0001: 150 {beats}/min
BATTERY VOLTAGE: 3 v
BRDY-0002RA: 60 {beats}/min
BRDY-0003RA: 130 {beats}/min
BRDY-0004RA: 130 {beats}/min
RV LEAD IMPEDENCE PM: 488 Ohm
VENTRICULAR PACING PM: 82.77

## 2013-11-20 ENCOUNTER — Encounter: Payer: Self-pay | Admitting: *Deleted

## 2013-11-24 ENCOUNTER — Ambulatory Visit (INDEPENDENT_AMBULATORY_CARE_PROVIDER_SITE_OTHER): Payer: Medicare Other | Admitting: Cardiovascular Disease

## 2013-11-24 ENCOUNTER — Encounter: Payer: Self-pay | Admitting: Cardiovascular Disease

## 2013-11-24 ENCOUNTER — Ambulatory Visit: Payer: Medicare Other | Admitting: Cardiovascular Disease

## 2013-11-24 VITALS — BP 122/80 | HR 86 | Resp 16 | Ht 70.0 in | Wt 165.2 lb

## 2013-11-24 DIAGNOSIS — I5042 Chronic combined systolic (congestive) and diastolic (congestive) heart failure: Secondary | ICD-10-CM

## 2013-11-24 DIAGNOSIS — I442 Atrioventricular block, complete: Secondary | ICD-10-CM

## 2013-11-24 DIAGNOSIS — I4891 Unspecified atrial fibrillation: Secondary | ICD-10-CM

## 2013-11-24 DIAGNOSIS — I509 Heart failure, unspecified: Secondary | ICD-10-CM

## 2013-11-24 DIAGNOSIS — I447 Left bundle-branch block, unspecified: Secondary | ICD-10-CM

## 2013-11-24 DIAGNOSIS — R0602 Shortness of breath: Secondary | ICD-10-CM

## 2013-11-24 DIAGNOSIS — I25810 Atherosclerosis of coronary artery bypass graft(s) without angina pectoris: Secondary | ICD-10-CM

## 2013-11-24 DIAGNOSIS — I480 Paroxysmal atrial fibrillation: Secondary | ICD-10-CM

## 2013-11-24 LAB — MDC_IDC_ENUM_SESS_TYPE_INCLINIC
Brady Statistic AP VP Percent: 12.46 %
Brady Statistic AP VS Percent: 0.87 %
Brady Statistic AS VS Percent: 16.48 %
Brady Statistic RV Percent Paced: 82.65 %
Date Time Interrogation Session: 20150130173043
Lead Channel Impedance Value: 496 Ohm
Lead Channel Pacing Threshold Amplitude: 1 V
Lead Channel Pacing Threshold Amplitude: 1 V
Lead Channel Pacing Threshold Pulse Width: 0.4 ms
Lead Channel Pacing Threshold Pulse Width: 0.4 ms
Lead Channel Setting Pacing Amplitude: 2.5 V
Lead Channel Setting Pacing Pulse Width: 0.4 ms
Lead Channel Setting Sensing Sensitivity: 0.9 mV
MDC IDC MSMT BATTERY VOLTAGE: 3 V
MDC IDC MSMT LEADCHNL RA IMPEDANCE VALUE: 448 Ohm
MDC IDC MSMT LEADCHNL RA SENSING INTR AMPL: 0.6956
MDC IDC SET LEADCHNL RA PACING AMPLITUDE: 2 V
MDC IDC SET ZONE DETECTION INTERVAL: 400 ms
MDC IDC STAT BRADY AS VP PERCENT: 70.19 %
MDC IDC STAT BRADY RA PERCENT PACED: 13.34 %
Zone Setting Detection Interval: 400 ms

## 2013-11-24 LAB — PACEMAKER DEVICE OBSERVATION

## 2013-11-24 MED ORDER — BENAZEPRIL HCL 5 MG PO TABS: 2.5000 mg | ORAL_TABLET | Freq: Every day | ORAL | Status: DC

## 2013-11-24 NOTE — Assessment & Plan Note (Signed)
Currently no symptoms of coronary insufficiency. The SVG to OM1 is patent. The other SVG to posterolateral artery was occluded at the time of his catheterization in 2011. There was however good flow through the native right coronary artery.

## 2013-11-24 NOTE — Patient Instructions (Signed)
Your physician has requested that you have an echocardiogram. Echocardiography is a painless test that uses sound waves to create images of your heart. It provides your doctor with information about the size and shape of your heart and how well your heart's chambers and valves are working. This procedure takes approximately one hour. There are no restrictions for this procedure.  Your physician has recommended you make the following change in your medication: START Benazepril 2.5 mg daily  You have been referred to Dr.Gregg Ladona Ridgel to discuss a BiV device  Your physician recommends that you schedule a follow-up appointment in: 3 months with Dr.Croitoru

## 2013-11-24 NOTE — Assessment & Plan Note (Signed)
His dyspnea could be related to both pulmonary or cardiac abnormalities but I suspect it is primarily secondary to congestive heart failure. He requires a relatively large dose of beta blockers to control his symptomatic atrial tachycardia. We'll try to restart the ACE inhibitor and a very low dose in titrated up very slowly. Reevaluate his EF by echocardiography. I think he would benefit from upgrade  to CRT. He is virtually 100% paced now and even before pacemaker implantation had a very broad left bundle branch block. We'll refer him to Dr. [name]. Meanwhile we'll repeat an echocardiogram to evaluate his left ventricular ejection fraction, since systolic function was very close to the threshold for primary prevention defibrillator implantation. His LVEF would have an impact on decision regarding CRT-P versus CRT-D

## 2013-11-24 NOTE — Progress Notes (Signed)
Patient ID: [name], male   DOB: 1946-01-20, 68 y.o.   MRN: [id number]      Reason for office visit CAD, complete heart block/pacemaker followup, CHF  Wesley AshingJim is now 68 years old and returns in followup. He presented with complete heart block in April of 2013 and required a temporary transvenous pacemaker, undergoing implantation of a dual-chamber permanent pacemaker the next day. for years before that he had had a permanent left bundle branch block. He has a long-standing history of coronary disease and underwent bypass surgery in 2007. Just before getting his pacemaker he underwent cardiac catheterization which showed a patent sequential saphenous vein graft bypass to the first and second oblique marginal arteries (both of these vessels had previously been stented and had severe in-stent restenosis).   Left ventricular ejection fraction had been 45-50% prior to that hospitalization. Following implantation of his pacemaker his echocardiogram showed moderately depressed left ventricular systolic function with an ejection fraction of 35-40%. He continues to have symptoms of shortness of breath on exertion (NYHA functional class III) but he has been working until today (he just retired). He briefly had return of A-V conduction for about a month, but now has nearly 100% ventricular pacing. He has no underlying escape rhythm today and is 100% paced. He requires relatively high doses of beta blockers for control of recurrent paroxysmal atrial tachycardia which was quite symptomatic. The current dose of beta blocker it doesn't bother him at all. Attempts at treatment with ACE inhibitor in the past led to hypotension.  No Known Allergies  Current Outpatient Prescriptions  Medication Sig Dispense Refill  . albuterol-ipratropium (COMBIVENT) 18-103 MCG/ACT inhaler Inhale 2 puffs into the lungs every 4 (four) hours as needed for wheezing or shortness of breath.      [name]. aspirin 81 MG tablet Take 81 mg by mouth  every morning.       [name]. atorvastatin (LIPITOR) 40 MG tablet Take 40 mg by mouth at bedtime.       . clopidogrel (PLAVIX) 75 MG tablet Take 75 mg by mouth every morning.       [name]. glipiZIDE (GLUCOTROL XL) 5 MG 24 hr tablet Take 5 mg by mouth daily.      [name]. HYDROcodone-acetaminophen (NORCO/VICODIN) 5-325 MG per tablet Take 1 tablet by mouth daily as needed.      . isosorbide mononitrate (IMDUR) 30 MG 24 hr tablet Take 30 mg by mouth daily.        . metFORMIN (GLUCOPHAGE) 1000 MG tablet Take 1,000 mg by mouth 2 (two) times daily with a meal.      . metoprolol (LOPRESSOR) 100 MG tablet Take 100 mg by mouth 2 (two) times daily.      . pantoprazole (PROTONIX) 40 MG tablet Take 40 mg by mouth every morning.      . benazepril (LOTENSIN) 5 MG tablet Take 0.5 tablets (2.5 mg total) by mouth daily.  15 tablet  3  . nitroGLYCERIN (NITROSTAT) 0.4 MG SL tablet Place 0.4 mg under the tongue every 5 (five) minutes as needed. For chest pain       No current facility-administered medications for this visit.    Past Medical History  Diagnosis Date  . Coronary artery disease   . Myocardial infarction   . Psoriasis   . Hypertension   . Diabetes mellitus   . COPD (chronic obstructive pulmonary disease)   . Sleep apnea     c pap at home, does not use  .  CHB (complete heart block) 02/11/12    Medtronic permanent pacemaker  . DOE (dyspnea on exertion)     NYHA class 2-3  . Atrial tachycardia   . Cardiomyopathy     mixed ischemic and nonischemic  . Dyslipidemia   . LBBB (left bundle branch block)     Past Surgical History  Procedure Laterality Date  . Coronary artery bypass graft  2007    x 3  . Cardiac catheterization  02/10/12    left CX: dominant w/40% prox. AV groove stenosis.The first & second marginal branches had stents visualized near the ostium of the vessel bu were functionally occluded. The remainder of the dominant CX had minor irregularities.  RCA: nondominant free of sign. disease. SVG to OM1  & OM2 sequentially was widely patent.  . US echocardiography  01/10/10    LV systolic fx mod to severely reduced, EF 35-40%, AOV mildly sclerotic  . Nm myocar perf wall motion  10/02/2009    no significant ischemia    Family History  Problem Relation Age of Onset  . Cancer Father     History   Social History  . Marital Status: Married    Spouse Name: N/A    Number of Children: N/A  . Years of Education: N/A   Occupational History  . Not on file.   Social History Main Topics  . Smoking status: Former Smoker -- 36 years    Types: Cigars    Quit date: 05/25/2005  . Smokeless tobacco: Never Used  . Alcohol Use: No  . Drug Use: No  . Sexual Activity:    Other Topics Concern  . Not on file   Social History Narrative  . No narrative on file    Review of systems: He describes shortness of breath with relatively light activity (NYHA class III). He denies wheezing or coughing but he does use an inhaler regularly. He denies angina pectoris he did rest with exertion has not experienced palpitations recently. He has never had syncope. He denies lower showed edema, intermittent claudication, focal neurological deficits.  No cough, hemoptysis or wheezing.  The patient also denies abdominal pain, nausea, vomiting, dysphagia, diarrhea, constipation, polyuria, polydipsia, dysuria, hematuria, frequency, urgency, abnormal bleeding or bruising, fever, chills, unexpected weight changes, mood swings, change in skin or hair texture, change in voice quality, auditory or visual problems, allergic reactions or rashes, new musculoskeletal complaints other than usual "aches and pains".   PHYSICAL EXAM BP 122/80  Pulse 86  Resp 16  Ht 5\' 10"  (1.778 m)  Wt 74.934 kg (165 lb 3.2 oz)  BMI 23.70 kg/m2  General: Alert, oriented x3, no distress Head: no evidence of trauma, PERRL, EOMI, no exophtalmos or lid lag, no myxedema, no xanthelasma; normal ears, nose and oropharynx Neck: normal jugular  venous pulsations and no hepatojugular reflux; brisk carotid pulses without delay and no carotid bruits Chest: clear to auscultation, no signs of consolidation by percussion or palpation, normal fremitus, symmetrical and full respiratory excursions, healthy appearing left subclavian pacemaker site Cardiovascular: normal position and quality of the apical impulse, regular rhythm, normal first and paradoxically split second heart sounds, no murmurs, rubs or gallops Abdomen: no tenderness or distention, no masses by palpation, no abnormal pulsatility or arterial bruits, normal bowel sounds, no hepatosplenomegaly Extremities: no clubbing, cyanosis or edema; 2+ radial, ulnar and brachial pulses bilaterally; 2+ right femoral, posterior tibial and dorsalis pedis pulses; 2+ left femoral, posterior tibial and dorsalis pedis pulses; no subclavian or femoral bruits  Neurological: grossly nonfocal Skin exam shows a few psoriatic plaques on his elbows knees and around the hairline   EKG: Atrial sensed ventricular paced, probable left atrial abnormality. Older EKGs shows left bundle branch block with a QRS duration of about 160 ms  Device interrogation shows occasional episodes of paroxysmal atrial tachycardia with rates of around 100 4250 beats per minute in the atrium (ventricular pacing (these have generally been brief lasting between 30 seconds and 6 minutes.  Lipid Panel     Component Value Date/Time   CHOL 135 10/16/2011 0400   TRIG 187* 10/16/2011 0400   HDL 34* 10/16/2011 0400   CHOLHDL 4.0 10/16/2011 0400   VLDL 37 10/16/2011 0400   LDLCALC 64 10/16/2011 0400    BMET    Component Value Date/Time   NA 136 02/11/2012 0500   K 3.7 02/11/2012 0500   CL 101 02/11/2012 0500   CO2 25 02/11/2012 0500   GLUCOSE 275* 02/11/2012 0500   BUN 16 02/11/2012 0500   CREATININE 0.66 02/11/2012 0500   CALCIUM 9.0 02/11/2012 0500   GFRNONAA >90 02/11/2012 0500   GFRAA >90 02/11/2012 0500     ASSESSMENT AND  PLAN Chronic combined systolic and diastolic CHF, NYHA class 3 His dyspnea could be related to both pulmonary or cardiac abnormalities but I suspect it is primarily secondary to congestive heart failure. He requires a relatively large dose of beta blockers to control his symptomatic atrial tachycardia. We'll try to restart the ACE inhibitor and a very low dose in titrated up very slowly. Reevaluate his EF by echocardiography. I think he would benefit from upgrade  to CRT. He is virtually 100% paced now and even before pacemaker implantation had a very broad left bundle branch block. We'll refer him to Dr. [name]. Meanwhile we'll repeat an echocardiogram to evaluate his left ventricular ejection fraction, since systolic function was very close to the threshold for primary prevention defibrillator implantation. His LVEF would have an impact on decision regarding CRT-P versus CRT-D  CAD (coronary artery disease) of bypass graft, CABG 2007, cath Feb 2011 Currently no symptoms of coronary insufficiency. The SVG to OM1 is patent. The other SVG to posterolateral artery was occluded at the time of his catheterization in 2011. There was however good flow through the native right coronary artery.   Orders Placed This Encounter  Procedures  . Ambulatory referral to Cardiac Electrophysiology  . EKG 12-Lead  . 2D Echocardiogram without contrast   Meds ordered this encounter  Medications  . glipiZIDE (GLUCOTROL XL) 5 MG 24 hr tablet    Sig: Take 5 mg by mouth daily.  [name] HYDROcodone-acetaminophen (NORCO/VICODIN) 5-325 MG per tablet    Sig: Take 1 tablet by mouth daily as needed.  . metFORMIN (GLUCOPHAGE) 1000 MG tablet    Sig: Take 1,000 mg by mouth 2 (two) times daily with a meal.  . albuterol-ipratropium (COMBIVENT) 18-103 MCG/ACT inhaler    Sig: Inhale 2 puffs into the lungs every 4 (four) hours as needed for wheezing or shortness of breath.  . benazepril (LOTENSIN) 5 MG tablet    Sig: Take 0.5  tablets (2.5 mg total) by mouth daily.    Dispense:  15 tablet    Refill:  3    Dusti Tetro  Thurmon Fair, MD, [facility] [phone] office [phone] pager

## 2013-11-30 ENCOUNTER — Ambulatory Visit (HOSPITAL_COMMUNITY)
Admission: RE | Admit: 2013-11-30 | Discharge: 2013-11-30 | Disposition: A | Discharge status: Home / Self Care | Payer: Medicare Other | Source: Ambulatory Visit | Attending: Cardiovascular Disease | Admitting: Cardiovascular Disease

## 2013-11-30 DIAGNOSIS — I509 Heart failure, unspecified: Secondary | ICD-10-CM | POA: Insufficient documentation

## 2013-11-30 DIAGNOSIS — R0602 Shortness of breath: Secondary | ICD-10-CM

## 2013-11-30 DIAGNOSIS — Z950 Presence of cardiac pacemaker: Secondary | ICD-10-CM | POA: Insufficient documentation

## 2013-11-30 DIAGNOSIS — I079 Rheumatic tricuspid valve disease, unspecified: Secondary | ICD-10-CM | POA: Insufficient documentation

## 2013-11-30 DIAGNOSIS — R079 Chest pain, unspecified: Secondary | ICD-10-CM | POA: Insufficient documentation

## 2013-11-30 DIAGNOSIS — I517 Cardiomegaly: Secondary | ICD-10-CM

## 2013-11-30 DIAGNOSIS — I2510 Atherosclerotic heart disease of native coronary artery without angina pectoris: Secondary | ICD-10-CM | POA: Insufficient documentation

## 2013-11-30 DIAGNOSIS — I442 Atrioventricular block, complete: Secondary | ICD-10-CM | POA: Insufficient documentation

## 2013-11-30 DIAGNOSIS — I447 Left bundle-branch block, unspecified: Secondary | ICD-10-CM

## 2013-11-30 NOTE — Progress Notes (Signed)
2D Echo Performed 11/30/2013    Auryn Paige, RCS  

## 2013-12-14 ENCOUNTER — Ambulatory Visit (INDEPENDENT_AMBULATORY_CARE_PROVIDER_SITE_OTHER): Payer: Medicare Other | Admitting: Internal Medicine

## 2013-12-14 ENCOUNTER — Encounter: Payer: Self-pay | Admitting: *Deleted

## 2013-12-14 ENCOUNTER — Encounter: Payer: Self-pay | Admitting: Internal Medicine

## 2013-12-14 VITALS — BP 120/78 | HR 75 | Ht 70.0 in | Wt 166.2 lb

## 2013-12-14 DIAGNOSIS — I5042 Chronic combined systolic (congestive) and diastolic (congestive) heart failure: Secondary | ICD-10-CM

## 2013-12-14 DIAGNOSIS — I442 Atrioventricular block, complete: Secondary | ICD-10-CM

## 2013-12-14 DIAGNOSIS — I509 Heart failure, unspecified: Secondary | ICD-10-CM

## 2013-12-14 NOTE — Progress Notes (Signed)
    HPI [name] is referred today by Dr. Croitoru for consideration of a BiV ICD vs PPM upgrade of his DDD PM. He has a h/o chronic systolic CHF, now class 3, CAD, s/p CABG in 2007, and worsening LV dysfunction. By echo in 2011 his EF was 35% and on most recent echo 2 months ago was 30%. He has not had syncope. He does have underlying CHB with pacing induced LBBB with a QRS duration of 190 ms. He denies chest pain. He is on maximal medical therapy. No Known Allergies   Current Outpatient Prescriptions  Medication Sig Dispense Refill  . albuterol-ipratropium (COMBIVENT) 18-103 MCG/ACT inhaler Inhale 2 puffs into the lungs every 4 (four) hours as needed for wheezing or shortness of breath.      . aspirin 81 MG tablet Take 81 mg by mouth every morning.       . atorvastatin (LIPITOR) 40 MG tablet Take 40 mg by mouth at bedtime.       . benazepril (LOTENSIN) 5 MG tablet Take 2.5 mg by mouth daily. Pt. read Rx wrong and has been taking 5 mg daily      . clopidogrel (PLAVIX) 75 MG tablet Take 75 mg by mouth every morning.       . glipiZIDE (GLUCOTROL XL) 5 MG 24 hr tablet Take 5 mg by mouth daily.      . HYDROcodone-acetaminophen (NORCO/VICODIN) 5-325 MG per tablet Take 1 tablet by mouth daily as needed.      . isosorbide mononitrate (IMDUR) 30 MG 24 hr tablet Take 30 mg by mouth daily.        . metFORMIN (GLUCOPHAGE) 1000 MG tablet Take 1,000 mg by mouth 2 (two) times daily with a meal.      . metoprolol (LOPRESSOR) 100 MG tablet Take 100 mg by mouth 2 (two) times daily.      . nitroGLYCERIN (NITROSTAT) 0.4 MG SL tablet Place 0.4 mg under the tongue every 5 (five) minutes as needed. For chest pain      . pantoprazole (PROTONIX) 40 MG tablet Take 40 mg by mouth every morning.       No current facility-administered medications for this visit.     Past Medical History  Diagnosis Date  . Coronary artery disease   . Myocardial infarction   . Psoriasis   . Hypertension   . Diabetes mellitus    . COPD (chronic obstructive pulmonary disease)   . Sleep apnea     c pap at home, does not use  . CHB (complete heart block) 02/11/12    Medtronic permanent pacemaker  . DOE (dyspnea on exertion)     NYHA class 2-3  . Atrial tachycardia   . Cardiomyopathy     mixed ischemic and nonischemic  . Dyslipidemia   . LBBB (left bundle branch block)     ROS:   All systems reviewed and negative except as noted in the HPI.   Past Surgical History  Procedure Laterality Date  . Coronary artery bypass graft  2007    x 3  . Cardiac catheterization  02/10/12    left CX: dominant w/40% prox. AV groove stenosis.The first & second marginal branches had stents visualized near the ostium of the vessel bu were functionally occluded. The remainder of the dominant CX had minor irregularities.  RCA: nondominant free of sign. disease. SVG to OM1 & OM2 sequentially was widely patent.  . [place] echocardiography  01/10/10    LV   systolic fx mod to severely reduced, EF 35-40%, AOV mildly sclerotic  . Nm myocar perf wall motion  10/02/2009    no significant ischemia     Family History  Problem Relation Age of Onset  . Cancer Father      History   Social History  . Marital Status: Married    Spouse Name: N/A    Number of Children: N/A  . Years of Education: N/A   Occupational History  . Not on file.   Social History Main Topics  . Smoking status: Former Smoker -- 36 years    Types: Cigars    Quit date: 05/25/2005  . Smokeless tobacco: Never Used  . Alcohol Use: No  . Drug Use: No  . Sexual Activity:    Other Topics Concern  . Not on file   Social History Narrative  . No narrative on file     BP 120/78  Pulse 75  Ht 5\' 10"  (1.778 m)  Wt 166 lb 3.2 oz (75.388 kg)  BMI 23.85 kg/m2  Physical Exam:  Well appearing 68 yo man, NAD HEENT: Unremarkable Neck:  7 cm JVD, no thyromegally Back:  No CVA tenderness Lungs:  Clear with no wheezes HEART:  Regular rate rhythm, no murmurs, no  rubs, no clicks, split S2. Abd:  soft, positive bowel sounds, no organomegally, no rebound, no guarding Ext:  2 plus pulses, no edema, no cyanosis, no clubbing Skin:  No rashes no nodules Neuro:  CN II through XII intact, motor grossly intact  EKG - NSR with pacing induced LBBB, QRS 190 ms.  DEVICE  Normal device function.  See PaceArt for details.   Assess/Plan:

## 2013-12-14 NOTE — Assessment & Plan Note (Signed)
I have discussed the treatment options with the patient. His EF of 30% technically qualifies him for a BiV ICD. The patient however states that he would rather not have this device which would require placing an extra ICD lead along with an LV lead unless he has to have it. I have recommended risk stratification with an EP study. If no inducible VT or VT, will plan BiV PPM. If he has inducible VT, then we would suggest BiV ICD.  I have discussed the indications, risk/benefits/goals/expectations of the procedure with the patient and he wishes to proceed.

## 2013-12-14 NOTE — Patient Instructions (Signed)
See patient instructions for procedure   Will have an EP Study with a  Bi-V upgrade

## 2013-12-21 ENCOUNTER — Encounter (HOSPITAL_COMMUNITY): Payer: Self-pay | Admitting: Pharmacy Technician

## 2013-12-27 ENCOUNTER — Other Ambulatory Visit (INDEPENDENT_AMBULATORY_CARE_PROVIDER_SITE_OTHER): Payer: Medicare Other

## 2013-12-27 DIAGNOSIS — I509 Heart failure, unspecified: Secondary | ICD-10-CM

## 2013-12-27 DIAGNOSIS — I5042 Chronic combined systolic (congestive) and diastolic (congestive) heart failure: Secondary | ICD-10-CM

## 2013-12-27 DIAGNOSIS — I442 Atrioventricular block, complete: Secondary | ICD-10-CM

## 2013-12-27 LAB — CBC WITH DIFFERENTIAL/PLATELET
BASOS ABS: 0.1 10*3/uL (ref 0.0–0.1)
Basophils Relative: 0.7 % (ref 0.0–3.0)
EOS ABS: 0.3 10*3/uL (ref 0.0–0.7)
Eosinophils Relative: 3.3 % (ref 0.0–5.0)
HCT: 43.9 % (ref 39.0–52.0)
HEMOGLOBIN: 14.7 g/dL (ref 13.0–17.0)
LYMPHS PCT: 21.6 % (ref 12.0–46.0)
Lymphs Abs: 1.8 10*3/uL (ref 0.7–4.0)
MCHC: 33.5 g/dL (ref 30.0–36.0)
MCV: 96.2 fl (ref 78.0–100.0)
MONO ABS: 0.7 10*3/uL (ref 0.1–1.0)
Monocytes Relative: 8.6 % (ref 3.0–12.0)
NEUTROS ABS: 5.4 10*3/uL (ref 1.4–7.7)
Neutrophils Relative %: 65.8 % (ref 43.0–77.0)
Platelets: 306 10*3/uL (ref 150.0–400.0)
RBC: 4.57 Mil/uL (ref 4.22–5.81)
RDW: 13.4 % (ref 11.5–14.6)
WBC: 8.2 10*3/uL (ref 4.5–10.5)

## 2013-12-27 LAB — BASIC METABOLIC PANEL
BUN: 14 mg/dL (ref 6–23)
CALCIUM: 9.5 mg/dL (ref 8.4–10.5)
CO2: 25 mEq/L (ref 19–32)
Chloride: 103 mEq/L (ref 96–112)
Creatinine, Ser: 0.7 mg/dL (ref 0.4–1.5)
GFR: 115.78 mL/min (ref >60.00)
GLUCOSE: 172 mg/dL — AB (ref 70–99)
Potassium: 4 mEq/L (ref 3.5–5.1)
SODIUM: 137 meq/L (ref 135–145)

## 2014-01-02 MED ORDER — CEFAZOLIN SODIUM-DEXTROSE 2-3 GM-% IV SOLR
2.0000 g | INTRAVENOUS | Status: DC
  Filled 2014-01-02: qty 50

## 2014-01-02 MED ORDER — SODIUM CHLORIDE 0.9 % IR SOLN
80.0000 mg | Status: DC
  Filled 2014-01-02: qty 2

## 2014-01-03 ENCOUNTER — Ambulatory Visit (HOSPITAL_COMMUNITY)
Admission: RE | Admit: 2014-01-03 | Discharge: 2014-01-04 | Disposition: A | Discharge status: Home / Self Care | Payer: Medicare Other | Source: Ambulatory Visit | Attending: Internal Medicine | Admitting: Internal Medicine

## 2014-01-03 ENCOUNTER — Encounter (HOSPITAL_COMMUNITY)
Admission: RE | Disposition: A | Discharge status: Home / Self Care | Payer: Medicare Other | Source: Ambulatory Visit | Attending: Internal Medicine

## 2014-01-03 ENCOUNTER — Encounter (HOSPITAL_COMMUNITY): Payer: Self-pay | Admitting: General Practice

## 2014-01-03 DIAGNOSIS — Z7982 Long term (current) use of aspirin: Secondary | ICD-10-CM | POA: Insufficient documentation

## 2014-01-03 DIAGNOSIS — I442 Atrioventricular block, complete: Secondary | ICD-10-CM

## 2014-01-03 DIAGNOSIS — I2589 Other forms of chronic ischemic heart disease: Secondary | ICD-10-CM | POA: Insufficient documentation

## 2014-01-03 DIAGNOSIS — R0989 Other specified symptoms and signs involving the circulatory and respiratory systems: Secondary | ICD-10-CM

## 2014-01-03 DIAGNOSIS — Z951 Presence of aortocoronary bypass graft: Secondary | ICD-10-CM | POA: Insufficient documentation

## 2014-01-03 DIAGNOSIS — J4489 Other specified chronic obstructive pulmonary disease: Secondary | ICD-10-CM | POA: Insufficient documentation

## 2014-01-03 DIAGNOSIS — Z7902 Long term (current) use of antithrombotics/antiplatelets: Secondary | ICD-10-CM | POA: Insufficient documentation

## 2014-01-03 DIAGNOSIS — I10 Essential (primary) hypertension: Secondary | ICD-10-CM | POA: Insufficient documentation

## 2014-01-03 DIAGNOSIS — I447 Left bundle-branch block, unspecified: Secondary | ICD-10-CM | POA: Insufficient documentation

## 2014-01-03 DIAGNOSIS — I5042 Chronic combined systolic (congestive) and diastolic (congestive) heart failure: Secondary | ICD-10-CM

## 2014-01-03 DIAGNOSIS — G4733 Obstructive sleep apnea (adult) (pediatric): Secondary | ICD-10-CM | POA: Insufficient documentation

## 2014-01-03 DIAGNOSIS — L408 Other psoriasis: Secondary | ICD-10-CM | POA: Insufficient documentation

## 2014-01-03 DIAGNOSIS — I5022 Chronic systolic (congestive) heart failure: Secondary | ICD-10-CM

## 2014-01-03 DIAGNOSIS — J449 Chronic obstructive pulmonary disease, unspecified: Secondary | ICD-10-CM

## 2014-01-03 DIAGNOSIS — I509 Heart failure, unspecified: Secondary | ICD-10-CM | POA: Insufficient documentation

## 2014-01-03 DIAGNOSIS — I2510 Atherosclerotic heart disease of native coronary artery without angina pectoris: Secondary | ICD-10-CM | POA: Insufficient documentation

## 2014-01-03 DIAGNOSIS — Z87891 Personal history of nicotine dependence: Secondary | ICD-10-CM | POA: Insufficient documentation

## 2014-01-03 DIAGNOSIS — I498 Other specified cardiac arrhythmias: Secondary | ICD-10-CM | POA: Insufficient documentation

## 2014-01-03 DIAGNOSIS — E119 Type 2 diabetes mellitus without complications: Secondary | ICD-10-CM

## 2014-01-03 DIAGNOSIS — E785 Hyperlipidemia, unspecified: Secondary | ICD-10-CM

## 2014-01-03 DIAGNOSIS — R0609 Other forms of dyspnea: Secondary | ICD-10-CM

## 2014-01-03 DIAGNOSIS — I480 Paroxysmal atrial fibrillation: Secondary | ICD-10-CM

## 2014-01-03 DIAGNOSIS — I219 Acute myocardial infarction, unspecified: Secondary | ICD-10-CM

## 2014-01-03 DIAGNOSIS — I252 Old myocardial infarction: Secondary | ICD-10-CM | POA: Insufficient documentation

## 2014-01-03 DIAGNOSIS — I25810 Atherosclerosis of coronary artery bypass graft(s) without angina pectoris: Secondary | ICD-10-CM

## 2014-01-03 DIAGNOSIS — L409 Psoriasis, unspecified: Secondary | ICD-10-CM

## 2014-01-03 DIAGNOSIS — I428 Other cardiomyopathies: Secondary | ICD-10-CM | POA: Insufficient documentation

## 2014-01-03 HISTORY — PX: ELECTROPHYSIOLOGY STUDY: SHX5467

## 2014-01-03 HISTORY — DX: Heart failure, unspecified: I50.9

## 2014-01-03 HISTORY — DX: Type 2 diabetes mellitus without complications: E11.9

## 2014-01-03 HISTORY — PX: ELECTROPHYSIOLOGY STUDY: SHX5802

## 2014-01-03 HISTORY — DX: Acute embolism and thrombosis of unspecified deep veins of unspecified lower extremity: I82.409

## 2014-01-03 HISTORY — DX: Gastro-esophageal reflux disease without esophagitis: K21.9

## 2014-01-03 LAB — GLUCOSE, CAPILLARY
GLUCOSE-CAPILLARY: 81 mg/dL (ref 70–99)
GLUCOSE-CAPILLARY: 152 mg/dL — AB (ref 70–99)
Glucose-Capillary: 153 mg/dL — ABNORMAL HIGH (ref 70–99)
Glucose-Capillary: 160 mg/dL — ABNORMAL HIGH (ref 70–99)

## 2014-01-03 LAB — SURGICAL PCR SCREEN
MRSA, PCR: NEGATIVE
STAPHYLOCOCCUS AUREUS: NEGATIVE

## 2014-01-03 SURGERY — ELECTROPHYSIOLOGY STUDY
Anesthesia: LOCAL

## 2014-01-03 MED ORDER — MUPIROCIN 2 % EX OINT
TOPICAL_OINTMENT | Freq: Two times a day (BID) | CUTANEOUS | Status: DC
  Filled 2014-01-03: qty 22

## 2014-01-03 MED ORDER — MIDAZOLAM HCL 5 MG/5ML IJ SOLN
INTRAMUSCULAR | Status: AC
  Filled 2014-01-03: qty 5

## 2014-01-03 MED ORDER — BUPIVACAINE HCL (PF) 0.25 % IJ SOLN
INTRAMUSCULAR | Status: AC
  Filled 2014-01-03: qty 30

## 2014-01-03 MED ORDER — ACETAMINOPHEN 325 MG PO TABS: 325.0000 mg | ORAL_TABLET | ORAL | Status: DC | PRN

## 2014-01-03 MED ORDER — SODIUM CHLORIDE 0.9 % IV SOLN
INTRAVENOUS | Status: DC
  Administered 2014-01-03 (×2): via INTRAVENOUS

## 2014-01-03 MED ORDER — IPRATROPIUM-ALBUTEROL 0.5-2.5 (3) MG/3ML IN SOLN: 3.0000 mL | RESPIRATORY_TRACT | Status: DC | PRN

## 2014-01-03 MED ORDER — BENAZEPRIL HCL 5 MG PO TABS
5.0000 mg | ORAL_TABLET | Freq: Every day | ORAL | Status: DC
  Administered 2014-01-04: 10:00:00 5 mg via ORAL
  Filled 2014-01-03: qty 1

## 2014-01-03 MED ORDER — FENTANYL CITRATE 0.05 MG/ML IJ SOLN
INTRAMUSCULAR | Status: AC
  Filled 2014-01-03: qty 2

## 2014-01-03 MED ORDER — YOU HAVE A PACEMAKER BOOK
Freq: Once | Status: DC
  Filled 2014-01-03 (×2): qty 1

## 2014-01-03 MED ORDER — ONDANSETRON HCL 4 MG/2ML IJ SOLN: 4.0000 mg | Freq: Four times a day (QID) | INTRAMUSCULAR | Status: DC | PRN

## 2014-01-03 MED ORDER — GLIPIZIDE ER 5 MG PO TB24
5.0000 mg | ORAL_TABLET | Freq: Every day | ORAL | Status: DC
  Administered 2014-01-04: 5 mg via ORAL
  Filled 2014-01-03 (×2): qty 1

## 2014-01-03 MED ORDER — MUPIROCIN 2 % EX OINT
TOPICAL_OINTMENT | CUTANEOUS | Status: AC
  Administered 2014-01-03: 1 via NASAL
  Filled 2014-01-03: qty 22

## 2014-01-03 MED ORDER — LIDOCAINE HCL (PF) 1 % IJ SOLN
INTRAMUSCULAR | Status: AC
  Filled 2014-01-03: qty 30

## 2014-01-03 MED ORDER — ATORVASTATIN CALCIUM 40 MG PO TABS
40.0000 mg | ORAL_TABLET | Freq: Every day | ORAL | Status: DC
  Administered 2014-01-03: 23:00:00 40 mg via ORAL
  Filled 2014-01-03 (×2): qty 1

## 2014-01-03 MED ORDER — PANTOPRAZOLE SODIUM 40 MG PO TBEC: 40.0000 mg | DELAYED_RELEASE_TABLET | Freq: Every day | ORAL | Status: DC

## 2014-01-03 MED ORDER — CEFAZOLIN SODIUM-DEXTROSE 2-3 GM-% IV SOLR
2.0000 g | Freq: Four times a day (QID) | INTRAVENOUS | Status: AC
  Administered 2014-01-03 – 2014-01-04 (×3): 2 g via INTRAVENOUS
  Filled 2014-01-03 (×3): qty 50

## 2014-01-03 MED ORDER — METOPROLOL TARTRATE 100 MG PO TABS
100.0000 mg | ORAL_TABLET | Freq: Two times a day (BID) | ORAL | Status: DC
  Administered 2014-01-03 – 2014-01-04 (×2): 100 mg via ORAL
  Filled 2014-01-03 (×3): qty 1

## 2014-01-03 MED ORDER — HYDROCODONE-ACETAMINOPHEN 5-325 MG PO TABS
1.0000 | ORAL_TABLET | Freq: Four times a day (QID) | ORAL | Status: DC | PRN
  Administered 2014-01-03 – 2014-01-04 (×3): 1 via ORAL
  Filled 2014-01-03 (×3): qty 1

## 2014-01-03 MED ORDER — ISOSORBIDE MONONITRATE ER 30 MG PO TB24
30.0000 mg | ORAL_TABLET | Freq: Every day | ORAL | Status: DC
  Administered 2014-01-04: 30 mg via ORAL
  Filled 2014-01-03: qty 1

## 2014-01-03 MED ORDER — HEPARIN (PORCINE) IN NACL 2-0.9 UNIT/ML-% IJ SOLN
INTRAMUSCULAR | Status: AC
Start: 2014-01-03 — End: 2014-01-03
  Filled 2014-01-03: qty 500

## 2014-01-03 MED ORDER — CLOPIDOGREL BISULFATE 75 MG PO TABS
75.0000 mg | ORAL_TABLET | Freq: Every day | ORAL | Status: DC
  Filled 2014-01-03: qty 1

## 2014-01-03 NOTE — Discharge Instructions (Signed)
° °  Supplemental Discharge Instructions for  Pacemaker/Defibrillator Patients  Activity No heavy lifting or vigorous activity with your left/right arm for 6 to 8 weeks.  Do not raise your left/right arm above your head for one week.  Gradually raise your affected arm as drawn below.           03/14                      03/15                       03/16                      03/17       NO DRIVING for 1 week; you may begin driving on 60/63/0160. WOUND CARE   Keep the wound area clean and dry.  Do not get this area wet for one week. No showers for one week; you may shower on 01/12/2014.   The tape/steri-strips on your wound will fall off; do not pull them off.  No bandage is needed on the site.  DO  NOT apply any creams, oils, or ointments to the wound area.   If you notice any drainage or discharge from the wound, any swelling or bruising at the site, or you develop a fever > 101? F after you are discharged home, call the office at once.  Special Instructions   You are still able to use cellular telephones; use the ear opposite the side where you have your pacemaker/defibrillator.  Avoid carrying your cellular phone near your device.   When traveling through airports, show security personnel your identification card to avoid being screened in the metal detectors.  Ask the security personnel to use the hand wand.   Avoid arc welding equipment, MRI testing (magnetic resonance imaging), TENS units (transcutaneous nerve stimulators).  Call the office for questions about other devices.   Avoid electrical appliances that are in poor condition or are not properly grounded.   Microwave ovens are safe to be near or to operate.

## 2014-01-03 NOTE — Progress Notes (Signed)
Noted with swelling /hematoma of left chest incission site. Dr Ladona Ridgel notified. [employer] PA seen pt. No increase in bleeding noted at incission site when dsg removed . Pt c/o tenderness on palpation,  pressure dsg applied on site by above PA. Pt tolerated well.

## 2014-01-03 NOTE — H&P (View-Only) (Signed)
HPI [name] is referred today by Dr. Royann Shiversroitoru for consideration of a BiV ICD vs PPM upgrade of his DDD PM. He has a h/o chronic systolic CHF, now class 3, CAD, s/p CABG in 2007, and worsening LV dysfunction. By echo in 2011 his EF was 35% and on most recent echo 2 months ago was 30%. He has not had syncope. He does have underlying CHB with pacing induced LBBB with a QRS duration of 190 ms. He denies chest pain. He is on maximal medical therapy. No Known Allergies   Current Outpatient Prescriptions  Medication Sig Dispense Refill  . albuterol-ipratropium (COMBIVENT) 18-103 MCG/ACT inhaler Inhale 2 puffs into the lungs every 4 (four) hours as needed for wheezing or shortness of breath.      [name]. aspirin 81 MG tablet Take 81 mg by mouth every morning.       [name]. atorvastatin (LIPITOR) 40 MG tablet Take 40 mg by mouth at bedtime.       . benazepril (LOTENSIN) 5 MG tablet Take 2.5 mg by mouth daily. Pt. read Rx wrong and has been taking 5 mg daily      . clopidogrel (PLAVIX) 75 MG tablet Take 75 mg by mouth every morning.       [name]. glipiZIDE (GLUCOTROL XL) 5 MG 24 hr tablet Take 5 mg by mouth daily.      [name]. HYDROcodone-acetaminophen (NORCO/VICODIN) 5-325 MG per tablet Take 1 tablet by mouth daily as needed.      . isosorbide mononitrate (IMDUR) 30 MG 24 hr tablet Take 30 mg by mouth daily.        . metFORMIN (GLUCOPHAGE) 1000 MG tablet Take 1,000 mg by mouth 2 (two) times daily with a meal.      . metoprolol (LOPRESSOR) 100 MG tablet Take 100 mg by mouth 2 (two) times daily.      . nitroGLYCERIN (NITROSTAT) 0.4 MG SL tablet Place 0.4 mg under the tongue every 5 (five) minutes as needed. For chest pain      . pantoprazole (PROTONIX) 40 MG tablet Take 40 mg by mouth every morning.       No current facility-administered medications for this visit.     Past Medical History  Diagnosis Date  . Coronary artery disease   . Myocardial infarction   . Psoriasis   . Hypertension   . Diabetes mellitus    . COPD (chronic obstructive pulmonary disease)   . Sleep apnea     c pap at home, does not use  . CHB (complete heart block) 02/11/12    Medtronic permanent pacemaker  . DOE (dyspnea on exertion)     NYHA class 2-3  . Atrial tachycardia   . Cardiomyopathy     mixed ischemic and nonischemic  . Dyslipidemia   . LBBB (left bundle branch block)     ROS:   All systems reviewed and negative except as noted in the HPI.   Past Surgical History  Procedure Laterality Date  . Coronary artery bypass graft  2007    x 3  . Cardiac catheterization  02/10/12    left CX: dominant w/40% prox. AV groove stenosis.The first & second marginal branches had stents visualized near the ostium of the vessel bu were functionally occluded. The remainder of the dominant CX had minor irregularities.  RCA: nondominant free of sign. disease. SVG to OM1 & OM2 sequentially was widely patent.  . [place] echocardiography  01/10/10    LV  systolic fx mod to severely reduced, EF 35-40%, AOV mildly sclerotic  . Nm myocar perf wall motion  10/02/2009    no significant ischemia     Family History  Problem Relation Age of Onset  . Cancer Father      History   Social History  . Marital Status: Married    Spouse Name: N/A    Number of Children: N/A  . Years of Education: N/A   Occupational History  . Not on file.   Social History Main Topics  . Smoking status: Former Smoker -- 36 years    Types: Cigars    Quit date: 05/25/2005  . Smokeless tobacco: Never Used  . Alcohol Use: No  . Drug Use: No  . Sexual Activity:    Other Topics Concern  . Not on file   Social History Narrative  . No narrative on file     BP 120/78  Pulse 75  Ht 5\' 10"  (1.778 m)  Wt 166 lb 3.2 oz (75.388 kg)  BMI 23.85 kg/m2  Physical Exam:  Well appearing 68 yo man, NAD HEENT: Unremarkable Neck:  7 cm JVD, no thyromegally Back:  No CVA tenderness Lungs:  Clear with no wheezes HEART:  Regular rate rhythm, no murmurs, no  rubs, no clicks, split S2. Abd:  soft, positive bowel sounds, no organomegally, no rebound, no guarding Ext:  2 plus pulses, no edema, no cyanosis, no clubbing Skin:  No rashes no nodules Neuro:  CN II through XII intact, motor grossly intact  EKG - NSR with pacing induced LBBB, QRS 190 ms.  DEVICE  Normal device function.  See PaceArt for details.   Assess/Plan:

## 2014-01-03 NOTE — CV Procedure (Signed)
Invasive EP study and insertion of a LV pacing lead, new BiV PPM, and removal of an old DDD PM without immediate complication using contrast venography. [id number].

## 2014-01-03 NOTE — Interval H&P Note (Signed)
History and Physical Interval Note:   01/03/2014 7:35 AM  [name]  has presented today for surgery, with the diagnosis of complete heart block and heart failure  The various methods of treatment have been discussed with the patient and family. After consideration of risks, benefits and other options for treatment, the patient has consented to  Procedure(s): ELECTROPHYSIOLOGY STUDY (N/A) and upgrade from a BiV PPM/ICD as a surgical intervention .  The patient's history has been reviewed, patient examined, no change in status, stable for surgery.  I have reviewed the patient's chart and labs.  Questions were answered to the patient's satisfaction.     [name]

## 2014-01-04 ENCOUNTER — Ambulatory Visit (HOSPITAL_COMMUNITY): Payer: Medicare Other

## 2014-01-04 DIAGNOSIS — I442 Atrioventricular block, complete: Secondary | ICD-10-CM

## 2014-01-04 LAB — GLUCOSE, CAPILLARY: Glucose-Capillary: 176 mg/dL — ABNORMAL HIGH (ref 70–99)

## 2014-01-04 MED ORDER — METFORMIN HCL 1000 MG PO TABS: 1000.0000 mg | ORAL_TABLET | Freq: Two times a day (BID) | ORAL | Status: DC

## 2014-01-04 MED ORDER — ASPIRIN 81 MG PO TABS: 81.0000 mg | ORAL_TABLET | ORAL | Status: DC

## 2014-01-04 MED ORDER — HYDROCODONE-ACETAMINOPHEN 5-325 MG PO TABS: 1.0000 | ORAL_TABLET | Freq: Four times a day (QID) | ORAL | Status: DC | PRN

## 2014-01-04 MED ORDER — CLOPIDOGREL BISULFATE 75 MG PO TABS: 75.0000 mg | ORAL_TABLET | ORAL | Status: DC

## 2014-01-04 NOTE — Op Note (Signed)
NAMBeaulah Dinning:  Bazar, [place]                  ACCOUNT NO.:  [id number]  MEDICAL RECORD NO.:  [id number]  LOCATION:  6C02C                        FACILITY:  [facility]  PHYSICIAN:  Doylene CanningGregg W. Ladona Ridgelaylor, MD    DATE OF BIRTH:  June 18, 1946  DATE OF PROCEDURE:  01/03/2014 DATE OF DISCHARGE:                              OPERATIVE REPORT   PROCEDURE PERFORMED:  Electrophysiologic study, followed by upgrade of a dual-chamber pacemaker to a biventricular pacemaker with insertion of a new left ventricular pacing lead, removal of the old dual-chamber pacemaker with pacemaker pocket revision to accommodate the larger device, and insertion of a new biventricular pacemaker utilizing IV contrast venography of the left upper extremity venous system and the coronary sinus.  INTRODUCTION:  The patient is a very pleasant 68 year old male with ischemic heart disease and ejection fraction of 35%.  He has class III heart failure symptoms and a pacing induced left bundle-branch block with a QRS duration of 190 milliseconds.  He is now referred for upgrade of his dual-chamber pacemaker to a biventricular pacemaker versus biventricular ICD.  The plan will be for the patient to undergo invasive electrophysiologic study and if negative, Bi V pacemaker and if positive a BiV ICD.  DESCRIPTION OF PROCEDURE:  After informed was obtained, the patient was taken to the diagnostic EP lab in a fasting state.  After usual preparation and draping, a 6-French quadripolar catheter was inserted percutaneously into the right femoral vein and advanced to the right ventricle.  A 6-French quadripolar catheter was inserted percutaneously into the right femoral vein and advanced to the His bundle region. After measuring the basic intervals, rapid ventricular pacing was carried out from the right ventricle at a base drive cycle length of 161600 milliseconds and stepwise decreased down to 260 milliseconds.  During rapid ventricular pacing, there was VA  dissociation at 600 milliseconds, and there was no inducible VT.  Next, programed ventricular stimulation was carried out from the right ventricle at a base drive cycle length of 096540 milliseconds.  S1-S2, S1-S2-S3, and S1-S2-S3-S4 stimuli were delivered with the S1-S2, S2-S3, and S3-S4 interval stepwise decreased down to ventricular refractoriness.  During programmed ventricular stimulation from the right ventricle apex as well as the right ventricular inflow tract, there was no inducible VT.  Rapid atrial pacing was carried out demonstrating AV Wenckebach at 600 milliseconds. Programmed atrial stimulation was carried out demonstrating AV dissociation at 600 milliseconds.  With all of the above, the catheters were removed, and the patient was prepped for upgrade to a biventricular pacemaker.  After usual preparation and draping, intravenous fentanyl and midazolam was given for additional sedation.  A 30 mL of lidocaine was infiltrated into the left infraclavicular region.  A 5-cm incision was carried out over this region.  Venography was then carried out demonstrating that the left subclavian vein was patent.  The vein was then punctured and the Medtronic MB2 guiding catheter along with a 6-French hexapolar EP catheter were advanced under fluoroscopic guidance into the right atrium.  The coronary sinus was cannulated without particular difficulty.  Venography of the coronary sinus was then carried out.  It demonstrated a posterior vein followed by a  valve and a very small high lateral vein, and an anterior vein.  The posterior vein was selected for LV lead placement.  The vein itself was cannulated with modest difficulty and the Medtronic, model 4194 88-cm bipolar pacing lead, serial #[id number] was advanced under fluoroscopic guidance into the posterior vein of the left ventricle.  It was advanced approximately halfway from base to apex in this location, the pacing threshold was less  than a V at 0.5 milliseconds.  Diaphragmatic stimulation was noticed at multiple sites along its posterior vein from 1/2 to 2/3 base to apex.  After additional manipulation of the LV lead, a final location was made and the lead was liberated from the guiding catheter and secured to the subpectoral fascia with a figure-of-eight silk suture.  The sewing sleeve was secured with silk suture.  Electrocautery was then utilized to enter the pacemaker pocket and free up the dense fibrous adhesions along the atrial and the RV pacing lead.  Having done this, the pocket was irrigated.  The new Medtronic BiV pacemaker, serial # [id number] was connected to the right atrial LV and RV leads and placed back in the subcutaneous pocket.  The pocket was irrigated with additional antibiotic irrigation and electrocautery was utilized to assure hemostasis.  The incision was closed with 2-0 and 3-0 Vicryl.  It should be noted that 5 V pacing did stimulate the diaphragm, but 2 V pacing did not typically.  COMPLICATIONS:  There were no immediate procedure complications.  RESULTS:  This demonstrates a negative invasive electrophysiologic study followed by successful insertion of a biventricular pacemaker with new left ventricular lead placement, pacemaker pocket revision, venography of both the coronary sinus and the left upper extremity venous system all without immediate procedure complication.     Doylene Canning. Ladona Ridgel, MD     GWT/MEDQ  D:  01/03/2014  T:  01/04/2014  Job:  [id number]  cc:   Thurmon Fair, MD

## 2014-01-04 NOTE — Discharge Summary (Signed)
ELECTROPHYSIOLOGY DISCHARGE SUMMARY   Patient ID: [name],  MRN: [id number], DOB/AGE: [birth year] 67 y.o.  Admit date: 01/03/2014 Discharge date: 01/04/2014  Primary Care Physician: Henrine Screwsobert Thacker, MD Primary Cardiologist: Royann Shiversroitoru, MD Primary EP: Ladona Ridgelaylor, MD  Primary Discharge Diagnosis:  1. Complete heart block in setting of an ischemic CM, EF 35%, chronic systolic HF and LBBB, now s/p upgrade to CRT-P  Secondary Discharge Diagnoses:  1. Complete heart block s/p dual chamber PPM implant previously 2. Ischemic CM, EF 35% 3. Chronic systolic HF, NYHA class III 4. LBBB 5. CAD s/p CABG 2007 6. HTN 7. OSA 8. Dyslipidemia 9. DM 10. COPD  Procedures This Admission:  1. Invasive EP study and insertion of a LV pacing lead, new BiV PPM, and removal of an old DDD PM without immediate complication using contrast venography RESULTS:  This demonstrates a negative invasive electrophysiologic study  followed by successful insertion of a biventricular pacemaker with new  left ventricular lead placement, pacemaker pocket revision, venography  of both the coronary sinus and the left upper extremity venous system  all without immediate procedure complication. LV lead - Medtronic, model 4194 88-cm bipolar pacing lead serial # [id number] Device - Medtronic BiV pacemaker serial # [id number] S   History and Hospital Course:  [name] is a pleasant 68 year old man with an ischemic CM with EF 35% and complete heart block s/p dual chamber PPM implant previously. He has NYHA class III heart failure symptoms and a pacing induced left bundle-branch block with a QRS duration of 190 milliseconds. He presented yesterday for upgrade of his dual-chamber pacemaker to a biventricular pacemaker versus biventricular ICD. He underwent an invasive electrophysiologic study prior to upgrade which was negative for inducible VT. Therefore, upgrade to CRT-P was done. [name] tolerated this procedure well without any  immediate complication. He remains hemodynamically stable and afebrile. His chest xray shows stable lead placement without pneumothorax. His device interrogation shows normal BiV PPM function with stable lead parameters/measurements. His implant site is intact without significant bleeding or hematoma. He has been given discharge instructions including wound care and activity restrictions. He will follow-up in 10 days for wound check. There were no changes made to his medications. He has been seen, examined and deemed stable for discharge today by Dr. Lewayne BuntingGregg Parke Jandreau. Of note, [name] will be given a short term prescription for Vicodin to use every 6 hours for moderate-moderately severe pain at CRT-P implant site (dispense #10, no refills). He was counseled regarding use of pain medications and further refills will need to be approved / provided by PCP.   Physical Exam: Vitals: Blood pressure 126/66, pulse 63, temperature 98.3 F (36.8 C), temperature source Oral, resp. rate 20, height 5\' 10"  (1.778 m), weight 166 lb 14.2 oz (75.7 kg), SpO2 93.00%.  General: Well developed, well appearing 68 year old male in no acute distress. Neck: Supple. JVD not elevated. Lungs: Clear bilaterally to auscultation without wheezes, rales, or rhonchi. Breathing is unlabored. Heart: RRR S1 S2 without murmurs, rubs, or gallops.  Abdomen: Soft, non-distended. Extremities: No clubbing or cyanosis. No edema.  Distal pedal pulses are 2+ and equal bilaterally. Neuro: Alert and oriented X 3. Moves all extremities spontaneously. No focal deficits. Skin: Left upper chest / implant site intact without significant bleeding or hematoma.  Labs: Lab Results  Component Value Date   WBC 8.2 12/27/2013   HGB 14.7 12/27/2013   HCT 43.9 12/27/2013   MCV 96.2 12/27/2013   PLT 306.0  12/27/2013      Component Value Date/Time   NA 137 12/27/2013 1044   K 4.0 12/27/2013 1044   CL 103 12/27/2013 1044   CO2 25 12/27/2013 1044   GLUCOSE 172* 12/27/2013  1044   BUN 14 12/27/2013 1044   CREATININE 0.7 12/27/2013 1044   CALCIUM 9.5 12/27/2013 1044   GFRNONAA >90 02/11/2012 0500   GFRAA >90 02/11/2012 0500    Disposition:  The patient is being discharged in stable condition.  Follow-up:     Follow-up Information   Follow up with [facility] On 01/15/2014. (At 9:30 AM for wound check)    Specialty:  Cardiology   Contact information:   [address] [place] [address] [phone]      Follow up with Thurmon Fair, MD On 02/16/2014. (At 4:00 PM for 6-week CHF follow-up)    Specialty:  Cardiology   Contact information:   [address] [place] [address] [phone]       Follow up with [name], MD On 04/12/2014. (At 10:00 AM for device / pacemaker follow-up)    Specialty:  Cardiology   Contact information:   [address] [place] [address] [phone]      Discharge Medications:    Medication List         aspirin 81 MG tablet  Take 81 mg by mouth every morning.     atorvastatin 40 MG tablet  Commonly known as:  LIPITOR  Take 40 mg by mouth at bedtime.     benazepril 5 MG tablet  Commonly known as:  LOTENSIN  Take 5 mg by mouth daily.     clopidogrel 75 MG tablet  Commonly known as:  PLAVIX  Take 75 mg by mouth every morning.     COMBIVENT 18-103 MCG/ACT inhaler  Generic drug:  albuterol-ipratropium  Inhale 2 puffs into the lungs every 4 (four) hours as needed for wheezing or shortness of breath.     glipiZIDE 5 MG 24 hr tablet  Commonly known as:  GLUCOTROL XL  Take 5 mg by mouth daily.     HYDROcodone-acetaminophen 5-325 MG per tablet  Commonly known as:  NORCO/VICODIN  Take 1 tablet by mouth every 6 (six) hours as needed for moderate pain.     HYDROcodone-acetaminophen 5-325 MG per tablet  Commonly known as:  NORCO/VICODIN  Take 1 tablet by mouth every 6 (six) hours as needed for moderate pain.     isosorbide mononitrate 30  MG 24 hr tablet  Commonly known as:  IMDUR  Take 30 mg by mouth daily.     metFORMIN 1000 MG tablet  Commonly known as:  GLUCOPHAGE  Take 1 tablet (1,000 mg total) by mouth 2 (two) times daily with a meal. Hold for 2 days. Restart on Sat, 01/06/2014.     metoprolol 100 MG tablet  Commonly known as:  LOPRESSOR  Take 100 mg by mouth 2 (two) times daily.     nitroGLYCERIN 0.4 MG SL tablet  Commonly known as:  NITROSTAT  Place 0.4 mg under the tongue every 5 (five) minutes as needed. For chest pain     pantoprazole 40 MG tablet  Commonly known as:  PROTONIX  Take 40 mg by mouth every morning.       Duration of Discharge Encounter: Greater than 30 minutes including physician time.  Signed, Rick Duff, PA-C 01/04/2014, 8:02 AM  EP Attending  Patient seen and  examined. Agree with above. LV lead in good position. Hopefully diaphragmatic stimulation will not be an issue. Ok for discharge home.  Leonia Reeves.D.

## 2014-01-05 ENCOUNTER — Encounter (HOSPITAL_COMMUNITY): Payer: Self-pay | Admitting: *Deleted

## 2014-01-15 ENCOUNTER — Ambulatory Visit (HOSPITAL_COMMUNITY)
Admission: RE | Admit: 2014-01-15 | Discharge: 2014-01-15 | Disposition: A | Discharge status: Home / Self Care | Payer: Medicare Other | Source: Ambulatory Visit | Attending: Internal Medicine | Admitting: Internal Medicine

## 2014-01-15 ENCOUNTER — Ambulatory Visit (INDEPENDENT_AMBULATORY_CARE_PROVIDER_SITE_OTHER): Payer: Medicare Other | Admitting: *Deleted

## 2014-01-15 ENCOUNTER — Other Ambulatory Visit: Payer: Self-pay | Admitting: *Deleted

## 2014-01-15 ENCOUNTER — Ambulatory Visit
Admission: RE | Admit: 2014-01-15 | Discharge: 2014-01-15 | Disposition: A | Discharge status: Home / Self Care | Payer: Medicare Other | Source: Ambulatory Visit | Attending: Internal Medicine | Admitting: Internal Medicine

## 2014-01-15 DIAGNOSIS — T82190A Other mechanical complication of cardiac electrode, initial encounter: Secondary | ICD-10-CM

## 2014-01-15 DIAGNOSIS — I442 Atrioventricular block, complete: Secondary | ICD-10-CM

## 2014-01-15 DIAGNOSIS — T82110A Breakdown (mechanical) of cardiac electrode, initial encounter: Secondary | ICD-10-CM

## 2014-01-15 DIAGNOSIS — I5042 Chronic combined systolic (congestive) and diastolic (congestive) heart failure: Secondary | ICD-10-CM

## 2014-01-15 DIAGNOSIS — I509 Heart failure, unspecified: Secondary | ICD-10-CM

## 2014-01-15 DIAGNOSIS — Z45018 Encounter for adjustment and management of other part of cardiac pacemaker: Secondary | ICD-10-CM | POA: Insufficient documentation

## 2014-01-15 LAB — MDC_IDC_ENUM_SESS_TYPE_INCLINIC
Brady Statistic AP VP Percent: 3.59 %
Brady Statistic AS VP Percent: 96.39 %
Brady Statistic AS VS Percent: 0.02 %
Brady Statistic RA Percent Paced: 3.59 %
Lead Channel Impedance Value: 228 Ohm
Lead Channel Impedance Value: 342 Ohm
Lead Channel Impedance Value: 437 Ohm
Lead Channel Impedance Value: 437 Ohm
Lead Channel Impedance Value: 437 Ohm
Lead Channel Impedance Value: 494 Ohm
Lead Channel Impedance Value: 513 Ohm
Lead Channel Impedance Value: 532 Ohm
Lead Channel Pacing Threshold Amplitude: 0.5 V
Lead Channel Pacing Threshold Pulse Width: 0.4 ms
Lead Channel Sensing Intrinsic Amplitude: 3.75 mV
Lead Channel Setting Pacing Amplitude: 2.5 V
Lead Channel Setting Pacing Amplitude: 2.75 V
Lead Channel Setting Pacing Pulse Width: 0.4 ms
MDC IDC MSMT BATTERY VOLTAGE: 3.06 V
MDC IDC MSMT LEADCHNL LV PACING THRESHOLD AMPLITUDE: 2.25 V
MDC IDC MSMT LEADCHNL LV PACING THRESHOLD PULSEWIDTH: 0.8 ms
MDC IDC MSMT LEADCHNL RA IMPEDANCE VALUE: 494 Ohm
MDC IDC MSMT LEADCHNL RA PACING THRESHOLD PULSEWIDTH: 0.4 ms
MDC IDC MSMT LEADCHNL RV PACING THRESHOLD AMPLITUDE: 0.75 V
MDC IDC SESS DTM: 20150323101159
MDC IDC SET LEADCHNL LV PACING PULSEWIDTH: 0.8 ms
MDC IDC SET LEADCHNL RA PACING AMPLITUDE: 2.25 V
MDC IDC SET LEADCHNL RV SENSING SENSITIVITY: 4 mV
MDC IDC STAT BRADY AP VS PERCENT: 0 %
MDC IDC STAT BRADY RV PERCENT PACED: 99.98 %
Zone Setting Detection Interval: 350 ms
Zone Setting Detection Interval: 400 ms

## 2014-01-15 MED ORDER — BENAZEPRIL HCL 5 MG PO TABS: 5.0000 mg | ORAL_TABLET | Freq: Every day | ORAL | Status: DC

## 2014-01-15 NOTE — Progress Notes (Signed)
Wound check appointment. Steri-strips removed. Resolving hematoma with edema.   Incision edges approximated, wound well healed. Normal device function. Sensing, and impedances consistent with implant measurements. LV threshold 8.0@0 .8 LV ring -> RV ring.  2.25@ 0.8 LV tip -> RV ring.  Device programmed at 3.5V/auto capture programmed on for extra safety margin until 3 month visit. Histogram distribution appropriate for patient and level of activity. No mode switches or high ventricular rates noted. Patient educated about wound care, arm mobility, lifting restrictions. ROV in 3 months with implanting physician.  Patient to have CXR today for LV placement.

## 2014-01-16 ENCOUNTER — Telehealth: Payer: Self-pay | Admitting: Internal Medicine

## 2014-01-16 NOTE — Telephone Encounter (Signed)
New message     Wanted Dr Ladona Ridgel to know that he had his xray yesterday at [facility] and he is waiting a call with the results from [place].  Pt was seen yesterday and the doctor think he has a device wire loose.

## 2014-01-16 NOTE — Telephone Encounter (Signed)
[name] spoke with patient.  It has moved slightly but will keep a close watch on it.  Follow up in 2 weeks

## 2014-01-31 ENCOUNTER — Ambulatory Visit (INDEPENDENT_AMBULATORY_CARE_PROVIDER_SITE_OTHER): Payer: Medicare Other | Admitting: *Deleted

## 2014-01-31 DIAGNOSIS — I428 Other cardiomyopathies: Secondary | ICD-10-CM

## 2014-01-31 LAB — MDC_IDC_ENUM_SESS_TYPE_INCLINIC
Battery Remaining Longevity: 78 mo
Battery Voltage: 3.05 V
Brady Statistic RA Percent Paced: 7.74 %
Brady Statistic RV Percent Paced: 99.83 %
Lead Channel Impedance Value: 361 Ohm
Lead Channel Impedance Value: 513 Ohm
Lead Channel Impedance Value: 589 Ohm
Lead Channel Pacing Threshold Amplitude: 0.75 V
Lead Channel Pacing Threshold Pulse Width: 0.4 ms
Lead Channel Sensing Intrinsic Amplitude: 3 mV
Lead Channel Setting Pacing Amplitude: 2.75 V
Lead Channel Setting Pacing Amplitude: 3.5 V
Lead Channel Setting Pacing Pulse Width: 0.4 ms
MDC IDC MSMT LEADCHNL LV IMPEDANCE VALUE: 266 Ohm
MDC IDC MSMT LEADCHNL LV IMPEDANCE VALUE: 494 Ohm
MDC IDC MSMT LEADCHNL LV IMPEDANCE VALUE: 589 Ohm
MDC IDC MSMT LEADCHNL LV PACING THRESHOLD AMPLITUDE: 1.5 V
MDC IDC MSMT LEADCHNL LV PACING THRESHOLD PULSEWIDTH: 0.8 ms
MDC IDC MSMT LEADCHNL RA IMPEDANCE VALUE: 437 Ohm
MDC IDC MSMT LEADCHNL RA IMPEDANCE VALUE: 494 Ohm
MDC IDC MSMT LEADCHNL RA PACING THRESHOLD AMPLITUDE: 0.5 V
MDC IDC MSMT LEADCHNL RA PACING THRESHOLD PULSEWIDTH: 0.4 ms
MDC IDC MSMT LEADCHNL RA SENSING INTR AMPL: 3 mV
MDC IDC MSMT LEADCHNL RV IMPEDANCE VALUE: 456 Ohm
MDC IDC SESS DTM: 20150408105821
MDC IDC SET LEADCHNL LV PACING PULSEWIDTH: 0.8 ms
MDC IDC SET LEADCHNL RA PACING AMPLITUDE: 1.5 V
MDC IDC SET LEADCHNL RV SENSING SENSITIVITY: 4 mV
MDC IDC SET ZONE DETECTION INTERVAL: 400 ms
MDC IDC STAT BRADY AP VP PERCENT: 7.74 %
MDC IDC STAT BRADY AP VS PERCENT: 0 %
MDC IDC STAT BRADY AS VP PERCENT: 92.09 %
MDC IDC STAT BRADY AS VS PERCENT: 0.17 %
Zone Setting Detection Interval: 350 ms

## 2014-01-31 NOTE — Progress Notes (Signed)
Recheck today of LV threshold.  No programming changes made.  Follow up as scheduled.

## 2014-02-09 ENCOUNTER — Encounter: Payer: Self-pay | Admitting: Internal Medicine

## 2014-02-15 ENCOUNTER — Encounter: Payer: Self-pay | Admitting: Internal Medicine

## 2014-02-16 ENCOUNTER — Ambulatory Visit (INDEPENDENT_AMBULATORY_CARE_PROVIDER_SITE_OTHER): Payer: Medicare Other | Admitting: Cardiovascular Disease

## 2014-02-16 ENCOUNTER — Encounter: Payer: Self-pay | Admitting: Cardiovascular Disease

## 2014-02-16 VITALS — BP 132/70 | HR 80 | Ht 70.0 in | Wt 165.1 lb

## 2014-02-16 DIAGNOSIS — I25810 Atherosclerosis of coronary artery bypass graft(s) without angina pectoris: Secondary | ICD-10-CM

## 2014-02-16 DIAGNOSIS — I5042 Chronic combined systolic (congestive) and diastolic (congestive) heart failure: Secondary | ICD-10-CM

## 2014-02-16 DIAGNOSIS — T82110A Breakdown (mechanical) of cardiac electrode, initial encounter: Secondary | ICD-10-CM

## 2014-02-16 DIAGNOSIS — T82198A Other mechanical complication of other cardiac electronic device, initial encounter: Secondary | ICD-10-CM

## 2014-02-16 DIAGNOSIS — T82190A Other mechanical complication of cardiac electrode, initial encounter: Secondary | ICD-10-CM

## 2014-02-16 DIAGNOSIS — I442 Atrioventricular block, complete: Secondary | ICD-10-CM

## 2014-02-16 DIAGNOSIS — I509 Heart failure, unspecified: Secondary | ICD-10-CM

## 2014-02-16 DIAGNOSIS — Z95810 Presence of automatic (implantable) cardiac defibrillator: Secondary | ICD-10-CM

## 2014-02-16 DIAGNOSIS — I447 Left bundle-branch block, unspecified: Secondary | ICD-10-CM

## 2014-02-16 LAB — PACEMAKER DEVICE OBSERVATION

## 2014-02-16 NOTE — Patient Instructions (Signed)
A chest x-ray takes a picture of the organs and structures inside the chest, including the heart, lungs, and blood vessels. This test can show several things, including, whether the heart is enlarges; whether fluid is building up in the lungs; and whether defibrillator leads are still in place.  Your physician recommends that you schedule a follow-up appointment with Dr.Taylor on 02-20-2014 @ 8:30am.

## 2014-02-17 ENCOUNTER — Encounter: Payer: Self-pay | Admitting: Cardiovascular Disease

## 2014-02-17 DIAGNOSIS — T82110A Breakdown (mechanical) of cardiac electrode, initial encounter: Secondary | ICD-10-CM | POA: Insufficient documentation

## 2014-02-17 DIAGNOSIS — I442 Atrioventricular block, complete: Secondary | ICD-10-CM | POA: Insufficient documentation

## 2014-02-17 DIAGNOSIS — Z950 Presence of cardiac pacemaker: Secondary | ICD-10-CM | POA: Insufficient documentation

## 2014-02-17 NOTE — Progress Notes (Signed)
Patient ID: [name], male   DOB: 1946/05/11, 68 y.o.   MRN: [id number]      Reason for office visit CHF, CRT-D folow-up, CAD  Willi felt substantially improved for a couple of weeks following implantation of his left ventricular lead and upgrade of his device to a CRT-D. Subsequently he again developed fatigue and exertional dyspnea, functional class III. When his left ventricular lead threshold was tested on March 23 the pacing threshold was 2.25 V at 0.8 ms (LV tip to RV ring). This was a substantial change from implant when it was 0.75 V at 0.8 ms pulse width (note that pacing at 5 V produce diaphragmatic stimulation). Chest x-ray showed a minor change in position of the coronary sinus lead. Appropriate reprogramming was performed but he now feels poorly and is again short of breath this is functional class III). She inadvertently took a dose of benazepril 5 mg instead of the prescribed 2.5 mg and he has been hypotensive and symptomatic. He subsequently has not taken any ACE inhibitor for the last week. Today his blood pressure is 132/70.  He is pacemaker dependent. Pacing threshold testing in the ventricle is associated with near syncope.  Interrogation of the device today shows absence of capture at maximum pacing output in all available configurations (he does not have a quadripolar lead).  He presented with complete heart block in April of 2013 and required a temporary transvenous pacemaker, undergoing implantation of a dual-chamber permanent pacemaker the next day. for years before that he had had a permanent left bundle branch block. He has a long-standing history of coronary disease and underwent bypass surgery in 2007. Just before getting his pacemaker he underwent cardiac catheterization which showed a patent sequential saphenous vein graft bypass to the first and second oblique marginal arteries (both of these vessels had previously been stented and had severe in-stent restenosis).    Left ventricular ejection fraction had been 45-50% prior to that hospitalization. Following implantation of his pacemaker his echocardiogram showed moderately depressed left ventricular systolic function with an ejection fraction of 35-40%. He continues to have symptoms of shortness of breath on exertion (NYHA functional class III) but he has been working until today (he just retired). He briefly had return of A-V conduction for about a month, but now has nearly 100% ventricular pacing. He has no underlying escape rhythm today and is 100% paced. He requires relatively high doses of beta blockers for control of recurrent paroxysmal atrial tachycardia which was quite symptomatic. At the current dose of beta blocker it doesn't bother him at all. Attempts at treatment with ACE inhibitor in the past led to hypotension. He now seems to be able to tolerate them in very low doses only.    No Known Allergies  Current Outpatient Prescriptions  Medication Sig Dispense Refill  . albuterol-ipratropium (COMBIVENT) 18-103 MCG/ACT inhaler Inhale 2 puffs into the lungs every 4 (four) hours as needed for wheezing or shortness of breath.      [name] aspirin 81 MG tablet Take 1 tablet (81 mg total) by mouth every morning. Hold for 2 days. Resume Sat 01/06/2014.  30 tablet    . atorvastatin (LIPITOR) 40 MG tablet Take 40 mg by mouth at bedtime.       . benazepril (LOTENSIN) 5 MG tablet Take 1 tablet (5 mg total) by mouth daily.  90 tablet  3  . clopidogrel (PLAVIX) 75 MG tablet Take 1 tablet (75 mg total) by mouth every morning. Hold for  2 days. Resume Sat 01/06/2014.      . glipiZIDE (GLUCOTROL XL) 5 MG 24 hr tablet Take 5 mg by mouth daily.      [name] HYDROcodone-acetaminophen (NORCO/VICODIN) 5-325 MG per tablet Take 1 tablet by mouth every 6 (six) hours as needed for moderate pain.       . isosorbide mononitrate (IMDUR) 30 MG 24 hr tablet Take 30 mg by mouth daily.        . metFORMIN (GLUCOPHAGE) 1000 MG tablet Take 1 tablet  (1,000 mg total) by mouth 2 (two) times daily with a meal. Hold for 2 days. Restart on Sat, 01/06/2014.      . metoprolol (LOPRESSOR) 100 MG tablet Take 100 mg by mouth 2 (two) times daily.      . nitroGLYCERIN (NITROSTAT) 0.4 MG SL tablet Place 0.4 mg under the tongue every 5 (five) minutes as needed. For chest pain      . pantoprazole (PROTONIX) 40 MG tablet Take 40 mg by mouth daily as needed.        No current facility-administered medications for this visit.    Past Medical History  Diagnosis Date  . Coronary artery disease   . Psoriasis   . Hypertension   . COPD (chronic obstructive pulmonary disease)   . CHB (complete heart block) 02/11/12    Medtronic permanent pacemaker  . DOE (dyspnea on exertion)     NYHA class 2-3  . Atrial tachycardia   . Cardiomyopathy     mixed ischemic and nonischemic  . Dyslipidemia   . LBBB (left bundle branch block)   . CHF (congestive heart failure)   . Myocardial infarction     "I've had 6" (01/03/2014)  . DVT (deep venous thrombosis) 12/2005    RLE "had to have OR"  . Sleep apnea     c pap at home, does not use  . Type II diabetes mellitus   . GERD (gastroesophageal reflux disease)     Past Surgical History  Procedure Laterality Date  . Coronary artery bypass graft  2007    x 3  . Cardiac catheterization  02/10/12    left CX: dominant w/40% prox. AV groove stenosis.The first & second marginal branches had stents visualized near the ostium of the vessel bu were functionally occluded. The remainder of the dominant CX had minor irregularities.  RCA: nondominant free of sign. disease. SVG to OM1 & OM2 sequentially was widely patent.  . US echocardiography  01/10/10    LV systolic fx mod to severely reduced, EF 35-40%, AOV mildly sclerotic  . Nm myocar perf wall motion  10/02/2009    no significant ischemia  . Pacemaker insertion  2013; 01/03/2014    MDT dual chamber pacemaker implanted 2013 for complete heart block; upgrade to CRTP (MDT) by Dr  Ladona Ridgel 12/2013 due to cardiomyopathy and heart failure  . Coronary angioplasty with stent placement      "I had 9 stents before the OHS in 2007" (01/03/2014)  . Cataract extraction Left   . Refractive surgery Right   . Electrophysiology study  01/03/2014    EPS by Dr Ladona Ridgel with no inducible ventricular arrhythmias    Family History  Problem Relation Age of Onset  . Cancer Father     History   Social History  . Marital Status: Married    Spouse Name: N/A    Number of Children: N/A  . Years of Education: N/A   Occupational History  . Not on file.  Social History Main Topics  . Smoking status: Former Smoker -- 36 years    Types: Cigars    Quit date: 05/25/2005  . Smokeless tobacco: Never Used  . Alcohol Use: Yes     Comment: "quit alcohol in 1985 or 1986"  . Drug Use: No  . Sexual Activity: Not Currently   Other Topics Concern  . Not on file   Social History Narrative  . No narrative on file    Review of systems: He describes shortness of breath with relatively light activity (NYHA class III). He denies wheezing or coughing but he does use an inhaler regularly. He denies angina pectoris he did rest with exertion has not experienced palpitations recently. He has never had syncope. He denies lower showed edema, intermittent claudication, focal neurological deficits.  No cough, hemoptysis or wheezing.  The patient also denies abdominal pain, nausea, vomiting, dysphagia, diarrhea, constipation, polyuria, polydipsia, dysuria, hematuria, frequency, urgency, abnormal bleeding or bruising, fever, chills, unexpected weight changes, mood swings, change in skin or hair texture, change in voice quality, auditory or visual problems, allergic reactions or rashes, new musculoskeletal complaints other than usual "aches and pains".   PHYSICAL EXAM BP 132/70  Pulse 80  Ht 5\' 10"  (1.778 m)  Wt 165 lb 1.6 oz (74.889 kg)  BMI 23.69 kg/m2 General: Alert, oriented x3, no distress  Head: no  evidence of trauma, PERRL, EOMI, no exophtalmos or lid lag, no myxedema, no xanthelasma; normal ears, nose and oropharynx  Neck: normal jugular venous pulsations and no hepatojugular reflux; brisk carotid pulses without delay and no carotid bruits  Chest: clear to auscultation, no signs of consolidation by percussion or palpation, normal fremitus, symmetrical and full respiratory excursions, healthy appearing left subclavian pacemaker site  Cardiovascular: normal position and quality of the apical impulse, regular rhythm, normal first and paradoxically split second heart sounds, no murmurs, rubs or gallops  Abdomen: no tenderness or distention, no masses by palpation, no abnormal pulsatility or arterial bruits, normal bowel sounds, no hepatosplenomegaly  Extremities: no clubbing, cyanosis or edema; 2+ radial, ulnar and brachial pulses bilaterally; 2+ right femoral, posterior tibial and dorsalis pedis pulses; 2+ left femoral, posterior tibial and dorsalis pedis pulses; no subclavian or femoral bruits  Neurological: grossly nonfocal  Skin exam shows a few psoriatic plaques on his elbows knees and around the hairline    EKG: Atrial sensed, ventricular paced with a configuration consistent with RV apical stimulation and absence of CRT   Lipid Panel     Component Value Date/Time   CHOL 135 10/16/2011 0400   TRIG 187* 10/16/2011 0400   HDL 34* 10/16/2011 0400   CHOLHDL 4.0 10/16/2011 0400   VLDL 37 10/16/2011 0400   LDLCALC 64 10/16/2011 0400    BMET    Component Value Date/Time   NA 137 12/27/2013 1044   K 4.0 12/27/2013 1044   CL 103 12/27/2013 1044   CO2 25 12/27/2013 1044   GLUCOSE 172* 12/27/2013 1044   BUN 14 12/27/2013 1044   CREATININE 0.7 12/27/2013 1044   CALCIUM 9.5 12/27/2013 1044   GFRNONAA >90 02/11/2012 0500   GFRAA >90 02/11/2012 0500     ASSESSMENT AND PLAN  Unfortunately, it appears that the left ventricular lead has again migrated and now is nonfunctional. This has been associated  with loss of the initial improvement in heart failure symptomatology. There is no pacing capture of the left ventricular lead in any of the available configurations. We'll send him for a repeat  PA and lateral chest x-ray. Followup was arranged with Dr. [name] for next Tuesday to discuss lead repositioning. It also appears that he has progressed to high grade AV block or complete heart block and he should be considered pacemaker dependent.  Orders Placed This Encounter  Procedures  . DG Chest 2 View   No orders of the defined types were placed in this encounter.    Mattew Chriswell  Thurmon Fair, MD, [facility] [phone] office [phone] pager

## 2014-02-19 ENCOUNTER — Ambulatory Visit
Admission: RE | Admit: 2014-02-19 | Discharge: 2014-02-19 | Disposition: A | Discharge status: Home / Self Care | Payer: Medicare Other | Source: Ambulatory Visit | Attending: Cardiovascular Disease | Admitting: Cardiovascular Disease

## 2014-02-19 DIAGNOSIS — T82110A Breakdown (mechanical) of cardiac electrode, initial encounter: Secondary | ICD-10-CM

## 2014-02-19 LAB — MDC_IDC_ENUM_SESS_TYPE_INCLINIC
Battery Remaining Longevity: 68 mo
Battery Voltage: 3.03 V
Brady Statistic AP VP Percent: 3.11 %
Brady Statistic AP VS Percent: 0 %
Brady Statistic AS VP Percent: 96.45 %
Brady Statistic AS VS Percent: 0.45 %
Date Time Interrogation Session: 20150425014229
Lead Channel Impedance Value: 361 Ohm
Lead Channel Impedance Value: 456 Ohm
Lead Channel Impedance Value: 456 Ohm
Lead Channel Impedance Value: 532 Ohm
Lead Channel Impedance Value: 532 Ohm
Lead Channel Impedance Value: 570 Ohm
Lead Channel Pacing Threshold Amplitude: 0.75 V
Lead Channel Pacing Threshold Pulse Width: 0.4 ms
Lead Channel Sensing Intrinsic Amplitude: 2.625 mV
Lead Channel Sensing Intrinsic Amplitude: 3.375 mV
Lead Channel Setting Pacing Amplitude: 2.75 V
Lead Channel Setting Pacing Pulse Width: 0.4 ms
Lead Channel Setting Sensing Sensitivity: 4 mV
MDC IDC MSMT LEADCHNL LV IMPEDANCE VALUE: 247 Ohm
MDC IDC MSMT LEADCHNL RA IMPEDANCE VALUE: 513 Ohm
MDC IDC MSMT LEADCHNL RA PACING THRESHOLD AMPLITUDE: 0.5 V
MDC IDC MSMT LEADCHNL RV IMPEDANCE VALUE: 475 Ohm
MDC IDC MSMT LEADCHNL RV PACING THRESHOLD PULSEWIDTH: 0.4 ms
MDC IDC SET LEADCHNL LV PACING PULSEWIDTH: 0.8 ms
MDC IDC SET LEADCHNL RA PACING AMPLITUDE: 1.5 V
MDC IDC SET LEADCHNL RV PACING AMPLITUDE: 3.5 V
MDC IDC SET ZONE DETECTION INTERVAL: 350 ms
MDC IDC STAT BRADY RA PERCENT PACED: 3.11 %
MDC IDC STAT BRADY RV PERCENT PACED: 99.55 %
Zone Setting Detection Interval: 400 ms

## 2014-02-20 ENCOUNTER — Encounter: Payer: Self-pay | Admitting: Internal Medicine

## 2014-02-20 ENCOUNTER — Ambulatory Visit (INDEPENDENT_AMBULATORY_CARE_PROVIDER_SITE_OTHER): Payer: Medicare Other | Admitting: Internal Medicine

## 2014-02-20 VITALS — BP 136/75 | HR 65 | Ht 70.0 in | Wt 168.0 lb

## 2014-02-20 DIAGNOSIS — T82110A Breakdown (mechanical) of cardiac electrode, initial encounter: Secondary | ICD-10-CM

## 2014-02-20 DIAGNOSIS — T82190A Other mechanical complication of cardiac electrode, initial encounter: Secondary | ICD-10-CM

## 2014-02-20 DIAGNOSIS — I5022 Chronic systolic (congestive) heart failure: Secondary | ICD-10-CM

## 2014-02-20 NOTE — Assessment & Plan Note (Signed)
His heart failure symptoms are worsened with his lead dislodgement. He is encouraged to reduce his sodium intake.

## 2014-02-20 NOTE — Patient Instructions (Signed)
Will call you about plan for lead on your device

## 2014-02-20 NOTE — Progress Notes (Signed)
    HPI Mr. Cowles returns for followup. He is a 68 yo man with a h/o CAD, complete heart block and chronic systolic heart failure. He underwent BiV PPM upgrade several weeks ago and had a difficult case, with diaphragmatic stimulation. His LV lead has dislodged into the right atrium and he has non-capture of his LV. He feels much worse since his dislodgement. His heart failure has gone from class 2A to Class 3B. He denies medical or dietary non-compliance. Allergies  Allergen Reactions  . Pollen Extract      Current Outpatient Prescriptions  Medication Sig Dispense Refill  . albuterol-ipratropium (COMBIVENT) 18-103 MCG/ACT inhaler Inhale 2 puffs into the lungs every 4 (four) hours as needed for wheezing or shortness of breath.      . aspirin 81 MG tablet Take 1 tablet (81 mg total) by mouth every morning. Hold for 2 days. Resume Sat 01/06/2014.  30 tablet    . atorvastatin (LIPITOR) 40 MG tablet Take 40 mg by mouth at bedtime.       . clopidogrel (PLAVIX) 75 MG tablet Take 1 tablet (75 mg total) by mouth every morning. Hold for 2 days. Resume Sat 01/06/2014.      . glipiZIDE (GLUCOTROL XL) 5 MG 24 hr tablet Take 5 mg by mouth daily.      . HYDROcodone-acetaminophen (NORCO/VICODIN) 5-325 MG per tablet Take 1 tablet by mouth as needed for moderate pain.       . isosorbide mononitrate (IMDUR) 30 MG 24 hr tablet Take 30 mg by mouth daily.        . metFORMIN (GLUCOPHAGE) 1000 MG tablet Take 1 tablet (1,000 mg total) by mouth 2 (two) times daily with a meal. Hold for 2 days. Restart on Sat, 01/06/2014.      . metoprolol (LOPRESSOR) 100 MG tablet Take 100 mg by mouth 2 (two) times daily.      . nitroGLYCERIN (NITROSTAT) 0.4 MG SL tablet Place 0.4 mg under the tongue every 5 (five) minutes as needed. For chest pain      . pantoprazole (PROTONIX) 40 MG tablet Take 40 mg by mouth daily as needed.       . benazepril (LOTENSIN) 5 MG tablet Take 5 mg by mouth daily. PT WAS TOLD TO HOLD HOLD THIS  MEDICATION THIS PAST WEEK DUE TO BP BEING LOW (02/20/14)       No current facility-administered medications for this visit.     Past Medical History  Diagnosis Date  . Coronary artery disease   . Psoriasis   . Hypertension   . COPD (chronic obstructive pulmonary disease)   . CHB (complete heart block) 02/11/12    Medtronic permanent pacemaker  . DOE (dyspnea on exertion)     NYHA class 2-3  . Atrial tachycardia   . Cardiomyopathy     mixed ischemic and nonischemic  . Dyslipidemia   . LBBB (left bundle branch block)   . CHF (congestive heart failure)   . Myocardial infarction     "I've had 6" (01/03/2014)  . DVT (deep venous thrombosis) 12/2005    RLE "had to have OR"  . Sleep apnea     c pap at home, does not use  . Type II diabetes mellitus   . GERD (gastroesophageal reflux disease)     ROS:   All systems reviewed and negative except as noted in the HPI.   Past Surgical History  Procedure Laterality Date  . Coronary artery bypass graft    2007    x 3  . Cardiac catheterization  02/10/12    left CX: dominant w/40% prox. AV groove stenosis.The first & second marginal branches had stents visualized near the ostium of the vessel bu were functionally occluded. The remainder of the dominant CX had minor irregularities.  RCA: nondominant free of sign. disease. SVG to OM1 & OM2 sequentially was widely patent.  . US echocardiography  01/10/10    LV systolic fx mod to severely reduced, EF 35-40%, AOV mildly sclerotic  . Nm myocar perf wall motion  10/02/2009    no significant ischemia  . Pacemaker insertion  2013; 01/03/2014    MDT dual chamber pacemaker implanted 2013 for complete heart block; upgrade to CRTP (MDT) by Dr Ladona Ridgel 12/2013 due to cardiomyopathy and heart failure  . Coronary angioplasty with stent placement      "I had 9 stents before the OHS in 2007" (01/03/2014)  . Cataract extraction Left   . Refractive surgery Right   . Electrophysiology study  01/03/2014    EPS by Dr  Ladona Ridgel with no inducible ventricular arrhythmias     Family History  Problem Relation Age of Onset  . Cancer Father      History   Social History  . Marital Status: Married    Spouse Name: N/A    Number of Children: N/A  . Years of Education: N/A   Occupational History  . Not on file.   Social History Main Topics  . Smoking status: Former Smoker -- 36 years    Types: Cigars    Quit date: 05/25/2005  . Smokeless tobacco: Never Used  . Alcohol Use: Yes     Comment: "quit alcohol in 1985 or 1986"  . Drug Use: No  . Sexual Activity: Not Currently   Other Topics Concern  . Not on file   Social History Narrative  . No narrative on file     BP 136/75  Pulse 65  Ht 5\' 10"  (1.778 m)  Wt 168 lb (76.204 kg)  BMI 24.11 kg/m2  Physical Exam:  stable appearing 68 yo man, NAD HEENT: Unremarkable Neck:  No JVD, no thyromegally Back:  No CVA tenderness Lungs:  Clear with no wheezes. Incision is well healed. HEART:  Regular rate rhythm, no murmurs, no rubs, no clicks Abd:  soft, positive bowel sounds, no organomegally, no rebound, no guarding Ext:  2 plus pulses, no edema, no cyanosis, no clubbing Skin:  No rashes no nodules Neuro:  CN II through XII intact, motor grossly intact  Assess/Plan:

## 2014-02-20 NOTE — Assessment & Plan Note (Signed)
I have discussed the treatment options with the patient and there are two. One would be to try and replace his lead with a quadrapolar lead which would also require placement of a St. Juded device. The second option would by to place an epicardial lead via a thoracotomy. I will review his venography and come up with a decision. The patient is almost adamant about not wanting a thoracotomy.

## 2014-02-23 ENCOUNTER — Telehealth: Payer: Self-pay | Admitting: Internal Medicine

## 2014-02-23 DIAGNOSIS — T82110A Breakdown (mechanical) of cardiac electrode, initial encounter: Secondary | ICD-10-CM

## 2014-02-23 DIAGNOSIS — I509 Heart failure, unspecified: Secondary | ICD-10-CM

## 2014-02-23 NOTE — Telephone Encounter (Signed)
New message           Pt is calling back to see if dr taylor has looked at his film concerning his vein to get a bigger device put in. Please give pt a call

## 2014-02-23 NOTE — Telephone Encounter (Signed)
Spoke with patient and let him know we would try for 03/06/14  Fisrt case  Be at the hospital at 5:30am and have labs done 02/27/14  He will check with his wife and let me know if there is a problem

## 2014-02-26 ENCOUNTER — Encounter: Payer: Self-pay | Admitting: *Deleted

## 2014-02-26 ENCOUNTER — Other Ambulatory Visit: Payer: Self-pay | Admitting: *Deleted

## 2014-02-26 DIAGNOSIS — T82110A Breakdown (mechanical) of cardiac electrode, initial encounter: Secondary | ICD-10-CM

## 2014-02-26 DIAGNOSIS — I509 Heart failure, unspecified: Secondary | ICD-10-CM

## 2014-02-27 ENCOUNTER — Encounter (HOSPITAL_COMMUNITY): Payer: Self-pay | Admitting: Pharmacy Technician

## 2014-02-27 ENCOUNTER — Other Ambulatory Visit (INDEPENDENT_AMBULATORY_CARE_PROVIDER_SITE_OTHER): Payer: Medicare Other

## 2014-02-27 DIAGNOSIS — T82198A Other mechanical complication of other cardiac electronic device, initial encounter: Secondary | ICD-10-CM

## 2014-02-27 DIAGNOSIS — I509 Heart failure, unspecified: Secondary | ICD-10-CM

## 2014-02-27 DIAGNOSIS — T82110A Breakdown (mechanical) of cardiac electrode, initial encounter: Secondary | ICD-10-CM

## 2014-02-27 LAB — BASIC METABOLIC PANEL
BUN: 15 mg/dL (ref 6–23)
CO2: 28 mEq/L (ref 19–32)
Calcium: 9.3 mg/dL (ref 8.4–10.5)
Chloride: 100 mEq/L (ref 96–112)
Creatinine, Ser: 0.7 mg/dL (ref 0.4–1.5)
GFR: 119.55 mL/min (ref >60.00)
Glucose, Bld: 227 mg/dL — ABNORMAL HIGH (ref 70–99)
Potassium: 4.4 mEq/L (ref 3.5–5.1)
Sodium: 136 mEq/L (ref 135–145)

## 2014-02-27 LAB — CBC WITH DIFFERENTIAL/PLATELET
Basophils Absolute: 0 10*3/uL (ref 0.0–0.1)
Basophils Relative: 0.4 % (ref 0.0–3.0)
EOS PCT: 1.3 % (ref 0.0–5.0)
Eosinophils Absolute: 0.1 10*3/uL (ref 0.0–0.7)
HCT: 44 % (ref 39.0–52.0)
Hemoglobin: 14.9 g/dL (ref 13.0–17.0)
Lymphocytes Relative: 18.1 % (ref 12.0–46.0)
Lymphs Abs: 1.6 10*3/uL (ref 0.7–4.0)
MCHC: 33.8 g/dL (ref 30.0–36.0)
MCV: 96.1 fl (ref 78.0–100.0)
MONO ABS: 0.6 10*3/uL (ref 0.1–1.0)
Monocytes Relative: 6.8 % (ref 3.0–12.0)
NEUTROS ABS: 6.6 10*3/uL (ref 1.4–7.7)
Neutrophils Relative %: 73.4 % (ref 43.0–77.0)
Platelets: 312 10*3/uL (ref 150.0–400.0)
RBC: 4.58 Mil/uL (ref 4.22–5.81)
RDW: 13.1 % (ref 11.5–15.5)
WBC: 9 10*3/uL (ref 4.0–10.5)

## 2014-03-02 ENCOUNTER — Other Ambulatory Visit: Payer: Self-pay | Admitting: Cardiovascular Disease

## 2014-03-02 NOTE — Telephone Encounter (Signed)
Rx refill sent to patient pharmacy   

## 2014-03-05 ENCOUNTER — Telehealth: Payer: Self-pay

## 2014-03-05 MED ORDER — CEFAZOLIN SODIUM-DEXTROSE 2-3 GM-% IV SOLR
2.0000 g | INTRAVENOUS | Status: DC
  Filled 2014-03-05: qty 50

## 2014-03-05 MED ORDER — SODIUM CHLORIDE 0.9 % IR SOLN
80.0000 mg | Status: DC
  Filled 2014-03-05: qty 2

## 2014-03-05 MED ORDER — SODIUM CHLORIDE 0.9 % IV SOLN
INTRAVENOUS | Status: DC
  Administered 2014-03-06: 06:00:00 via INTRAVENOUS

## 2014-03-05 MED ORDER — CHLORHEXIDINE GLUCONATE 4 % EX LIQD
60.0000 mL | Freq: Once | CUTANEOUS | Status: DC
  Filled 2014-03-05: qty 60

## 2014-03-05 NOTE — Telephone Encounter (Signed)
Refill request for Metformin denied and deferred to pharmacy.

## 2014-03-06 ENCOUNTER — Encounter (HOSPITAL_COMMUNITY): Payer: Self-pay | Admitting: *Deleted

## 2014-03-06 ENCOUNTER — Ambulatory Visit (HOSPITAL_COMMUNITY)
Admission: RE | Admit: 2014-03-06 | Discharge: 2014-03-07 | Disposition: A | Discharge status: Home / Self Care | Payer: Medicare Other | Source: Ambulatory Visit | Attending: Internal Medicine | Admitting: Internal Medicine

## 2014-03-06 ENCOUNTER — Encounter (HOSPITAL_COMMUNITY)
Admission: RE | Disposition: A | Discharge status: Home / Self Care | Payer: Self-pay | Source: Ambulatory Visit | Attending: Internal Medicine

## 2014-03-06 DIAGNOSIS — Z7982 Long term (current) use of aspirin: Secondary | ICD-10-CM | POA: Insufficient documentation

## 2014-03-06 DIAGNOSIS — Z950 Presence of cardiac pacemaker: Secondary | ICD-10-CM | POA: Insufficient documentation

## 2014-03-06 DIAGNOSIS — I509 Heart failure, unspecified: Secondary | ICD-10-CM | POA: Insufficient documentation

## 2014-03-06 DIAGNOSIS — I428 Other cardiomyopathies: Secondary | ICD-10-CM | POA: Insufficient documentation

## 2014-03-06 DIAGNOSIS — I10 Essential (primary) hypertension: Secondary | ICD-10-CM | POA: Insufficient documentation

## 2014-03-06 DIAGNOSIS — E785 Hyperlipidemia, unspecified: Secondary | ICD-10-CM | POA: Insufficient documentation

## 2014-03-06 DIAGNOSIS — Z9861 Coronary angioplasty status: Secondary | ICD-10-CM | POA: Insufficient documentation

## 2014-03-06 DIAGNOSIS — Y831 Surgical operation with implant of artificial internal device as the cause of abnormal reaction of the patient, or of later complication, without mention of misadventure at the time of the procedure: Secondary | ICD-10-CM | POA: Insufficient documentation

## 2014-03-06 DIAGNOSIS — I252 Old myocardial infarction: Secondary | ICD-10-CM | POA: Insufficient documentation

## 2014-03-06 DIAGNOSIS — G4733 Obstructive sleep apnea (adult) (pediatric): Secondary | ICD-10-CM | POA: Insufficient documentation

## 2014-03-06 DIAGNOSIS — J449 Chronic obstructive pulmonary disease, unspecified: Secondary | ICD-10-CM | POA: Insufficient documentation

## 2014-03-06 DIAGNOSIS — K219 Gastro-esophageal reflux disease without esophagitis: Secondary | ICD-10-CM | POA: Insufficient documentation

## 2014-03-06 DIAGNOSIS — I2510 Atherosclerotic heart disease of native coronary artery without angina pectoris: Secondary | ICD-10-CM | POA: Insufficient documentation

## 2014-03-06 DIAGNOSIS — I5022 Chronic systolic (congestive) heart failure: Secondary | ICD-10-CM | POA: Insufficient documentation

## 2014-03-06 DIAGNOSIS — I442 Atrioventricular block, complete: Secondary | ICD-10-CM | POA: Insufficient documentation

## 2014-03-06 DIAGNOSIS — Z7902 Long term (current) use of antithrombotics/antiplatelets: Secondary | ICD-10-CM | POA: Insufficient documentation

## 2014-03-06 DIAGNOSIS — Z951 Presence of aortocoronary bypass graft: Secondary | ICD-10-CM | POA: Insufficient documentation

## 2014-03-06 DIAGNOSIS — L408 Other psoriasis: Secondary | ICD-10-CM | POA: Insufficient documentation

## 2014-03-06 DIAGNOSIS — Z87891 Personal history of nicotine dependence: Secondary | ICD-10-CM | POA: Insufficient documentation

## 2014-03-06 DIAGNOSIS — I447 Left bundle-branch block, unspecified: Secondary | ICD-10-CM | POA: Insufficient documentation

## 2014-03-06 DIAGNOSIS — Z86718 Personal history of other venous thrombosis and embolism: Secondary | ICD-10-CM | POA: Insufficient documentation

## 2014-03-06 DIAGNOSIS — X58XXXA Exposure to other specified factors, initial encounter: Secondary | ICD-10-CM | POA: Insufficient documentation

## 2014-03-06 DIAGNOSIS — T82190A Other mechanical complication of cardiac electrode, initial encounter: Secondary | ICD-10-CM

## 2014-03-06 DIAGNOSIS — E119 Type 2 diabetes mellitus without complications: Secondary | ICD-10-CM | POA: Insufficient documentation

## 2014-03-06 DIAGNOSIS — T82110A Breakdown (mechanical) of cardiac electrode, initial encounter: Secondary | ICD-10-CM

## 2014-03-06 DIAGNOSIS — J4489 Other specified chronic obstructive pulmonary disease: Secondary | ICD-10-CM | POA: Insufficient documentation

## 2014-03-06 DIAGNOSIS — T82598A Other mechanical complication of other cardiac and vascular devices and implants, initial encounter: Secondary | ICD-10-CM | POA: Diagnosis present

## 2014-03-06 HISTORY — PX: LEAD REVISION: SHX5945

## 2014-03-06 HISTORY — PX: BI-VENTRICULAR PACEMAKER REVISION: SHX5463

## 2014-03-06 LAB — SURGICAL PCR SCREEN
MRSA, PCR: NEGATIVE
STAPHYLOCOCCUS AUREUS: NEGATIVE

## 2014-03-06 LAB — GLUCOSE, CAPILLARY
GLUCOSE-CAPILLARY: 181 mg/dL — AB (ref 70–99)
Glucose-Capillary: 318 mg/dL — ABNORMAL HIGH (ref 70–99)

## 2014-03-06 SURGERY — BI-VENTRICULAR PACEMAKER REVISION (CRT-R)
Anesthesia: LOCAL

## 2014-03-06 MED ORDER — IPRATROPIUM-ALBUTEROL 0.5-2.5 (3) MG/3ML IN SOLN: 3.0000 mL | RESPIRATORY_TRACT | Status: DC | PRN

## 2014-03-06 MED ORDER — LIDOCAINE HCL (PF) 1 % IJ SOLN
INTRAMUSCULAR | Status: AC
  Filled 2014-03-06: qty 60

## 2014-03-06 MED ORDER — ONDANSETRON HCL 4 MG/2ML IJ SOLN: 4.0000 mg | Freq: Four times a day (QID) | INTRAMUSCULAR | Status: DC | PRN

## 2014-03-06 MED ORDER — MUPIROCIN 2 % EX OINT
TOPICAL_OINTMENT | CUTANEOUS | Status: AC
  Filled 2014-03-06: qty 22

## 2014-03-06 MED ORDER — MIDAZOLAM HCL 5 MG/5ML IJ SOLN
INTRAMUSCULAR | Status: AC
  Filled 2014-03-06: qty 5

## 2014-03-06 MED ORDER — IPRATROPIUM-ALBUTEROL 18-103 MCG/ACT IN AERO: 2.0000 | INHALATION_SPRAY | RESPIRATORY_TRACT | Status: DC | PRN

## 2014-03-06 MED ORDER — FENTANYL CITRATE 0.05 MG/ML IJ SOLN
INTRAMUSCULAR | Status: AC
  Filled 2014-03-06: qty 2

## 2014-03-06 MED ORDER — HEPARIN (PORCINE) IN NACL 2-0.9 UNIT/ML-% IJ SOLN
INTRAMUSCULAR | Status: AC
  Filled 2014-03-06: qty 500

## 2014-03-06 MED ORDER — ATORVASTATIN CALCIUM 40 MG PO TABS
40.0000 mg | ORAL_TABLET | Freq: Every day | ORAL | Status: DC
  Administered 2014-03-06: 40 mg via ORAL
  Filled 2014-03-06 (×2): qty 1

## 2014-03-06 MED ORDER — CEFAZOLIN SODIUM-DEXTROSE 2-3 GM-% IV SOLR
2.0000 g | Freq: Four times a day (QID) | INTRAVENOUS | Status: AC
Start: 2014-03-06 — End: 2014-03-07
  Administered 2014-03-06 – 2014-03-07 (×3): 2 g via INTRAVENOUS
  Filled 2014-03-06 (×3): qty 50

## 2014-03-06 MED ORDER — PANTOPRAZOLE SODIUM 40 MG PO TBEC: 40.0000 mg | DELAYED_RELEASE_TABLET | Freq: Every day | ORAL | Status: DC | PRN

## 2014-03-06 MED ORDER — MUPIROCIN 2 % EX OINT
TOPICAL_OINTMENT | Freq: Two times a day (BID) | CUTANEOUS | Status: DC
  Administered 2014-03-06: 1 via NASAL
  Administered 2014-03-06 – 2014-03-07 (×2): via NASAL
  Filled 2014-03-06 (×2): qty 22

## 2014-03-06 MED ORDER — ASPIRIN EC 81 MG PO TBEC
81.0000 mg | DELAYED_RELEASE_TABLET | Freq: Every day | ORAL | Status: DC
  Administered 2014-03-07: 81 mg via ORAL
  Filled 2014-03-06 (×2): qty 1

## 2014-03-06 MED ORDER — METOPROLOL TARTRATE 25 MG PO TABS
25.0000 mg | ORAL_TABLET | Freq: Two times a day (BID) | ORAL | Status: DC
  Administered 2014-03-06 – 2014-03-07 (×2): 25 mg via ORAL
  Filled 2014-03-06 (×4): qty 1

## 2014-03-06 MED ORDER — GLIPIZIDE ER 5 MG PO TB24
5.0000 mg | ORAL_TABLET | Freq: Every day | ORAL | Status: DC
  Administered 2014-03-07: 5 mg via ORAL
  Filled 2014-03-06 (×2): qty 1

## 2014-03-06 MED ORDER — ACETAMINOPHEN 325 MG PO TABS: 325.0000 mg | ORAL_TABLET | ORAL | Status: DC | PRN

## 2014-03-06 MED ORDER — NITROGLYCERIN 0.4 MG SL SUBL: 0.4000 mg | SUBLINGUAL_TABLET | SUBLINGUAL | Status: DC | PRN

## 2014-03-06 NOTE — Interval H&P Note (Signed)
History and Physical Interval Note:  03/06/2014 7:22 AM  [name]  has presented today for surgery, with the diagnosis of LV dislodged  The various methods of treatment have been discussed with the patient and family. After consideration of risks, benefits and other options for treatment, the patient has consented to  Procedure(s): BI-VENTRICULAR PACEMAKER REVISION (CRT-R) (N/A) as a surgical intervention .  The patient's history has been reviewed, patient examined, no change in status, stable for surgery.  I have reviewed the patient's chart and labs.  Questions were answered to the patient's satisfaction.     Marinus Maw

## 2014-03-06 NOTE — CV Procedure (Signed)
EP Procedure Note   Procedure: Removal of left ventricular pacing lead which had dislodged and insertion of a new left ventricular pacing lead utilizing coronary sinus venography and pacemaker pocket revision  Description of the procedure: After informed consent was obtained, the patient was taken to the diagnostic electrophysiology laboratory in the fasting state. After the usual preparation and draping, intravenous Versed and fentanyl were utilized for sedation. 30 cc of lidocaine was infiltrated into the left infraclavicular region near the old pacemaker insertion site. A 5 cm incision was carried out. Electrocautery was utilized to dissect down to the pacemaker pocket and the generator was removed with gentle traction. The left ventricular lead was disconnected from the generator and electrocautery utilized to free up the fibrous adhesions. After the sewing sleeve was freed up from its silk suture, and angioplasty 014 guidewire was inserted into the left ventricular lead. The lead was removed with gentle traction. The angioplasty guidewire was allowed to remain in the central circulation. The St. Jude guiding catheter was advanced over the guidewire into the right atrium. A 6 French hexapolar EP catheter was utilized to cannulate the coronary sinus with modest difficulty. The guiding catheter was then advanced into the coronary sinus. Venography of the coronary sinus was carried out. This demonstrated a posterior vein as well as the posterior lateral vein. Previously the posterior lateral vein had been utilized for LV lead placement. The posterior lateral vein was notable for some diaphragmatic stimulation. At this point the [facility]. Jude 1258 bipolar left ventricular pacing lead was inserted over the angioplasty guidewire which had been utilized to recannulate the posterolateral vein. This was a very difficult portion of the procedure. The lead was advanced as for into the vein as possible. Pacing from the  distal bipolar demonstrated diaphragmatic stimulation, but pacing from the proximal ring resulted in no diaphragmatic stimulation. At this point the guiding catheter was liberated from the lead and the lead was secured to the fascial plane with silk suture. The sewing sleeve was secured with silk suture. At this point the pocket was revised for the new lead and to help reduce the risk of pocket infection. The pocket was irrigated with antibiotic irrigation. The new lead was connected to the old biventricular pacemaker. The lead threshold was 1 V at 0.8 ms. The pacing impedance was 600 ohms. 10 V pacing did not sigmoid the diaphragm. The incision was closed with 2 layers of Vicryl suture. Benzoin and Steri-Strips for pain on the skin. The patient was returned to his room in satisfactory condition.  Complications: There were no major connotations  Conclusion: Successful removal of a previously implanted left ventricular lead which had dislodged into the right atrium, and insertion of a new St. Jude left ventricular pacing lead with no immediate procedure complications.  [name], M.D.

## 2014-03-06 NOTE — H&P (View-Only) (Signed)
HPI [name] returns for followup. He is a 68 yo man with a h/o CAD, complete heart block and chronic systolic heart failure. He underwent BiV PPM upgrade several weeks ago and had a difficult case, with diaphragmatic stimulation. His LV lead has dislodged into the right atrium and he has non-capture of his LV. He feels much worse since his dislodgement. His heart failure has gone from class 2A to Class 3B. He denies medical or dietary non-compliance. Allergies  Allergen Reactions  . Pollen Extract      Current Outpatient Prescriptions  Medication Sig Dispense Refill  . albuterol-ipratropium (COMBIVENT) 18-103 MCG/ACT inhaler Inhale 2 puffs into the lungs every 4 (four) hours as needed for wheezing or shortness of breath.      [name]. aspirin 81 MG tablet Take 1 tablet (81 mg total) by mouth every morning. Hold for 2 days. Resume Sat 01/06/2014.  30 tablet    . atorvastatin (LIPITOR) 40 MG tablet Take 40 mg by mouth at bedtime.       . clopidogrel (PLAVIX) 75 MG tablet Take 1 tablet (75 mg total) by mouth every morning. Hold for 2 days. Resume Sat 01/06/2014.      . glipiZIDE (GLUCOTROL XL) 5 MG 24 hr tablet Take 5 mg by mouth daily.      [name]. HYDROcodone-acetaminophen (NORCO/VICODIN) 5-325 MG per tablet Take 1 tablet by mouth as needed for moderate pain.       . isosorbide mononitrate (IMDUR) 30 MG 24 hr tablet Take 30 mg by mouth daily.        . metFORMIN (GLUCOPHAGE) 1000 MG tablet Take 1 tablet (1,000 mg total) by mouth 2 (two) times daily with a meal. Hold for 2 days. Restart on Sat, 01/06/2014.      . metoprolol (LOPRESSOR) 100 MG tablet Take 100 mg by mouth 2 (two) times daily.      . nitroGLYCERIN (NITROSTAT) 0.4 MG SL tablet Place 0.4 mg under the tongue every 5 (five) minutes as needed. For chest pain      . pantoprazole (PROTONIX) 40 MG tablet Take 40 mg by mouth daily as needed.       . benazepril (LOTENSIN) 5 MG tablet Take 5 mg by mouth daily. PT WAS TOLD TO HOLD HOLD THIS  MEDICATION THIS PAST WEEK DUE TO BP BEING LOW (02/20/14)       No current facility-administered medications for this visit.     Past Medical History  Diagnosis Date  . Coronary artery disease   . Psoriasis   . Hypertension   . COPD (chronic obstructive pulmonary disease)   . CHB (complete heart block) 02/11/12    Medtronic permanent pacemaker  . DOE (dyspnea on exertion)     NYHA class 2-3  . Atrial tachycardia   . Cardiomyopathy     mixed ischemic and nonischemic  . Dyslipidemia   . LBBB (left bundle branch block)   . CHF (congestive heart failure)   . Myocardial infarction     "I've had 6" (01/03/2014)  . DVT (deep venous thrombosis) 12/2005    RLE "had to have OR"  . Sleep apnea     c pap at home, does not use  . Type II diabetes mellitus   . GERD (gastroesophageal reflux disease)     ROS:   All systems reviewed and negative except as noted in the HPI.   Past Surgical History  Procedure Laterality Date  . Coronary artery bypass graft  2007    x 3  . Cardiac catheterization  02/10/12    left CX: dominant w/40% prox. AV groove stenosis.The first & second marginal branches had stents visualized near the ostium of the vessel bu were functionally occluded. The remainder of the dominant CX had minor irregularities.  RCA: nondominant free of sign. disease. SVG to OM1 & OM2 sequentially was widely patent.  . US echocardiography  01/10/10    LV systolic fx mod to severely reduced, EF 35-40%, AOV mildly sclerotic  . Nm myocar perf wall motion  10/02/2009    no significant ischemia  . Pacemaker insertion  2013; 01/03/2014    MDT dual chamber pacemaker implanted 2013 for complete heart block; upgrade to CRTP (MDT) by Dr Ladona Ridgel 12/2013 due to cardiomyopathy and heart failure  . Coronary angioplasty with stent placement      "I had 9 stents before the OHS in 2007" (01/03/2014)  . Cataract extraction Left   . Refractive surgery Right   . Electrophysiology study  01/03/2014    EPS by Dr  Ladona Ridgel with no inducible ventricular arrhythmias     Family History  Problem Relation Age of Onset  . Cancer Father      History   Social History  . Marital Status: Married    Spouse Name: N/A    Number of Children: N/A  . Years of Education: N/A   Occupational History  . Not on file.   Social History Main Topics  . Smoking status: Former Smoker -- 36 years    Types: Cigars    Quit date: 05/25/2005  . Smokeless tobacco: Never Used  . Alcohol Use: Yes     Comment: "quit alcohol in 1985 or 1986"  . Drug Use: No  . Sexual Activity: Not Currently   Other Topics Concern  . Not on file   Social History Narrative  . No narrative on file     BP 136/75  Pulse 65  Ht 5\' 10"  (1.778 m)  Wt 168 lb (76.204 kg)  BMI 24.11 kg/m2  Physical Exam:  stable appearing 68 yo man, NAD HEENT: Unremarkable Neck:  No JVD, no thyromegally Back:  No CVA tenderness Lungs:  Clear with no wheezes. Incision is well healed. HEART:  Regular rate rhythm, no murmurs, no rubs, no clicks Abd:  soft, positive bowel sounds, no organomegally, no rebound, no guarding Ext:  2 plus pulses, no edema, no cyanosis, no clubbing Skin:  No rashes no nodules Neuro:  CN II through XII intact, motor grossly intact  Assess/Plan:

## 2014-03-06 NOTE — Discharge Instructions (Signed)
° °  Supplemental Discharge Instructions for  Pacemaker/Defibrillator Patients  Activity No heavy lifting or vigorous activity with your left/right arm for 6 to 8 weeks.  Do not raise your left/right arm above your head for one week.  Gradually raise your affected arm as drawn below.           05/16                      05/17                       05/18                      05/19       NO DRIVING for 1 week; you may begin driving on 33/00/7622. WOUND CARE   Keep the wound area clean and dry.  Do not get this area wet for one week. No showers for one week; you may shower on 03/15/2014.   The tape/steri-strips on your wound will fall off; do not pull them off.  No bandage is needed on the site.  DO NOT apply any creams, oils, or ointments to the wound area.   If you notice any drainage or discharge from the wound, any swelling or bruising at the site, or you develop a fever > 101? F after you are discharged home, call the office at once.  Special Instructions   You are still able to use cellular telephones; use the ear opposite the side where you have your pacemaker/defibrillator.  Avoid carrying your cellular phone near your device.   When traveling through airports, show security personnel your identification card to avoid being screened in the metal detectors.  Ask the security personnel to use the hand wand.   Avoid arc welding equipment, MRI testing (magnetic resonance imaging), TENS units (transcutaneous nerve stimulators).  Call the office for questions about other devices.   Avoid electrical appliances that are in poor condition or are not properly grounded.   Microwave ovens are safe to be near or to operate.

## 2014-03-07 ENCOUNTER — Ambulatory Visit (HOSPITAL_COMMUNITY): Payer: Medicare Other

## 2014-03-07 DIAGNOSIS — Z950 Presence of cardiac pacemaker: Secondary | ICD-10-CM

## 2014-03-07 DIAGNOSIS — I442 Atrioventricular block, complete: Secondary | ICD-10-CM

## 2014-03-07 LAB — GLUCOSE, CAPILLARY
GLUCOSE-CAPILLARY: 181 mg/dL — AB (ref 70–99)
Glucose-Capillary: 221 mg/dL — ABNORMAL HIGH (ref 70–99)

## 2014-03-07 MED ORDER — METFORMIN HCL 1000 MG PO TABS: 1000.0000 mg | ORAL_TABLET | Freq: Two times a day (BID) | ORAL | Status: DC

## 2014-03-07 NOTE — Progress Notes (Signed)
Nursing Note Spoke with Rick Duff [facility], stated pt ok for discharge. Discharge instructions reviewed with patient, medication list and AVS given to patient. Will discharge home as ordered. Randall An Donyel Nester RN

## 2014-03-07 NOTE — Progress Notes (Signed)
    Patient: [name] Date of Encounter: 03/07/2014, 7:46 AM Admit date: 03/06/2014     Subjective  Mr. Pribble has no complaints this AM. He denies CP or SOB. He is eager to go home.   Objective  Physical Exam: Vitals: BP 111/69  Pulse 86  Temp(Src) 97 F (36.1 C) (Oral)  Resp 18  Ht 5\' 10"  (1.778 m)  Wt 162 lb 11.2 oz (73.8 kg)  BMI 23.34 kg/m2  SpO2 96% General: Well developed, well appearing 68 year old male in no acute distress. Neck: Supple. JVD not elevated. Lungs: Clear bilaterally to auscultation without wheezes, rales, or rhonchi. Breathing is unlabored. Heart: Regular S1 S2 without murmurs, rubs, or gallops.  Abdomen: Soft, non-distended. Extremities: No clubbing or cyanosis. No edema.  Distal pedal pulses are 2+ and equal bilaterally. Neuro: Alert and oriented X 3. Moves all extremities spontaneously. No focal deficits. Skin: Left upper chest / implant site intact without significant bleeding or hematoma.  Intake/Output:  Intake/Output Summary (Last 24 hours) at 03/07/14 0746 Last data filed at 03/07/14 0620  Gross per 24 hour  Intake    560 ml  Output   1275 ml  Net   -715 ml    Inpatient Medications:  . aspirin EC  81 mg Oral Daily  . atorvastatin  40 mg Oral QHS  . glipiZIDE  5 mg Oral Daily  . metoprolol  25 mg Oral BID  . mupirocin ointment   Nasal BID    Labs:    Component Value Date/Time   WBC 9.0 02/27/2014 1159   RBC 4.58 02/27/2014 1159   HGB 14.9 02/27/2014 1159   HCT 44.0 02/27/2014 1159   PLT 312.0 02/27/2014 1159   MCV 96.1 02/27/2014 1159   MCH 31.9 02/11/2012 0500   MCHC 33.8 02/27/2014 1159   RDW 13.1 02/27/2014 1159   LYMPHSABS 1.6 02/27/2014 1159   MONOABS 0.6 02/27/2014 1159   EOSABS 0.1 02/27/2014 1159   BASOSABS 0.0 02/27/2014 1159      Component Value Date/Time   NA 136 02/27/2014 1159   K 4.4 02/27/2014 1159   CL 100 02/27/2014 1159   CO2 28 02/27/2014 1159   GLUCOSE 227* 02/27/2014 1159   BUN 15 02/27/2014 1159   CREATININE 0.7 02/27/2014 1159   CALCIUM 9.3 02/27/2014 1159   GFRNONAA >90 02/11/2012 0500   GFRAA >90 02/11/2012 0500    Radiology/Studies: Chest x-ray pending this AM Device interrogation - normal LV pacing thresholds.  Telemetry: A sensed V paced   Assessment and Plan  LV lead dislodgement s/p LV lead revision Complete heart block s/p BiV PPM upgrade  Mixed ischemic / nonischemic cardiomyopathy Chronic systolic HF COPD HTN  Mr .Townes is doing well post LV lead revision(his dislodged lead was removed and a new different lead inserted). Chest x-ray is pending. If within normal range, plan for DC home this AM. Reviewed DC instructions, wound care and follow-up.   Signed, Minda Meo PA-C  EP Attending  Patient seen and examined. Agree with above history, physical exam, assessment and plan. Await CXR.  Leonia Reeves.D.

## 2014-03-07 NOTE — Progress Notes (Signed)
Chest x-ray reviewed by Dr. Ladona Ridgel. Stable lead placement. Patient stable for discharge today.

## 2014-03-07 NOTE — Progress Notes (Signed)
Inpatient Diabetes Program Recommendations  AACE/ADA: New Consensus Statement on Inpatient Glycemic Control (2013)  Target Ranges:  Prepandial:   less than 140 mg/dL      Peak postprandial:   less than 180 mg/dL (1-2 hours)      Critically ill patients:  140 - 180 mg/dL   Inpatient Diabetes Program Recommendations Correction (SSI): Pleae check cbg's tidwc and HS. Please use moderate scale correction. Insulin - Meal Coverage: Please change diet to Heart/Carb modified (combination order for diet listed) HgbA1C: A1C is high at 10.4 % Will try to see patient today to assess need for follow-up. May need OP education as well at [facility]  Thank you, Lenor Coffin, RN, CNS, Diabetes Coordinator [phone])

## 2014-03-07 NOTE — Discharge Summary (Signed)
ELECTROPHYSIOLOGY DISCHARGE SUMMARY   Patient ID: [name],  MRN: [id number], DOB/AGE: [birth year] 68 y.o.  Admit date: 03/06/2014 Discharge date: 03/07/2014  Primary Care Physician: Abigail Miyamoto, MD Primary EP: Ladona Ridgel, MD  Primary Discharge Diagnosis:  LV lead dislodgement s/p LV lead revision  Secondary Discharge Diagnoses:  Complete heart block s/p BiV PPM upgrade  Mixed ischemic / nonischemic cardiomyopathy  Chronic systolic HF  COPD  Hypertension Diabetes mellitus  OSA  Procedures This Admission:  Removal of left ventricular pacing lead which had dislodged and insertion of a new left ventricular pacing lead utilizing coronary sinus venography and pacemaker pocket revision  History and Hospital Course:  [name] is a 68 yo man with CAD, complete heart block and chronic systolic heart failure. He underwent BiV PPM upgrade several weeks ago and had a difficult case, with diaphragmatic stimulation. His LV lead has dislodged into the right atrium and he has non-capture of his LV. He feels much worse since his dislodgement. His heart failure has gone from class 2A to Class 3B. He denies medical or dietary non-compliance. He presented yesterday and underwent LV lead revision. [name] tolerated this procedure well without any immediate complication. He remains hemodynamically stable and afebrile. His chest xray shows stable lead placement without pneumothorax. His device interrogation shows normal BiV PPM function with stable lead parameters/measurements. His implant site is intact without significant bleeding or hematoma. He has been given discharge instructions including wound care and activity restrictions. He will follow-up in 10 days for wound check. There were no changes made to his medications. He has been seen, examined and deemed stable for discharge today by Dr. [name].  Discharge Vitals: Blood pressure 111/69, pulse 86, temperature 97 F (36.1 C), temperature source Oral,  resp. rate 18, height 5\' 10"  (1.778 m), weight 162 lb 11.2 oz (73.8 kg), SpO2 96.00%.   Labs: Lab Results  Component Value Date   WBC 9.0 02/27/2014   HGB 14.9 02/27/2014   HCT 44.0 02/27/2014   MCV 96.1 02/27/2014   PLT 312.0 02/27/2014      Component Value Date/Time   NA 136 02/27/2014 1159   K 4.4 02/27/2014 1159   CL 100 02/27/2014 1159   CO2 28 02/27/2014 1159   GLUCOSE 227* 02/27/2014 1159   BUN 15 02/27/2014 1159   CREATININE 0.7 02/27/2014 1159   CALCIUM 9.3 02/27/2014 1159   GFRNONAA >90 02/11/2012 0500   GFRAA >90 02/11/2012 0500    Disposition:  The patient is being discharged in stable condition.  Follow-up: Follow-up Information   Follow up with [facility] On 03/16/2014. (At 12:30 PM for wound check)    Specialty:  Cardiology   Contact information:   [address] [place] [address] [phone]      Follow up with [name], MD On 04/18/2014. (At 11:45 AM)    Specialty:  Cardiology   Contact information:   [address] [place] [address] [phone]      Discharge Medications:    Medication List         aspirin EC 81 MG tablet  Take 81 mg by mouth daily.     atorvastatin 40 MG tablet  Commonly known as:  LIPITOR  Take 40 mg by mouth at bedtime.     clopidogrel 75 MG tablet  Commonly known as:  PLAVIX  TAKE ONE TABLET BY MOUTH DAILY.     COMBIVENT 18-103 MCG/ACT inhaler  Generic drug:  albuterol-ipratropium  Inhale 2 puffs into the lungs every 4 (four) hours as needed for wheezing or shortness of breath.     glipiZIDE 5 MG 24 hr tablet  Commonly known as:  GLUCOTROL XL  Take 5 mg by mouth daily.     isosorbide mononitrate 30 MG 24 hr tablet  Commonly known as:  IMDUR  TAKE ONE TABLET BY MOUTH ONCE DAILY.     metFORMIN 1000 MG tablet  Commonly known as:  GLUCOPHAGE  Take 1 tablet (1,000 mg total) by mouth 2 (two) times daily with a meal. HOLD for 2 days. Restart 03/10/2014.     metoprolol 100  MG tablet  Commonly known as:  LOPRESSOR  TAKE ONE TABLET BY MOUTH TWICE DAILY     nitroGLYCERIN 0.4 MG SL tablet  Commonly known as:  NITROSTAT  Place 0.4 mg under the tongue every 5 (five) minutes as needed for chest pain.     pantoprazole 40 MG tablet  Commonly known as:  PROTONIX  Take 40 mg by mouth daily as needed (acid reflux).       Duration of Discharge Encounter: Greater than 30 minutes including physician time.  SignedMinda Meo, Brooke O Edmisten, PA-C 03/07/2014, 8:28 AM   EP Attending  Patient seen and examined. Agree with above. New LV lead is working well and no evidence of dislodgement since his procedure.  Leonia ReevesGregg Poppy Mcafee,M.D.

## 2014-03-07 NOTE — Addendum Note (Signed)
Addended by: Ronnell Guadalajara A on: 03/07/2014 12:25 PM   Modules accepted: Orders

## 2014-03-12 ENCOUNTER — Encounter (HOSPITAL_COMMUNITY): Payer: Self-pay | Admitting: *Deleted

## 2014-03-16 ENCOUNTER — Ambulatory Visit (INDEPENDENT_AMBULATORY_CARE_PROVIDER_SITE_OTHER): Payer: Medicare Other | Admitting: *Deleted

## 2014-03-16 ENCOUNTER — Encounter: Payer: Self-pay | Admitting: Internal Medicine

## 2014-03-16 DIAGNOSIS — I442 Atrioventricular block, complete: Secondary | ICD-10-CM

## 2014-03-16 DIAGNOSIS — I509 Heart failure, unspecified: Secondary | ICD-10-CM

## 2014-03-16 DIAGNOSIS — I5042 Chronic combined systolic (congestive) and diastolic (congestive) heart failure: Secondary | ICD-10-CM

## 2014-03-16 LAB — MDC_IDC_ENUM_SESS_TYPE_INCLINIC
Battery Voltage: 3.03 V
Brady Statistic AP VP Percent: 1.36 %
Brady Statistic AS VP Percent: 98.56 %
Brady Statistic RA Percent Paced: 1.36 %
Brady Statistic RV Percent Paced: 99.92 %
Lead Channel Impedance Value: 437 Ohm
Lead Channel Impedance Value: 437 Ohm
Lead Channel Impedance Value: 513 Ohm
Lead Channel Impedance Value: 893 Ohm
Lead Channel Pacing Threshold Amplitude: 0.5 V
Lead Channel Pacing Threshold Amplitude: 0.75 V
Lead Channel Pacing Threshold Pulse Width: 0.4 ms
Lead Channel Sensing Intrinsic Amplitude: 10.5 mV
Lead Channel Sensing Intrinsic Amplitude: 10.5 mV
Lead Channel Setting Pacing Amplitude: 1.5 V
Lead Channel Setting Pacing Amplitude: 2 V
Lead Channel Setting Pacing Amplitude: 2 V
Lead Channel Setting Pacing Pulse Width: 0.8 ms
MDC IDC MSMT BATTERY REMAINING LONGEVITY: 70 mo
MDC IDC MSMT LEADCHNL LV IMPEDANCE VALUE: 475 Ohm
MDC IDC MSMT LEADCHNL LV IMPEDANCE VALUE: 551 Ohm
MDC IDC MSMT LEADCHNL LV IMPEDANCE VALUE: 608 Ohm
MDC IDC MSMT LEADCHNL LV IMPEDANCE VALUE: 684 Ohm
MDC IDC MSMT LEADCHNL RA PACING THRESHOLD PULSEWIDTH: 0.4 ms
MDC IDC MSMT LEADCHNL RA SENSING INTR AMPL: 3.125 mV
MDC IDC MSMT LEADCHNL RA SENSING INTR AMPL: 3.125 mV
MDC IDC MSMT LEADCHNL RV IMPEDANCE VALUE: 513 Ohm
MDC IDC SESS DTM: 20150522154146
MDC IDC SET LEADCHNL RV PACING PULSEWIDTH: 0.4 ms
MDC IDC SET LEADCHNL RV SENSING SENSITIVITY: 5.6 mV
MDC IDC SET ZONE DETECTION INTERVAL: 400 ms
MDC IDC STAT BRADY AP VS PERCENT: 0 %
MDC IDC STAT BRADY AS VS PERCENT: 0.08 %
Zone Setting Detection Interval: 350 ms

## 2014-03-16 NOTE — Progress Notes (Signed)
Wound check appointment. Steri-strips removed. Removed 1 suture. Wound without redness or edema. Incision edges approximated, wound well healed. Normal device function. Thresholds, sensing, and impedances consistent with implant measurements. Device programmed at chronic lead settings. Histogram distribution appropriate for patient and level of activity. OptiVol up since 03/11/14. No mode switches or high ventricular rates noted. Patient educated about wound care, arm mobility, lifting restrictions. ROV w/ Dr. Ladona Ridgel 04/18/14 for LV lead revision f/u.

## 2014-04-12 ENCOUNTER — Encounter: Payer: Medicare Other | Admitting: Internal Medicine

## 2014-04-18 ENCOUNTER — Encounter: Payer: Self-pay | Admitting: Internal Medicine

## 2014-04-18 ENCOUNTER — Ambulatory Visit (INDEPENDENT_AMBULATORY_CARE_PROVIDER_SITE_OTHER): Payer: Medicare Other | Admitting: Internal Medicine

## 2014-04-18 VITALS — BP 134/85 | HR 72 | Ht 70.0 in | Wt 167.0 lb

## 2014-04-18 DIAGNOSIS — I5042 Chronic combined systolic (congestive) and diastolic (congestive) heart failure: Secondary | ICD-10-CM

## 2014-04-18 DIAGNOSIS — I480 Paroxysmal atrial fibrillation: Secondary | ICD-10-CM

## 2014-04-18 DIAGNOSIS — I4891 Unspecified atrial fibrillation: Secondary | ICD-10-CM

## 2014-04-18 DIAGNOSIS — I5022 Chronic systolic (congestive) heart failure: Secondary | ICD-10-CM

## 2014-04-18 DIAGNOSIS — Z950 Presence of cardiac pacemaker: Secondary | ICD-10-CM

## 2014-04-18 DIAGNOSIS — I447 Left bundle-branch block, unspecified: Secondary | ICD-10-CM

## 2014-04-18 DIAGNOSIS — I509 Heart failure, unspecified: Secondary | ICD-10-CM

## 2014-04-18 DIAGNOSIS — I442 Atrioventricular block, complete: Secondary | ICD-10-CM

## 2014-04-18 MED ORDER — FUROSEMIDE 20 MG PO TABS: 20.0000 mg | ORAL_TABLET | Freq: Every day | ORAL | Status: DC

## 2014-04-18 NOTE — Assessment & Plan Note (Signed)
His Medtronic device is working normally. Will recheck in several months. 

## 2014-04-18 NOTE — Assessment & Plan Note (Signed)
I have recommended he undergo chest Xray in several weeks and that he start lasix. I will see him back in a month.

## 2014-04-18 NOTE — Progress Notes (Signed)
HPI [name] returns today for followup. He is a pleasant 68 yo man with a h/o chronic systolic heart failure, who underwent LV lead revision several weeks ago. He has been stable but has not felt as well. He notes sob. He is not on lasix. He denies chest pain. He has dyspnea with exertion.  Allergies  Allergen Reactions  . Pollen Extract Other (See Comments)    "runny nose"     Current Outpatient Prescriptions  Medication Sig Dispense Refill  . albuterol-ipratropium (COMBIVENT) 18-103 MCG/ACT inhaler Inhale 2 puffs into the lungs every 4 (four) hours as needed for wheezing or shortness of breath.      [name] aspirin EC 81 MG tablet Take 81 mg by mouth daily.      [name] atorvastatin (LIPITOR) 40 MG tablet Take 40 mg by mouth at bedtime.       . clopidogrel (PLAVIX) 75 MG tablet TAKE ONE TABLET BY MOUTH DAILY.  90 tablet  2  . glipiZIDE (GLUCOTROL XL) 5 MG 24 hr tablet Take 5 mg by mouth daily.      . isosorbide mononitrate (IMDUR) 30 MG 24 hr tablet TAKE ONE TABLET BY MOUTH ONCE DAILY.  90 tablet  2  . metFORMIN (GLUCOPHAGE) 1000 MG tablet Take 1 tablet (1,000 mg total) by mouth 2 (two) times daily with a meal. HOLD for 2 days. Restart 03/10/2014.      . metoprolol (LOPRESSOR) 100 MG tablet TAKE ONE TABLET BY MOUTH TWICE DAILY  180 tablet  2  . nitroGLYCERIN (NITROSTAT) 0.4 MG SL tablet Place 0.4 mg under the tongue every 5 (five) minutes as needed for chest pain.      . pantoprazole (PROTONIX) 40 MG tablet Take 40 mg by mouth daily as needed (acid reflux).        No current facility-administered medications for this visit.     Past Medical History  Diagnosis Date  . Coronary artery disease   . Psoriasis   . Hypertension   . COPD (chronic obstructive pulmonary disease)   . CHB (complete heart block) 02/11/12    Medtronic permanent pacemaker  . DOE (dyspnea on exertion)     NYHA class 2-3  . Atrial tachycardia   . Cardiomyopathy     mixed ischemic and nonischemic  . Dyslipidemia    . LBBB (left bundle branch block)   . CHF (congestive heart failure)   . Myocardial infarction     "I've had 6" (01/03/2014)  . DVT (deep venous thrombosis) 12/2005    RLE "had to have OR"  . Sleep apnea     c pap at home, does not use  . Type II diabetes mellitus   . GERD (gastroesophageal reflux disease)     ROS:   All systems reviewed and negative except as noted in the HPI.   Past Surgical History  Procedure Laterality Date  . Coronary artery bypass graft  2007    x 3  . Cardiac catheterization  02/10/12    left CX: dominant w/40% prox. AV groove stenosis.The first & second marginal branches had stents visualized near the ostium of the vessel bu were functionally occluded. The remainder of the dominant CX had minor irregularities.  RCA: nondominant free of sign. disease. SVG to OM1 & OM2 sequentially was widely patent.  . US echocardiography  01/10/10    LV systolic fx mod to severely reduced, EF 35-40%, AOV mildly sclerotic  . Nm myocar perf wall motion  10/02/2009    no significant ischemia  . Pacemaker insertion  2013; 01/03/2014    MDT dual chamber pacemaker implanted 2013 for complete heart block; upgrade to CRTP (MDT) by Dr Ladona Ridgelaylor 12/2013 due to cardiomyopathy and heart failure  . Coronary angioplasty with stent placement      "I had 9 stents before the OHS in 2007" (01/03/2014)  . Cataract extraction Left   . Refractive surgery Right   . Electrophysiology study  01/03/2014    EPS by Dr Ladona Ridgelaylor with no inducible ventricular arrhythmias  . Lead revision  03-06-2014    LV lead revision by Dr Ladona Ridgelaylor     Family History  Problem Relation Age of Onset  . Cancer Father      History   Social History  . Marital Status: Married    Spouse Name: N/A    Number of Children: N/A  . Years of Education: N/A   Occupational History  . Not on file.   Social History Main Topics  . Smoking status: Former Smoker -- 36 years    Types: Cigars    Quit date: 05/25/2005  . Smokeless  tobacco: Never Used  . Alcohol Use: Yes     Comment: "quit alcohol in 1985 or 1986"  . Drug Use: No  . Sexual Activity: Not Currently   Other Topics Concern  . Not on file   Social History Narrative  . No narrative on file     BP 134/85  Pulse 72  Ht 5\' 10"  (1.778 m)  Wt 167 lb (75.751 kg)  BMI 23.96 kg/m2  Physical Exam:  Well appearing 68 yo man, NAD HEENT: Unremarkable Neck:  8 cm JVD, no thyromegally Back:  No CVA tenderness Lungs:  Clear with scattered basilar rales. HEART:  Regular rate rhythm, no murmurs, no rubs, no clicks Abd:  soft, positive bowel sounds, no organomegally, no rebound, no guarding Ext:  2 plus pulses, no edema, no cyanosis, no clubbing Skin:  No rashes no nodules Neuro:  CN II through XII intact, motor grossly intact   DEVICE  Normal device function.  See PaceArt for details.   Assess/Plan:

## 2014-04-18 NOTE — Assessment & Plan Note (Signed)
He still has class 2B-3A symptoms despite a functioning BiV system. I have recommended he start lasix daily. He is resistant but I think he has some volume overload. He denies sodium indiscretion.

## 2014-04-18 NOTE — Patient Instructions (Addendum)
Remote monitoring is used to monitor your pacemaker from home. This monitoring reduces the number of office visits required to check your device to one time per year. It allows [place] to keep an eye on the functioning of your device to ensure it is working properly. You are scheduled for a device check from home on 07-24-2014. You may send your transmission at any time that day. If you have a wireless device, the transmission will be sent automatically. After your physician reviews your transmission, you will receive a postcard with your next transmission date.  Your physician wants you to follow-up in: 11/2014 WITH DR. Ladona Ridgel. You will receive a reminder letter in the mail two months in advance. If you don't receive a letter, please call our office to schedule the follow-up appointment.  START LASIX 20 MG DAILY; RX SENT IN  LAB WORK IN 1 WEEK (BMET)  CHEST X-RAY TO BE DONE IN 4 WEEKS AT [facility]  PER DR. Ladona Ridgel YOU WILL NEED TO FOLLOW UP IN 5-6 WEEKS

## 2014-04-19 LAB — MDC_IDC_ENUM_SESS_TYPE_INCLINIC
Battery Voltage: 3.03 V
Brady Statistic AS VP Percent: 96.76 %
Brady Statistic AS VS Percent: 0.08 %
Brady Statistic RA Percent Paced: 3.16 %
Date Time Interrogation Session: 20150624210433
Lead Channel Impedance Value: 475 Ohm
Lead Channel Impedance Value: 475 Ohm
Lead Channel Impedance Value: 532 Ohm
Lead Channel Impedance Value: 665 Ohm
Lead Channel Impedance Value: 703 Ohm
Lead Channel Pacing Threshold Amplitude: 0.625 V
Lead Channel Pacing Threshold Pulse Width: 0.4 ms
Lead Channel Pacing Threshold Pulse Width: 0.8 ms
Lead Channel Sensing Intrinsic Amplitude: 10.5 mV
Lead Channel Sensing Intrinsic Amplitude: 3.375 mV
Lead Channel Setting Pacing Amplitude: 2 V
Lead Channel Setting Pacing Amplitude: 2 V
Lead Channel Setting Pacing Pulse Width: 0.4 ms
Lead Channel Setting Pacing Pulse Width: 0.8 ms
Lead Channel Setting Sensing Sensitivity: 5.6 mV
MDC IDC MSMT BATTERY REMAINING LONGEVITY: 84 mo
MDC IDC MSMT LEADCHNL LV IMPEDANCE VALUE: 551 Ohm
MDC IDC MSMT LEADCHNL LV IMPEDANCE VALUE: 589 Ohm
MDC IDC MSMT LEADCHNL LV IMPEDANCE VALUE: 969 Ohm
MDC IDC MSMT LEADCHNL LV PACING THRESHOLD AMPLITUDE: 0.75 V
MDC IDC MSMT LEADCHNL RA PACING THRESHOLD AMPLITUDE: 0.5 V
MDC IDC MSMT LEADCHNL RA PACING THRESHOLD PULSEWIDTH: 0.4 ms
MDC IDC MSMT LEADCHNL RA SENSING INTR AMPL: 3.75 mV
MDC IDC MSMT LEADCHNL RV IMPEDANCE VALUE: 551 Ohm
MDC IDC MSMT LEADCHNL RV SENSING INTR AMPL: 10.5 mV
MDC IDC SET LEADCHNL RA PACING AMPLITUDE: 1.5 V
MDC IDC STAT BRADY AP VP PERCENT: 3.16 %
MDC IDC STAT BRADY AP VS PERCENT: 0 %
MDC IDC STAT BRADY RV PERCENT PACED: 99.92 %
Zone Setting Detection Interval: 350 ms
Zone Setting Detection Interval: 400 ms

## 2014-04-25 ENCOUNTER — Other Ambulatory Visit: Payer: Medicare Other

## 2014-05-01 ENCOUNTER — Other Ambulatory Visit (INDEPENDENT_AMBULATORY_CARE_PROVIDER_SITE_OTHER): Payer: Medicare Other

## 2014-05-01 DIAGNOSIS — I5042 Chronic combined systolic (congestive) and diastolic (congestive) heart failure: Secondary | ICD-10-CM

## 2014-05-01 DIAGNOSIS — I509 Heart failure, unspecified: Secondary | ICD-10-CM

## 2014-05-01 LAB — BASIC METABOLIC PANEL
BUN: 21 mg/dL (ref 6–23)
CHLORIDE: 100 meq/L (ref 96–112)
CO2: 29 mEq/L (ref 19–32)
CREATININE: 0.8 mg/dL (ref 0.4–1.5)
Calcium: 10 mg/dL (ref 8.4–10.5)
GFR: 110.34 mL/min (ref >60.00)
Glucose, Bld: 199 mg/dL — ABNORMAL HIGH (ref 70–99)
Potassium: 3.9 mEq/L (ref 3.5–5.1)
Sodium: 137 mEq/L (ref 135–145)

## 2014-05-21 ENCOUNTER — Ambulatory Visit
Admission: RE | Admit: 2014-05-21 | Discharge: 2014-05-21 | Disposition: A | Discharge status: Home / Self Care | Payer: Medicare Other | Source: Ambulatory Visit | Attending: Internal Medicine | Admitting: Internal Medicine

## 2014-05-21 DIAGNOSIS — I5042 Chronic combined systolic (congestive) and diastolic (congestive) heart failure: Secondary | ICD-10-CM

## 2014-05-21 DIAGNOSIS — I442 Atrioventricular block, complete: Secondary | ICD-10-CM

## 2014-05-21 DIAGNOSIS — I480 Paroxysmal atrial fibrillation: Secondary | ICD-10-CM

## 2014-05-31 ENCOUNTER — Encounter: Payer: Self-pay | Admitting: Internal Medicine

## 2014-05-31 ENCOUNTER — Ambulatory Visit (INDEPENDENT_AMBULATORY_CARE_PROVIDER_SITE_OTHER): Payer: Medicare Other | Admitting: Internal Medicine

## 2014-05-31 VITALS — BP 128/77 | HR 74 | Ht 70.0 in | Wt 168.0 lb

## 2014-05-31 DIAGNOSIS — R0602 Shortness of breath: Secondary | ICD-10-CM

## 2014-05-31 DIAGNOSIS — I5022 Chronic systolic (congestive) heart failure: Secondary | ICD-10-CM

## 2014-05-31 DIAGNOSIS — Z950 Presence of cardiac pacemaker: Secondary | ICD-10-CM

## 2014-05-31 DIAGNOSIS — I442 Atrioventricular block, complete: Secondary | ICD-10-CM

## 2014-05-31 LAB — MDC_IDC_ENUM_SESS_TYPE_INCLINIC
Battery Voltage: 3.01 V
Brady Statistic AP VS Percent: 0 %
Brady Statistic RA Percent Paced: 4.3 %
Date Time Interrogation Session: 20150806114210
Lead Channel Impedance Value: 513 Ohm
Lead Channel Impedance Value: 532 Ohm
Lead Channel Impedance Value: 665 Ohm
Lead Channel Pacing Threshold Amplitude: 0.75 V
Lead Channel Pacing Threshold Pulse Width: 0.4 ms
Lead Channel Pacing Threshold Pulse Width: 0.4 ms
Lead Channel Pacing Threshold Pulse Width: 0.8 ms
Lead Channel Setting Pacing Amplitude: 2 V
Lead Channel Setting Pacing Amplitude: 2 V
Lead Channel Setting Pacing Pulse Width: 0.4 ms
Lead Channel Setting Sensing Sensitivity: 5.6 mV
MDC IDC MSMT BATTERY REMAINING LONGEVITY: 72 mo
MDC IDC MSMT LEADCHNL LV IMPEDANCE VALUE: 551 Ohm
MDC IDC MSMT LEADCHNL LV IMPEDANCE VALUE: 589 Ohm
MDC IDC MSMT LEADCHNL LV IMPEDANCE VALUE: 703 Ohm
MDC IDC MSMT LEADCHNL LV IMPEDANCE VALUE: 969 Ohm
MDC IDC MSMT LEADCHNL LV PACING THRESHOLD AMPLITUDE: 0.75 V
MDC IDC MSMT LEADCHNL RA IMPEDANCE VALUE: 551 Ohm
MDC IDC MSMT LEADCHNL RA PACING THRESHOLD AMPLITUDE: 0.5 V
MDC IDC MSMT LEADCHNL RA SENSING INTR AMPL: 3.875 mV
MDC IDC MSMT LEADCHNL RV IMPEDANCE VALUE: 475 Ohm
MDC IDC SET LEADCHNL LV PACING PULSEWIDTH: 0.8 ms
MDC IDC SET LEADCHNL RV PACING AMPLITUDE: 2.5 V
MDC IDC SET ZONE DETECTION INTERVAL: 350 ms
MDC IDC STAT BRADY AP VP PERCENT: 4.3 %
MDC IDC STAT BRADY AS VP PERCENT: 95.58 %
MDC IDC STAT BRADY AS VS PERCENT: 0.11 %
MDC IDC STAT BRADY RV PERCENT PACED: 99.89 %
Zone Setting Detection Interval: 400 ms

## 2014-05-31 NOTE — Patient Instructions (Addendum)
Your physician wants you to follow-up in: 07/2014 with Dr C and March 2016 with Dr Court Joy will receive a reminder letter in the mail two months in advance. If you don't receive a letter, please call our office to schedule the follow-up appointment.  Remote monitoring is used to monitor your Pacemaker or ICD from home. This monitoring reduces the number of office visits required to check your device to one time per year. It allows [place] to keep an eye on the functioning of your device to ensure it is working properly. You are scheduled for a device check from home on 09/03/14. You may send your transmission at any time that day. If you have a wireless device, the transmission will be sent automatically. After your physician reviews your transmission, you will receive a postcard with your next transmission date.   Your physician has recommended that you have a cardiopulmonary stress test (CPX). CPX testing is a non-invasive measurement of heart and lung function. It replaces a traditional treadmill stress test. This type of test provides a tremendous amount of information that relates not only to your present condition but also for future outcomes. This test combines measurements of you ventilation, respiratory gas exchange in the lungs, electrocardiogram (EKG), blood pressure and physical response before, during, and following an exercise protocol.

## 2014-05-31 NOTE — Assessment & Plan Note (Signed)
His biV PPM is working normally. I would suggest we repeat his 2D echo in several weeks.

## 2014-05-31 NOTE — Assessment & Plan Note (Signed)
He states that he still feels worse than he did after his initial LV lead placement. I am unclear as to why he is not better than he is. It is unclear if he is limited by cardiac output or something else. I have asked the patient to undergo CPX testing and followup with Dr. Royann Shivers.

## 2014-05-31 NOTE — Progress Notes (Signed)
HPI [name] returns today for followup. He is a pleasant 68 yo man with a h/o chronic systolic heart failure, who underwent LV lead revision several months ago. He has been stable but continues to feel weak and tired. He notes sob. He was placed on lasix when I saw him several weeks ago. He denies chest pain. He has dyspnea with exertion.  Allergies  Allergen Reactions  . Pollen Extract Other (See Comments)    "runny nose"     Current Outpatient Prescriptions  Medication Sig Dispense Refill  . albuterol-ipratropium (COMBIVENT) 18-103 MCG/ACT inhaler Inhale 2 puffs into the lungs every 4 (four) hours as needed for wheezing or shortness of breath.      [name]. aspirin EC 81 MG tablet Take 81 mg by mouth daily.      [name]. atorvastatin (LIPITOR) 40 MG tablet Take 40 mg by mouth at bedtime.       . clopidogrel (PLAVIX) 75 MG tablet TAKE ONE TABLET BY MOUTH DAILY.  90 tablet  2  . furosemide (LASIX) 20 MG tablet Take 1 tablet (20 mg total) by mouth daily.  30 tablet  11  . glipiZIDE (GLUCOTROL XL) 5 MG 24 hr tablet Take 5 mg by mouth daily.      . isosorbide mononitrate (IMDUR) 30 MG 24 hr tablet TAKE ONE TABLET BY MOUTH ONCE DAILY.  90 tablet  2  . metFORMIN (GLUCOPHAGE) 1000 MG tablet Take 1 tablet (1,000 mg total) by mouth 2 (two) times daily with a meal. HOLD for 2 days. Restart 03/10/2014.      . metoprolol (LOPRESSOR) 100 MG tablet TAKE ONE TABLET BY MOUTH TWICE DAILY  180 tablet  2  . nitroGLYCERIN (NITROSTAT) 0.4 MG SL tablet Place 0.4 mg under the tongue every 5 (five) minutes as needed for chest pain.      . pantoprazole (PROTONIX) 40 MG tablet Take 40 mg by mouth daily as needed (acid reflux).        No current facility-administered medications for this visit.     Past Medical History  Diagnosis Date  . Coronary artery disease   . Psoriasis   . Hypertension   . COPD (chronic obstructive pulmonary disease)   . CHB (complete heart block) 02/11/12    Medtronic permanent pacemaker    . DOE (dyspnea on exertion)     NYHA class 2-3  . Atrial tachycardia   . Cardiomyopathy     mixed ischemic and nonischemic  . Dyslipidemia   . LBBB (left bundle branch block)   . CHF (congestive heart failure)   . Myocardial infarction     "I've had 6" (01/03/2014)  . DVT (deep venous thrombosis) 12/2005    RLE "had to have OR"  . Sleep apnea     c pap at home, does not use  . Type II diabetes mellitus   . GERD (gastroesophageal reflux disease)     ROS:   All systems reviewed and negative except as noted in the HPI.   Past Surgical History  Procedure Laterality Date  . Coronary artery bypass graft  2007    x 3  . Cardiac catheterization  02/10/12    left CX: dominant w/40% prox. AV groove stenosis.The first & second marginal branches had stents visualized near the ostium of the vessel bu were functionally occluded. The remainder of the dominant CX had minor irregularities.  RCA: nondominant free of sign. disease. SVG to OM1 & OM2 sequentially was  widely patent.  . US echocardiography  01/10/10    LV systolic fx mod to severely reduced, EF 35-40%, AOV mildly sclerotic  . Nm myocar perf wall motion  10/02/2009    no significant ischemia  . Pacemaker insertion  2013; 01/03/2014    MDT dual chamber pacemaker implanted 2013 for complete heart block; upgrade to CRTP (MDT) by Dr Ladona Ridgel 12/2013 due to cardiomyopathy and heart failure  . Coronary angioplasty with stent placement      "I had 9 stents before the OHS in 2007" (01/03/2014)  . Cataract extraction Left   . Refractive surgery Right   . Electrophysiology study  01/03/2014    EPS by Dr Ladona Ridgel with no inducible ventricular arrhythmias  . Lead revision  03-06-2014    LV lead revision by Dr Ladona Ridgel     Family History  Problem Relation Age of Onset  . Cancer Father      History   Social History  . Marital Status: Married    Spouse Name: N/A    Number of Children: N/A  . Years of Education: N/A   Occupational History  .  Not on file.   Social History Main Topics  . Smoking status: Former Smoker -- 36 years    Types: Cigars    Quit date: 05/25/2005  . Smokeless tobacco: Never Used  . Alcohol Use: Yes     Comment: "quit alcohol in 1985 or 1986"  . Drug Use: No  . Sexual Activity: Not Currently   Other Topics Concern  . Not on file   Social History Narrative  . No narrative on file     BP 128/77  Pulse 74  Ht 5\' 10"  (1.778 m)  Wt 168 lb (76.204 kg)  BMI 24.11 kg/m2  Physical Exam:  Well appearing 68 yo man, NAD HEENT: Unremarkable Neck:  6 cm JVD, no thyromegally Back:  No CVA tenderness Lungs:  Clear with scattered basilar rales. HEART:  Regular rate rhythm, no murmurs, no rubs, no clicks Abd:  soft, positive bowel sounds, no organomegally, no rebound, no guarding Ext:  2 plus pulses, no edema, no cyanosis, no clubbing Skin:  No rashes no nodules Neuro:  CN II through XII intact, motor grossly intact   DEVICE  Normal device function.  See PaceArt for details.   Assess/Plan:

## 2014-06-04 ENCOUNTER — Telehealth: Payer: Self-pay | Admitting: *Deleted

## 2014-06-04 ENCOUNTER — Ambulatory Visit (HOSPITAL_COMMUNITY): Payer: Medicare Other | Attending: Internal Medicine

## 2014-06-04 DIAGNOSIS — J449 Chronic obstructive pulmonary disease, unspecified: Secondary | ICD-10-CM

## 2014-06-04 DIAGNOSIS — R0989 Other specified symptoms and signs involving the circulatory and respiratory systems: Principal | ICD-10-CM | POA: Insufficient documentation

## 2014-06-04 DIAGNOSIS — R0602 Shortness of breath: Secondary | ICD-10-CM

## 2014-06-04 DIAGNOSIS — I5022 Chronic systolic (congestive) heart failure: Secondary | ICD-10-CM

## 2014-06-04 DIAGNOSIS — R0609 Other forms of dyspnea: Secondary | ICD-10-CM | POA: Diagnosis present

## 2014-06-04 NOTE — Telephone Encounter (Signed)
Please order/schedule echo for dyspnea, CHF MCr ----- Message ----- From: Marinus Maw, MD Sent: 05/31/2014 11:39 PM To: Thurmon Fair, MD Mehi, I saw [name] back and his BiV PPM is working normally. He remains sob. I ordered a cpx and will have him followup with you. He probably should have a repeat 2D echo. Order placed and sent to [facility] to schedule.

## 2014-06-05 ENCOUNTER — Telehealth (HOSPITAL_COMMUNITY): Payer: Self-pay | Admitting: *Deleted

## 2014-06-18 ENCOUNTER — Telehealth (HOSPITAL_COMMUNITY): Payer: Self-pay | Admitting: *Deleted

## 2014-06-18 NOTE — Telephone Encounter (Signed)
This patient has an order in for an echocardiogram. When calling to schedule, the patient wants to know if its necessary to have it done being that he just had a met test done at the hospital.   

## 2014-06-20 ENCOUNTER — Telehealth: Payer: Self-pay | Admitting: *Deleted

## 2014-06-20 DIAGNOSIS — R942 Abnormal results of pulmonary function studies: Secondary | ICD-10-CM

## 2014-06-20 DIAGNOSIS — R0602 Shortness of breath: Secondary | ICD-10-CM

## 2014-06-20 NOTE — Telephone Encounter (Signed)
Message copied by Vita Barley on Wed Jun 20, 2014  4:17 PM ------      Message from: Thurmon Fair      Created: Tue Jun 19, 2014  3:00 PM       Thanks, Sharlot Gowda.      Britta Mccreedy, has Mr. Babcock ever seen a pulmonary specialist? Can we please refer him to one?      MCr ------

## 2014-06-20 NOTE — Telephone Encounter (Signed)
Wants to wait on Echo.  Explained the difference between a MET test and Echo, but he can't afford to have it done at this time. Told him to call when he is able.

## 2014-06-20 NOTE — Telephone Encounter (Signed)
[name] has seen Dr. Fannie Knee in the past and is will to be re-evaluated.  Order placed and told patient Dr. Roxy Cedar office will call. Patient voiced understanding.

## 2014-06-25 ENCOUNTER — Ambulatory Visit: Payer: Medicare Other | Admitting: Internal Medicine

## 2014-06-29 ENCOUNTER — Encounter: Payer: Self-pay | Admitting: Emergency Medicine

## 2014-06-29 ENCOUNTER — Ambulatory Visit (INDEPENDENT_AMBULATORY_CARE_PROVIDER_SITE_OTHER): Payer: Medicare Other | Admitting: Emergency Medicine

## 2014-06-29 VITALS — BP 110/80 | HR 78 | Ht 70.0 in | Wt 169.0 lb

## 2014-06-29 DIAGNOSIS — R0609 Other forms of dyspnea: Secondary | ICD-10-CM

## 2014-06-29 DIAGNOSIS — R0989 Other specified symptoms and signs involving the circulatory and respiratory systems: Principal | ICD-10-CM

## 2014-06-29 MED ORDER — ALBUTEROL SULFATE HFA 108 (90 BASE) MCG/ACT IN AERS: 2.0000 | INHALATION_SPRAY | Freq: Four times a day (QID) | RESPIRATORY_TRACT | Status: DC | PRN

## 2014-06-29 MED ORDER — TIOTROPIUM BROMIDE-OLODATEROL 2.5-2.5 MCG/ACT IN AERS: 2.0000 | INHALATION_SPRAY | Freq: Every day | RESPIRATORY_TRACT | Status: DC

## 2014-06-29 NOTE — Progress Notes (Signed)
Subjective:    Patient ID: [name], male    DOB: Aug 27, 1946, 68 y.o.   MRN: [id number]  HPI 68 yo man, former smoker (cigars, 100 a week x 20 yrs), hx HTN, CAD, 3rd degree block + pacer, CHF, DM, OSA (no CPAP), psoriasis, DVT. He has significant exertional SOB> can happen with any work, including gardening, watering the plants. He occasionally wheezes. He coughs most days, non-productive. He has used combivent that the uses prn > some days 3x a day, some days not at all. He gets choked easily when eating / drinking.   He underwent CPST 8/10 that showed that he had a limited functional capacity and that he reached ventilatory limits. His ventilation was decreased on spirometry. He did not desaturate.    Review of Systems  Constitutional: Negative for fever and unexpected weight change.  HENT: Positive for congestion and voice change. Negative for dental problem, ear pain, nosebleeds, postnasal drip, rhinorrhea, sinus pressure, sneezing, sore throat and trouble swallowing.   Eyes: Negative for redness and itching.  Respiratory: Positive for shortness of breath and wheezing. Negative for cough and chest tightness.   Cardiovascular: Negative for palpitations and leg swelling.  Gastrointestinal: Negative for nausea and vomiting.  Genitourinary: Negative for dysuria.  Musculoskeletal: Negative for joint swelling.  Skin: Negative for rash.  Neurological: Negative for headaches.  Hematological: Does not bruise/bleed easily.  Psychiatric/Behavioral: Negative for dysphoric mood. The patient is not nervous/anxious.    Past Medical History  Diagnosis Date  . Coronary artery disease   . Psoriasis   . Hypertension   . COPD (chronic obstructive pulmonary disease)   . CHB (complete heart block) 02/11/12    Medtronic permanent pacemaker  . DOE (dyspnea on exertion)     NYHA class 2-3  . Atrial tachycardia   . Cardiomyopathy     mixed ischemic and nonischemic  . Dyslipidemia   . LBBB (left  bundle branch block)   . CHF (congestive heart failure)   . Myocardial infarction     "I've had 6" (01/03/2014)  . DVT (deep venous thrombosis) 12/2005    RLE "had to have OR"  . Sleep apnea     c pap at home, does not use  . Type II diabetes mellitus   . GERD (gastroesophageal reflux disease)      Family History  Problem Relation Age of Onset  . Cancer Father      History   Social History  . Marital Status: Married    Spouse Name: N/A    Number of Children: N/A  . Years of Education: N/A   Occupational History  . Not on file.   Social History Main Topics  . Smoking status: Former Smoker -- 15.00 packs/day for 36 years    Types: Cigars, Cigarettes    Quit date: 05/25/2005  . Smokeless tobacco: Never Used     Comment: 15 cigars/daily when smoking  . Alcohol Use: Yes     Comment: "quit alcohol in 1985 or 1986"  . Drug Use: No  . Sexual Activity: Not Currently   Other Topics Concern  . Not on file   Social History Narrative  . No narrative on file     Allergies  Allergen Reactions  . Pollen Extract Other (See Comments)    "runny nose"     Outpatient Prescriptions Prior to Visit  Medication Sig Dispense Refill  . albuterol-ipratropium (COMBIVENT) 18-103 MCG/ACT inhaler Inhale 2 puffs into the lungs every 4 (  four) hours as needed for wheezing or shortness of breath.      [name] aspirin EC 81 MG tablet Take 81 mg by mouth daily.      [name] atorvastatin (LIPITOR) 40 MG tablet Take 40 mg by mouth at bedtime.       . clopidogrel (PLAVIX) 75 MG tablet TAKE ONE TABLET BY MOUTH DAILY.  90 tablet  2  . furosemide (LASIX) 20 MG tablet Take 1 tablet (20 mg total) by mouth daily.  30 tablet  11  . glipiZIDE (GLUCOTROL XL) 5 MG 24 hr tablet Take 5 mg by mouth 2 (two) times daily.       . isosorbide mononitrate (IMDUR) 30 MG 24 hr tablet TAKE ONE TABLET BY MOUTH ONCE DAILY.  90 tablet  2  . metFORMIN (GLUCOPHAGE) 1000 MG tablet Take 1 tablet (1,000 mg total) by mouth 2 (two) times  daily with a meal. HOLD for 2 days. Restart 03/10/2014.      . metoprolol (LOPRESSOR) 100 MG tablet TAKE ONE TABLET BY MOUTH TWICE DAILY  180 tablet  2  . nitroGLYCERIN (NITROSTAT) 0.4 MG SL tablet Place 0.4 mg under the tongue every 5 (five) minutes as needed for chest pain.      . pantoprazole (PROTONIX) 40 MG tablet Take 40 mg by mouth daily as needed (acid reflux).        No facility-administered medications prior to visit.         Objective:   Physical Exam Filed Vitals:   06/29/14 1204  BP: 110/80  Pulse: 78  Height:  (1.778 m)  Weight: 169 lb (76.658 kg)  SpO2: 95%   Gen: Pleasant, well-nourished, in no distress,  normal affect  ENT: No lesions,  mouth dry, dentures, slight erythema  Neck: No JVD, no TMG, no carotid bruits  Lungs: No use of accessory muscles, clear without rales or rhonchi, no wheezes  Cardiovascular: RRR, heart sounds normal, no murmur or gallops, no peripheral edema  Musculoskeletal: No deformities, no cyanosis or clubbing  Neuro: alert, non focal  Skin: Warm, no lesions or rashes     08/30/08 spirometry >> mild AFL (Dr Maple Hudson)  05/21/14 CXR COMPARISON: 03/07/2014  FINDINGS:  Hyperinflation. Prior median sternotomy. Moderate thoracic  spondylosis. Three lead pacer is unchanged in position. Right  atrium, right ventricle, and coronary sinus. No discontinuity.  Midline trachea. Normal heart size. Atherosclerosis in the  transverse aorta. No pleural effusion or pneumothorax. No congestive  failure. Suspect a calcified granuloma projecting over the right  upper lobe. 6 mm.  IMPRESSION:  No acute cardiopulmonary disease   06/04/14 CPST --  Procedure: This patient underwent staged symptom-limited exercise treadmill testing using a modified Naughton protocol with expired gas analysis metabolic evaluation during exercise.  Demographics  Age: 68 Ht. (in.) 70 Wt. (lb) 168 BMI: 24.1 Predicted Peak VO2: 28.6 ml/kg/min  Gender: Male Ht  (cm) 177.8 Wt. (kg) 76.2    Results  Pre-Exercise PFTs   FVC 2.25 (53%)   FEV1 1.39 (41%)   FEV1/FVC 62%   MVV 50 (38%)  Exercise Time: 11:05 Speed (mph): 3.0 Grade (%): 7.5    RPE: 15  Reason stopped: Patient stopped due to dyspnea (7/10)  Additional symptoms: None reported  Resting HR: 63 Peak HR: 126 (82% age predicted max HR)  BP rest: 122/70 BP peak: 190/80  Peak VO2: 20.3 (70.9% predicted peak VO2)  VE/VCO2 slope: 30.3  OUES: 2.32  Peak RER: 1.03  Ventilatory Threshold: 16.5 (57.6%  predicted peak VO2)  Peak RR 37  Peak Ventilation: 53.8  VE/MVV: 108%  PETCO2 at peak: 35  O2pulse: 13 (93% predicted O2pulse)   Interpretation  Notes: Patient gave a good effort. The pulse-oximetry remained between 97-98% throughout the exercise.  ECG: Resting ECG in sinus rhythm with Biventricular pacing. There was an adequate HR response (beta-blockade considered) with continued biventricular pacing throughout the exercise, rare PVC and rare instances of atrial pacing. There was an appropriate BP response.  PFT: Resting spirometry demonstrates a severe obstructive/restrictive pattern. The MVV is severely reduced.  CPX: Exercise testing with gas exchange demonstrates a mildly reduced peak VO2 of 20.3 ml/kg/min (71% of the age/gender/weight matched sedentary norms). The RER of 1.03 indicates a sub-maximal aerobic eexercise. The VE/VCO2 slope is at the upper limit of normal. The oxygen uptake efficiency slope (OUES) is well above normal and suggestive of a higher potential functional capacity (this may be misleading due to the patient's limited ventilatory capacity). The VO2 at the ventilatory threshold was normal at 58% of the predicted peak VO2. At peak exercise, the ventilation reached 108% of the measured MVV indicating ventilatory limits had been reached (respiratory rate was within the expected range, Vt/IC [69%] was within the expected range, PETCO2 was  normal). The O2pulse (a surrogate for stroke volume) increased throughout much of the exercise remaining unchanged during the last 2-3 minutes of exercise at 13 ml/beat (93% of predicted).   Conclusion: Exercise testing with gas exchange demonstrates a mild functional limitation when compared to matched sedentary norms. There is a primary ventilatory limitation to the exercise related to the patient's obstructive lung disease. The flattened O2pulse near peak exercise may indicated circulatory limitations were being approached. There were no desaturations during exercise.    11/30/13 -- TTE Study Conclusions - Left ventricle: The cavity size was normal. There was moderate concentric hypertrophy. Systolic function was moderately to severely reduced. The estimated ejection fraction was in the range of 30% to 35%. Due to first degree atrioventricular block, there was fusion of early and atrial contributions to ventricular filling. Doppler parameters are consistent with elevated mean left atrial filling pressure. - Ventricular septum: Septal motion showed abnormal function, dyssynergy, and paradox. - Mitral valve: Calcified annulus. Valve area by pressure half-time: 2.37cm^2. - Left atrium: The atrium was mildly dilated.       Assessment & Plan:  DYSPNEA ON EXERTION Multifactorial but believe that pulmonary process is driving his dyspnea the most given his CPST results. He has a prior evaluation consistent with obstructive lung disease ('08) and his spirometry for his exercise test showed severe mixed lung disease. Likely largely COPD, but must consider also ILD, secondary PAH although he did not desaturate on ambulation.  - trial changing combivent to stiolto - albuterol prn - full PFT to further assess volumes, DLCO - rov 1

## 2014-06-29 NOTE — Patient Instructions (Addendum)
Please stop Combivent for now We will start Stiolto 2 inhalations daily Use albuterol 2 puffs if needed for shortness of breath Follow up with Dr Delton Coombes in 1 month with full PFT

## 2014-06-29 NOTE — Assessment & Plan Note (Signed)
Multifactorial but believe that pulmonary process is driving his dyspnea the most given his CPST results. He has a prior evaluation consistent with obstructive lung disease ('08) and his spirometry for his exercise test showed severe mixed lung disease. Likely largely COPD, but must consider also ILD, secondary PAH although he did not desaturate on ambulation.  - trial changing combivent to stiolto - albuterol prn - full PFT to further assess volumes, DLCO - rov 1

## 2014-07-18 ENCOUNTER — Telehealth: Payer: Self-pay | Admitting: Emergency Medicine

## 2014-07-18 MED ORDER — ALBUTEROL SULFATE HFA 108 (90 BASE) MCG/ACT IN AERS: 2.0000 | INHALATION_SPRAY | Freq: Four times a day (QID) | RESPIRATORY_TRACT | Status: DC | PRN

## 2014-07-18 NOTE — Telephone Encounter (Signed)
Spoke with patient about change in rescue inhalers and sent new Rx to [facility]-per patient request will send Rx to pharmacy to place on hold until patient fills.

## 2014-08-06 ENCOUNTER — Encounter: Payer: Medicare Other | Admitting: Cardiovascular Disease

## 2014-08-10 ENCOUNTER — Encounter: Payer: Self-pay | Admitting: Emergency Medicine

## 2014-08-10 ENCOUNTER — Ambulatory Visit (INDEPENDENT_AMBULATORY_CARE_PROVIDER_SITE_OTHER): Payer: Medicare Other | Admitting: Emergency Medicine

## 2014-08-10 VITALS — BP 130/82 | HR 72 | Temp 97.7°F | Ht 69.0 in | Wt 173.0 lb

## 2014-08-10 DIAGNOSIS — Z23 Encounter for immunization: Secondary | ICD-10-CM

## 2014-08-10 DIAGNOSIS — J449 Chronic obstructive pulmonary disease, unspecified: Secondary | ICD-10-CM

## 2014-08-10 DIAGNOSIS — R0689 Other abnormalities of breathing: Secondary | ICD-10-CM

## 2014-08-10 DIAGNOSIS — R0600 Dyspnea, unspecified: Secondary | ICD-10-CM

## 2014-08-10 LAB — PULMONARY FUNCTION TEST
DL/VA % PRED: 81 %
DL/VA: 3.68 ml/min/mmHg/L
DLCO unc % pred: 69 %
DLCO unc: 21.42 ml/min/mmHg
FEF 25-75 Post: 1.23 L/sec
FEF 25-75 Pre: 0.67 L/sec
FEF2575-%CHANGE-POST: 84 %
FEF2575-%PRED-POST: 49 %
FEF2575-%PRED-PRE: 26 %
FEV1-%Change-Post: 19 %
FEV1-%PRED-PRE: 48 %
FEV1-%Pred-Post: 58 %
FEV1-POST: 1.88 L
FEV1-Pre: 1.58 L
FEV1FVC-%CHANGE-POST: -1 %
FEV1FVC-%PRED-PRE: 78 %
FEV6-%CHANGE-POST: 20 %
FEV6-%PRED-POST: 76 %
FEV6-%Pred-Pre: 63 %
FEV6-Post: 3.16 L
FEV6-Pre: 2.62 L
FEV6FVC-%Change-Post: 0 %
FEV6FVC-%Pred-Post: 102 %
FEV6FVC-%Pred-Pre: 103 %
FVC-%Change-Post: 20 %
FVC-%Pred-Post: 75 %
FVC-%Pred-Pre: 62 %
FVC-POST: 3.27 L
FVC-Pre: 2.7 L
POST FEV1/FVC RATIO: 58 %
Post FEV6/FVC ratio: 97 %
Pre FEV1/FVC ratio: 58 %
Pre FEV6/FVC Ratio: 97 %
RV % pred: 175 %
RV: 4.11 L
TLC % PRED: 102 %
TLC: 7 L

## 2014-08-10 NOTE — Progress Notes (Signed)
PFT done today. 

## 2014-08-10 NOTE — Patient Instructions (Addendum)
We will go back to combivent 4 times a day Use albuterol 2 puffs if needed for shortness of breath Walking oximetry today Follow with Dr Delton Coombes in 6 months or sooner if you have any problems

## 2014-08-10 NOTE — Progress Notes (Addendum)
Subjective:    Patient ID: [name], male    DOB: [birth year], 68 y.o.   MRN: [id number]  HPI 68 yo man, former smoker (cigars, 100 a week x 20 yrs), hx HTN, CAD, 3rd degree block + pacer, CHF, DM, OSA (no CPAP), psoriasis, DVT. He has significant exertional SOB> can happen with any work, including gardening, watering the plants. He occasionally wheezes. He coughs most days, non-productive. He has used combivent that the uses prn > some days 3x a day, some days not at all. He gets choked easily when eating / drinking.   He underwent CPST 8/10 that showed that he had a limited functional capacity and that he reached ventilatory limits. His ventilation was decreased on spirometry. He did not desaturate.  ROV 08/10/14 --  Follow up for dyspnea with eval as above. He underwent PFT > severe obstruction, hyperinflation, positive BD response, decrease DLCO that corrects for [place]. Last time we changed combivent to stiolto > he took it for 7 days, wasn't sure that it helped him very much. He did not go back to combivent.    Review of Systems  Constitutional: Negative for fever and unexpected weight change.  HENT: Positive for congestion and voice change. Negative for dental problem, ear pain, nosebleeds, postnasal drip, rhinorrhea, sinus pressure, sneezing, sore throat and trouble swallowing.   Eyes: Negative for redness and itching.  Respiratory: Positive for shortness of breath and wheezing. Negative for cough and chest tightness.   Cardiovascular: Negative for palpitations and leg swelling.  Gastrointestinal: Negative for nausea and vomiting.  Genitourinary: Negative for dysuria.  Musculoskeletal: Negative for joint swelling.  Skin: Negative for rash.  Neurological: Negative for headaches.  Hematological: Does not bruise/bleed easily.  Psychiatric/Behavioral: Negative for dysphoric mood. The patient is not nervous/anxious.       Objective:   Physical Exam Filed Vitals:   08/10/14 1408    BP: 130/82  Pulse: 72  Temp: 97.7 F (36.5 C)  TempSrc: Oral  Height: 5\' 9"  (1.753 m)  Weight: 173 lb (78.472 kg)  SpO2: 97%   Gen: Pleasant, well-nourished, in no distress,  normal affect  ENT: No lesions,  mouth dry, dentures, slight erythema  Neck: No JVD, no TMG, no carotid bruits  Lungs: No use of accessory muscles, clear without rales or rhonchi, no wheezes  Cardiovascular: RRR, heart sounds normal, no murmur or gallops, no peripheral edema  Musculoskeletal: No deformities, no cyanosis or clubbing  Neuro: alert, non focal  Skin: Warm, no lesions or rashes     08/30/08 spirometry >> mild AFL (Dr Maple Hudson)  05/21/14 CXR COMPARISON: 03/07/2014  FINDINGS:  Hyperinflation. Prior median sternotomy. Moderate thoracic  spondylosis. Three lead pacer is unchanged in position. Right  atrium, right ventricle, and coronary sinus. No discontinuity.  Midline trachea. Normal heart size. Atherosclerosis in the  transverse aorta. No pleural effusion or pneumothorax. No congestive  failure. Suspect a calcified granuloma projecting over the right  upper lobe. 6 mm.  IMPRESSION:  No acute cardiopulmonary disease   06/04/14 CPST --  Procedure: This patient underwent staged symptom-limited exercise treadmill testing using a modified Naughton protocol with expired gas analysis metabolic evaluation during exercise.  Demographics  Age: 80 Ht. (in.) 70 Wt. (lb) 168 BMI: 24.1 Predicted Peak VO2: 28.6 ml/kg/min  Gender: Male Ht (cm) 177.8 Wt. (kg) 76.2    Results  Pre-Exercise PFTs   FVC 2.25 (53%)   FEV1 1.39 (41%)   FEV1/FVC 62%   MVV 50 (38%)  Exercise Time: 11:05 Speed (mph): 3.0 Grade (%): 7.5    RPE: 15  Reason stopped: Patient stopped due to dyspnea (7/10)  Additional symptoms: None reported  Resting HR: 63 Peak HR: 126 (82% age predicted max HR)  BP rest: 122/70 BP peak: 190/80  Peak VO2: 20.3 (70.9% predicted peak VO2)  VE/VCO2 slope: 30.3  OUES:  2.32  Peak RER: 1.03  Ventilatory Threshold: 16.5 (57.6% predicted peak VO2)  Peak RR 37  Peak Ventilation: 53.8  VE/MVV: 108%  PETCO2 at peak: 35  O2pulse: 13 (93% predicted O2pulse)   Interpretation  Notes: Patient gave a good effort. The pulse-oximetry remained between 97-98% throughout the exercise.  ECG: Resting ECG in sinus rhythm with Biventricular pacing. There was an adequate HR response (beta-blockade considered) with continued biventricular pacing throughout the exercise, rare PVC and rare instances of atrial pacing. There was an appropriate BP response.  PFT: Resting spirometry demonstrates a severe obstructive/restrictive pattern. The MVV is severely reduced.  CPX: Exercise testing with gas exchange demonstrates a mildly reduced peak VO2 of 20.3 ml/kg/min (71% of the age/gender/weight matched sedentary norms). The RER of 1.03 indicates a sub-maximal aerobic eexercise. The VE/VCO2 slope is at the upper limit of normal. The oxygen uptake efficiency slope (OUES) is well above normal and suggestive of a higher potential functional capacity (this may be misleading due to the patient's limited ventilatory capacity). The VO2 at the ventilatory threshold was normal at 58% of the predicted peak VO2. At peak exercise, the ventilation reached 108% of the measured MVV indicating ventilatory limits had been reached (respiratory rate was within the expected range, Vt/IC [69%] was within the expected range, PETCO2 was normal). The O2pulse (a surrogate for stroke volume) increased throughout much of the exercise remaining unchanged during the last 2-3 minutes of exercise at 13 ml/beat (93% of predicted).   Conclusion: Exercise testing with gas exchange demonstrates a mild functional limitation when compared to matched sedentary norms. There is a primary ventilatory limitation to the exercise related to the patient's obstructive lung disease. The flattened O2pulse near  peak exercise may indicated circulatory limitations were being approached. There were no desaturations during exercise.    11/30/13 -- TTE Study Conclusions - Left ventricle: The cavity size was normal. There was moderate concentric hypertrophy. Systolic function was moderately to severely reduced. The estimated ejection fraction was in the range of 30% to 35%. Due to first degree atrioventricular block, there was fusion of early and atrial contributions to ventricular filling. Doppler parameters are consistent with elevated mean left atrial filling pressure. - Ventricular septum: Septal motion showed abnormal function, dyssynergy, and paradox. - Mitral valve: Calcified annulus. Valve area by pressure half-time: 2.37cm^2. - Left atrium: The atrium was mildly dilated.       Assessment & Plan:  COPD (chronic obstructive pulmonary disease) His PFT are most consistent with COPD and positive BD response. CXR does not show ILD. I believe a large component of his dyspnea is due to COPD, although he minimizes the sx here today. He did not feel benefit from stiolto, so will change back to combivent

## 2014-08-10 NOTE — Assessment & Plan Note (Addendum)
His PFT are most consistent with COPD and positive BD response. CXR does not show ILD. I believe a large component of his dyspnea is due to COPD, although he minimizes the sx here today. He did not feel benefit from stiolto, so will change back to combivent

## 2014-10-02 ENCOUNTER — Ambulatory Visit (INDEPENDENT_AMBULATORY_CARE_PROVIDER_SITE_OTHER): Payer: Medicare Other | Admitting: Cardiovascular Disease

## 2014-10-02 ENCOUNTER — Encounter: Payer: Self-pay | Admitting: Cardiovascular Disease

## 2014-10-02 VITALS — BP 108/74 | HR 72 | Ht 69.0 in | Wt 173.8 lb

## 2014-10-02 DIAGNOSIS — I480 Paroxysmal atrial fibrillation: Secondary | ICD-10-CM

## 2014-10-02 DIAGNOSIS — I447 Left bundle-branch block, unspecified: Secondary | ICD-10-CM

## 2014-10-02 DIAGNOSIS — I5042 Chronic combined systolic (congestive) and diastolic (congestive) heart failure: Secondary | ICD-10-CM

## 2014-10-02 DIAGNOSIS — I25810 Atherosclerosis of coronary artery bypass graft(s) without angina pectoris: Secondary | ICD-10-CM

## 2014-10-02 DIAGNOSIS — R0602 Shortness of breath: Secondary | ICD-10-CM

## 2014-10-02 DIAGNOSIS — I5022 Chronic systolic (congestive) heart failure: Secondary | ICD-10-CM

## 2014-10-02 DIAGNOSIS — I442 Atrioventricular block, complete: Secondary | ICD-10-CM

## 2014-10-02 MED ORDER — NITROGLYCERIN 0.4 MG SL SUBL
0.4000 mg | SUBLINGUAL_TABLET | SUBLINGUAL | Status: DC | PRN
Start: 2014-10-02 — End: 2024-03-01

## 2014-10-02 MED ORDER — LISINOPRIL 2.5 MG PO TABS: 2.5000 mg | ORAL_TABLET | Freq: Every day | ORAL | Status: DC

## 2014-10-02 NOTE — Patient Instructions (Addendum)
START Lisinopril 2.5mg  daily.  A new Rx has been sent to your pharmacy for NTG.    Your physician has requested that you have an echocardiogram. Echocardiography is a painless test that uses sound waves to create images of your heart. It provides your doctor with information about the size and shape of your heart and how well your heart's chambers and valves are working. This procedure takes approximately one hour. There are no restrictions for this procedure.  Dr. Royann Shivers recommends that you schedule a follow-up appointment in: 3 months.

## 2014-10-03 LAB — MDC_IDC_ENUM_SESS_TYPE_INCLINIC
Battery Voltage: 3.01 V
Brady Statistic AP VP Percent: 2.31 %
Brady Statistic AP VS Percent: 0 %
Brady Statistic AS VP Percent: 97.6 %
Brady Statistic AS VS Percent: 0.1 %
Lead Channel Impedance Value: 475 Ohm
Lead Channel Impedance Value: 513 Ohm
Lead Channel Impedance Value: 589 Ohm
Lead Channel Impedance Value: 589 Ohm
Lead Channel Impedance Value: 684 Ohm
Lead Channel Pacing Threshold Amplitude: 0.5 V
Lead Channel Pacing Threshold Amplitude: 0.625 V
Lead Channel Pacing Threshold Amplitude: 0.75 V
Lead Channel Pacing Threshold Pulse Width: 0.4 ms
Lead Channel Pacing Threshold Pulse Width: 0.4 ms
Lead Channel Pacing Threshold Pulse Width: 0.4 ms
Lead Channel Sensing Intrinsic Amplitude: 4.625 mV
Lead Channel Sensing Intrinsic Amplitude: 5 mV
Lead Channel Setting Pacing Amplitude: 2 V
Lead Channel Setting Pacing Amplitude: 2.5 V
Lead Channel Setting Pacing Pulse Width: 0.4 ms
Lead Channel Setting Sensing Sensitivity: 5.6 mV
MDC IDC MSMT BATTERY REMAINING LONGEVITY: 63 mo
MDC IDC MSMT LEADCHNL LV IMPEDANCE VALUE: 684 Ohm
MDC IDC MSMT LEADCHNL LV IMPEDANCE VALUE: 988 Ohm
MDC IDC MSMT LEADCHNL RA IMPEDANCE VALUE: 532 Ohm
MDC IDC MSMT LEADCHNL RA IMPEDANCE VALUE: 570 Ohm
MDC IDC MSMT LEADCHNL RV SENSING INTR AMPL: 15.75 mV
MDC IDC MSMT LEADCHNL RV SENSING INTR AMPL: 15.75 mV
MDC IDC SESS DTM: 20151208205316
MDC IDC SET LEADCHNL LV PACING PULSEWIDTH: 0.8 ms
MDC IDC SET LEADCHNL RA PACING AMPLITUDE: 2 V
MDC IDC STAT BRADY RA PERCENT PACED: 2.31 %
MDC IDC STAT BRADY RV PERCENT PACED: 99.9 %
Zone Setting Detection Interval: 350 ms
Zone Setting Detection Interval: 400 ms

## 2014-10-03 NOTE — Progress Notes (Signed)
Reason for office visit CHF, CAD, CRT-D, COPD  [name] continues to have problems with shortness of breath. He did have some improvement following revision of his left ventricular coronary sinus lead. He then underwent a cardiopulmonary stress test which showed that his shortness of breath is primarily related to ventilatory limitations. Towards peak exercise there was evidence of limitation and oxygen delivery indicating that he also has some circulatory limitations. He saw a pulmonary specialist a few weeks ago. He has evidence of severe obstruction and hyperinflation with a decent response to bronchodilators. His bronchodilators are being adjusted.  Having said that, he is able to split wood and carry it into the house on a daily basis. They heat the entire home with wood.  Not yet had his repeat echocardiogram which Dr. Ladona Ridgel ordered last summer.  Interrogation of his cardiac resynchronization pacemaker shows virtually 100% biventricular pacing and only 2.4% atrial pacing. Activity level is roughly 3 hours today. Heart rate histogram distribution is favorable. No meaningful ventricular or atrial tachyarrhythmia episodes have been recorded. Thoracic impedance showed a brief deterioration in early November that quickly returned to baseline.  He presented with complete heart block in April of 2013 and required a temporary transvenous pacemaker, undergoing implantation of a dual-chamber permanent pacemaker the next day. for years before that he had had a permanent left bundle branch block. He has a long-standing history of coronary disease and underwent bypass surgery in 2007. Just before getting his pacemaker he underwent cardiac catheterization which showed a patent sequential saphenous vein graft bypass to the first and second oblique marginal arteries (both of these vessels had previously been stented and had severe in-stent restenosis).  Left ventricular ejection fraction had been 45-50%  prior to that hospitalization. Following implantation of his pacemaker his echocardiogram showed moderately depressed left ventricular systolic function with an ejection fraction of 35-40%. He continues to have symptoms of shortness of breath on exertion (NYHA functional class III) but he has been working until today (he just retired). He briefly had return of A-V conduction for about a month, but now has nearly 100% ventricular pacing. He has no underlying escape rhythm today and is 100% paced. He requires relatively high doses of beta blockers for control of recurrent paroxysmal atrial tachycardia which was quite symptomatic. At the current dose of beta blocker it doesn't bother him at all. Attempts at treatment with ACE inhibitor in the past led to hypotension. He now seems to be able to tolerate them in very low doses only.  Allergies  Allergen Reactions  . Pollen Extract Other (See Comments)    "runny nose"    Current Outpatient Prescriptions  Medication Sig Dispense Refill  . albuterol-ipratropium (COMBIVENT) 18-103 MCG/ACT inhaler Inhale 2 puffs into the lungs every 6 (six) hours as needed for wheezing or shortness of breath.    [name] aspirin EC 81 MG tablet Take 81 mg by mouth daily.    [name] atorvastatin (LIPITOR) 40 MG tablet Take 40 mg by mouth at bedtime.     . clopidogrel (PLAVIX) 75 MG tablet TAKE ONE TABLET BY MOUTH DAILY. 90 tablet 2  . furosemide (LASIX) 20 MG tablet Take 1 tablet (20 mg total) by mouth daily. 30 tablet 11  . glipiZIDE (GLUCOTROL XL) 5 MG 24 hr tablet Take 5 mg by mouth 2 (two) times daily.     . isosorbide mononitrate (IMDUR) 30 MG 24 hr tablet TAKE ONE TABLET BY MOUTH ONCE DAILY. 90 tablet 2  .  metFORMIN (GLUCOPHAGE) 1000 MG tablet Take 1 tablet (1,000 mg total) by mouth 2 (two) times daily with a meal. HOLD for 2 days. Restart 03/10/2014.    . metoprolol (LOPRESSOR) 100 MG tablet TAKE ONE TABLET BY MOUTH TWICE DAILY 180 tablet 2  . nitroGLYCERIN (NITROSTAT) 0.4 MG SL  tablet Place 1 tablet (0.4 mg total) under the tongue every 5 (five) minutes as needed for chest pain. 25 tablet 6  . pantoprazole (PROTONIX) 40 MG tablet Take 40 mg by mouth daily as needed (acid reflux).     [name] lisinopril (PRINIVIL,ZESTRIL) 2.5 MG tablet Take 1 tablet (2.5 mg total) by mouth daily. 90 tablet 3   No current facility-administered medications for this visit.    Past Medical History  Diagnosis Date  . Coronary artery disease   . Psoriasis   . Hypertension   . COPD (chronic obstructive pulmonary disease)   . CHB (complete heart block) 02/11/12    Med[employer]onic permanent pacemaker  . DOE (dyspnea on exertion)     NYHA class 2-3  . Atrial tachycardia   . Cardiomyopathy     mixed ischemic and nonischemic  . Dyslipidemia   . LBBB (left bundle branch block)   . CHF (congestive heart failure)   . Myocardial infarction     "I've had 6" (01/03/2014)  . DVT (deep venous thrombosis) 12/2005    RLE "had to have OR"  . Sleep apnea     c pap at home, does not use  . Type II diabetes mellitus   . GERD (gastroesophageal reflux disease)     Past Surgical History  Procedure Laterality Date  . Coronary artery bypass graft  2007    x 3  . Cardiac catheterization  02/10/12    left CX: dominant w/40% prox. AV groove stenosis.The first & second marginal branches had stents visualized near the ostium of the vessel bu were functionally occluded. The remainder of the dominant CX had minor irregularities.  RCA: nondominant free of sign. disease. SVG to OM1 & OM2 sequentially was widely patent.  . US echocardiography  01/10/10    LV systolic fx mod to severely reduced, EF 35-40%, AOV mildly sclerotic  . Nm myocar perf wall motion  10/02/2009    no significant ischemia  . Pacemaker insertion  2013; 01/03/2014    MDT dual chamber pacemaker implanted 2013 for complete heart block; upgrade to CRTP (MDT) by Dr Ladona Ridgel 12/2013 due to cardiomyopathy and heart failure  . Coronary angioplasty with stent  placement      "I had 9 stents before the OHS in 2007" (01/03/2014)  . Cataract extraction Left   . Refractive surgery Right   . Electrophysiology study  01/03/2014    EPS by Dr Ladona Ridgel with no inducible ventricular arrhythmias  . Lead revision  03-06-2014    LV lead revision by Dr Ladona Ridgel    Family History  Problem Relation Age of Onset  . Cancer Father     History   Social History  . Marital Status: Married    Spouse Name: N/A    Number of Children: N/A  . Years of Education: N/A   Occupational History  . Not on file.   Social History Main Topics  . Smoking status: Former Smoker -- 15.00 packs/day for 36 years    Types: Cigars, Cigarettes    Quit date: 05/25/2005  . Smokeless tobacco: Never Used     Comment: 15 cigars/daily when smoking  . Alcohol Use: Yes  Comment: "quit alcohol in 1985 or 1986"  . Drug Use: No  . Sexual Activity: Not Currently   Other Topics Concern  . Not on file   Social History Narrative    Review of systems: Exertional dyspnea, NYHA functional class II The patient specifically denies any chest pain at rest or with exertion, dyspnea at rest or with exertion, orthopnea, paroxysmal nocturnal dyspnea, syncope, palpitations, focal neurological deficits, intermittent claudication, lower extremity edema, unexplained weight gain, cough, hemoptysis or wheezing.  The patient also denies abdominal pain, nausea, vomiting, dysphagia, diarrhea, constipation, polyuria, polydipsia, dysuria, hematuria, frequency, urgency, abnormal bleeding or bruising, fever, chills, unexpected weight changes, mood swings, change in skin or hair texture, change in voice quality, auditory or visual problems, allergic reactions or rashes, new musculoskeletal complaints other than usual "aches and pains".   PHYSICAL EXAM BP 108/74 mmHg  Pulse 72  Ht 5\' 9"  (1.753 m)  Wt 173 lb 12.8 oz (78.835 kg)  BMI 25.65 kg/m2  General: Alert, oriented x3, no distress Head: no evidence  of trauma, PERRL, EOMI, no exophtalmos or lid lag, no myxedema, no xanthelasma; normal ears, nose and oropharynx Neck: normal jugular venous pulsations and no hepatojugular reflux; brisk carotid pulses without delay and no carotid bruits Chest: clear to auscultation, no signs of consolidation by percussion or palpation, normal fremitus, symmetrical and full respiratory excursions Cardiovascular: normal position and quality of the apical impulse, regular rhythm, normal first and second heart sounds, no murmurs, rubs or gallops Abdomen: no tenderness or distention, no masses by palpation, no abnormal pulsatility or arterial bruits, normal bowel sounds, no hepatosplenomegaly Extremities: no clubbing, cyanosis or edema; 2+ radial, ulnar and brachial pulses bilaterally; 2+ right femoral, posterior tibial and dorsalis pedis pulses; 2+ left femoral, posterior tibial and dorsalis pedis pulses; no subclavian or femoral bruits Neurological: grossly nonfocal   EKG: Atrial sensed, biventricular paced. His electrocardiogram suggests excellent resynchronization therapy with a QRS complex that is positive in lead V1 and is only 102 ms in duration.  Lipid Panel     Component Value Date/Time   CHOL 135 10/16/2011 0400   TRIG 187* 10/16/2011 0400   HDL 34* 10/16/2011 0400   CHOLHDL 4.0 10/16/2011 0400   VLDL 37 10/16/2011 0400   LDLCALC 64 10/16/2011 0400    BMET    Component Value Date/Time   NA 137 05/01/2014 0910   K 3.9 05/01/2014 0910   CL 100 05/01/2014 0910   CO2 29 05/01/2014 0910   GLUCOSE 199* 05/01/2014 0910   BUN 21 05/01/2014 0910   CREATININE 0.8 05/01/2014 0910   CALCIUM 10.0 05/01/2014 0910   GFRNONAA >90 02/11/2012 0500   GFRAA >90 02/11/2012 0500     ASSESSMENT AND PLAN  Although [name] also does not think he has had full functional recovery, I think there is objective evidence that he is better. He is able to chop wood on a daily basis without feeling much in the way of  limitation.  His recent cardiopulmonary Exercise test shows that most of his limitation is now related to respiratory problems. We'll repeat his echocardiogram to see if we can get information regarding left atrial filling pressures and pulmonary artery pressure as well as to reassess left ventricle systolic function now that he has adequate resynchronization therapy again.   No changes are made to his cardiac medications today. His ACE inhibitor has had to be stopped in the past due to hypotension. His blood pressure seems to give [place] a little  room now and will try a very low dose of lisinopril.  Patient Instructions  START Lisinopril 2.5mg  daily.  A new Rx has been sent to your pharmacy for NTG.    Your physician has requested that you have an echocardiogram. Echocardiography is a painless test that uses sound waves to create images of your heart. It provides your doctor with information about the size and shape of your heart and how well your heart's chambers and valves are working. This procedure takes approximately one hour. There are no restrictions for this procedure.  Dr. Royann Shiversroitoru recommends that you schedule a follow-up appointment in: 3 months.         Orders Placed This Encounter  Procedures  . EKG 12-Lead  . 2D Echocardiogram without contrast   Meds ordered this encounter  Medications  . albuterol-ipratropium (COMBIVENT) 18-103 MCG/ACT inhaler    Sig: Inhale 2 puffs into the lungs every 6 (six) hours as needed for wheezing or shortness of breath.  . nitroGLYCERIN (NITROSTAT) 0.4 MG SL tablet    Sig: Place 1 tablet (0.4 mg total) under the tongue every 5 (five) minutes as needed for chest pain.    Dispense:  25 tablet    Refill:  6  . lisinopril (PRINIVIL,ZESTRIL) 2.5 MG tablet    Sig: Take 1 tablet (2.5 mg total) by mouth daily.    Dispense:  90 tablet    Refill:  3    [name]  Thurmon FairMihai Everlene Cunning, MD, [facility] Hospital[facility]ACC [facility] [phone] office [phone]  pager

## 2014-10-04 ENCOUNTER — Ambulatory Visit (HOSPITAL_COMMUNITY)
Admission: RE | Admit: 2014-10-04 | Discharge: 2014-10-04 | Disposition: A | Discharge status: Home / Self Care | Payer: Medicare Other | Source: Ambulatory Visit | Attending: Cardiovascular Disease | Admitting: Cardiovascular Disease

## 2014-10-04 ENCOUNTER — Encounter (HOSPITAL_COMMUNITY): Payer: Self-pay | Admitting: Cardiovascular Disease

## 2014-10-04 DIAGNOSIS — R0602 Shortness of breath: Secondary | ICD-10-CM | POA: Insufficient documentation

## 2014-10-04 DIAGNOSIS — I25810 Atherosclerosis of coronary artery bypass graft(s) without angina pectoris: Secondary | ICD-10-CM

## 2014-10-04 DIAGNOSIS — I517 Cardiomegaly: Secondary | ICD-10-CM

## 2014-10-04 NOTE — Progress Notes (Signed)
2D Echo Performed 10/04/2014    Dayzha Pogosyan, RCS  

## 2014-11-19 ENCOUNTER — Other Ambulatory Visit: Payer: Self-pay

## 2014-11-19 ENCOUNTER — Telehealth: Payer: Self-pay | Admitting: Cardiovascular Disease

## 2014-11-19 DIAGNOSIS — I5042 Chronic combined systolic (congestive) and diastolic (congestive) heart failure: Secondary | ICD-10-CM

## 2014-11-19 MED ORDER — ISOSORBIDE MONONITRATE ER 30 MG PO TB24: 30.0000 mg | ORAL_TABLET | Freq: Every day | ORAL | Status: DC

## 2014-11-19 MED ORDER — CLOPIDOGREL BISULFATE 75 MG PO TABS: 75.0000 mg | ORAL_TABLET | Freq: Every day | ORAL | Status: DC

## 2014-11-19 MED ORDER — METOPROLOL TARTRATE 100 MG PO TABS: 100.0000 mg | ORAL_TABLET | Freq: Two times a day (BID) | ORAL | Status: DC

## 2014-11-19 MED ORDER — FUROSEMIDE 20 MG PO TABS: 20.0000 mg | ORAL_TABLET | Freq: Every day | ORAL | Status: DC

## 2014-11-19 NOTE — Telephone Encounter (Signed)
Pt need new prescriptions for his generic Lasix,generic Plavix,and generic Metoprolol. Please call to [facility]-[phone].Please call these in today.

## 2014-11-19 NOTE — Telephone Encounter (Signed)
Refill submitted to patient's preferred pharmacy. Left message on pt's machine.

## 2014-11-19 NOTE — Telephone Encounter (Signed)
Rx sent to pharmacy   

## 2015-01-02 ENCOUNTER — Encounter: Payer: Medicare Other | Admitting: Cardiovascular Disease

## 2015-03-12 ENCOUNTER — Encounter: Payer: Self-pay | Admitting: Cardiovascular Disease

## 2015-03-12 ENCOUNTER — Ambulatory Visit (INDEPENDENT_AMBULATORY_CARE_PROVIDER_SITE_OTHER): Payer: Medicare Other | Admitting: Cardiovascular Disease

## 2015-03-12 VITALS — BP 134/72 | HR 68 | Resp 16 | Ht 69.0 in | Wt 171.4 lb

## 2015-03-12 DIAGNOSIS — I5042 Chronic combined systolic (congestive) and diastolic (congestive) heart failure: Secondary | ICD-10-CM

## 2015-03-12 DIAGNOSIS — I10 Essential (primary) hypertension: Secondary | ICD-10-CM

## 2015-03-12 DIAGNOSIS — I480 Paroxysmal atrial fibrillation: Secondary | ICD-10-CM

## 2015-03-12 DIAGNOSIS — I442 Atrioventricular block, complete: Secondary | ICD-10-CM | POA: Diagnosis not present

## 2015-03-12 DIAGNOSIS — Z950 Presence of cardiac pacemaker: Secondary | ICD-10-CM

## 2015-03-12 DIAGNOSIS — I447 Left bundle-branch block, unspecified: Secondary | ICD-10-CM | POA: Diagnosis not present

## 2015-03-12 DIAGNOSIS — I25810 Atherosclerosis of coronary artery bypass graft(s) without angina pectoris: Secondary | ICD-10-CM

## 2015-03-12 NOTE — Patient Instructions (Signed)
Remote monitoring is used to monitor your Pacemaker or ICD from home. This monitoring reduces the number of office visits required to check your device to one time per year. It allows [place] to monitor the functioning of your device to ensure it is working properly. You are scheduled for a device check from home on June 13, 2015. You may send your transmission at any time that day. If you have a wireless device, the transmission will be sent automatically. After your physician reviews your transmission, you will receive a postcard with your next transmission date.  Dr. Royann Shivers recommends that you schedule a follow-up appointment in: One year.

## 2015-03-12 NOTE — Progress Notes (Signed)
Patient ID: [name], male   DOB: 1946-04-13, 69 y.o.   MRN: [id number]     Cardiology Office Note   Date:  03/12/2015   ID:  [name], DOB [birth year], MRN [id number]  PCP:  Irving Copas, MD  Cardiologist:   Thurmon Fair, MD   Chief Complaint  Patient presents with  . Follow-up    pt c/o tightness that comes and goes, SOB      History of Present Illness: [name] is a 69 y.o. male who presents for follow-up for ischemic cardiomyopathy with chronic systolic and diastolic heart failure, complete heart block and CRT-P device check.  There has been little change in his chronic pattern of functional class II exertional dyspnea, which has been shown by cardiopulmonary stress testing to be mostly related to severe obstructive lung disease, although there are some cardio circulatory components as well. He can climb one or 2 stories and split wood without a lot of difficulty.  Interrogation of his biventricular pacemaker shows normal device function. Activity is constant at roughly 2.7 hours per day. Generator longevity is estimated at 4-5 years. There is 99.9% ventricular pacing and 2.5% atrial pacing with a favorable heart rate histogram distribution. Only one episode of brief nonsustained ventricular tachycardia consisting of 6 beats has been recorded. Pacing thresholds remain good. His optivol does not show any decrease in thoracic impedance to suggest fluid accumulation.  He has a long-standing history of coronary disease and underwent bypass surgery in 2007. He had left bundle branch block for years and in 2013 received a dual-chamber permanent pacemaker for complete heart block Just before getting his pacemaker he underwent cardiac catheterization which showed a patent sequential saphenous vein graft bypass to the first and second oblique marginal arteries (both of these vessels had previously been stented and had severe in-stent restenosis).  He has no underlying escape  rhythm today and is 100% paced. He requires relatively high doses of beta blockers for control of recurrent paroxysmal atrial tachycardia which was quite symptomatic. Left ventricular ejection fraction had been 45-50% prior to pacemaker implantation. Following implantation of his pacemaker his echocardiogram showed moderately depressed left ventricular systolic function with an ejection fraction of 35-40%. He improved following biventricular pacemaker upgrade, but then had lead dislodgment and deteriorated again. After repositioning of his left ventricular lead, left ventricular ejection fraction increased to 50-55 percent - by his most recent echocardiogram in December 2015.  He has no underlying escape rhythm today and is 100% paced.  Attempts at treatment with ACE inhibitor in the past led to severe symptomatic hypotension. He now seems to be able to tolerate them in very low doses only.  Past Medical History  Diagnosis Date  . Coronary artery disease   . Psoriasis   . Hypertension   . COPD (chronic obstructive pulmonary disease)   . CHB (complete heart block) 02/11/12    Medtronic permanent pacemaker  . DOE (dyspnea on exertion)     NYHA class 2-3  . Atrial tachycardia   . Cardiomyopathy     mixed ischemic and nonischemic  . Dyslipidemia   . LBBB (left bundle branch block)   . CHF (congestive heart failure)   . Myocardial infarction     "I've had 6" (01/03/2014)  . DVT (deep venous thrombosis) 12/2005    RLE "had to have OR"  . Sleep apnea     c pap at home, does not use  . Type II diabetes mellitus   .  GERD (gastroesophageal reflux disease)     Past Surgical History  Procedure Laterality Date  . Coronary artery bypass graft  2007    x 3  . Cardiac catheterization  02/10/12    left CX: dominant w/40% prox. AV groove stenosis.The first & second marginal branches had stents visualized near the ostium of the vessel bu were functionally occluded. The remainder of the dominant CX had  minor irregularities.  RCA: nondominant free of sign. disease. SVG to OM1 & OM2 sequentially was widely patent.  . US echocardiography  01/10/10    LV systolic fx mod to severely reduced, EF 35-40%, AOV mildly sclerotic  . Nm myocar perf wall motion  10/02/2009    no significant ischemia  . Pacemaker insertion  2013; 01/03/2014    MDT dual chamber pacemaker implanted 2013 for complete heart block; upgrade to CRTP (MDT) by Dr Ladona Ridgel 12/2013 due to cardiomyopathy and heart failure  . Coronary angioplasty with stent placement      "I had 9 stents before the OHS in 2007" (01/03/2014)  . Cataract extraction Left   . Refractive surgery Right   . Electrophysiology study  01/03/2014    EPS by Dr Ladona Ridgel with no inducible ventricular arrhythmias  . Lead revision  03-06-2014    LV lead revision by Dr Ladona Ridgel  . Temporary pacemaker insertion Bilateral 02/10/2012    Procedure: TEMPORARY PACEMAKER INSERTION;  Surgeon: Runell Gess, MD;  Location: [facility] CATH LAB;  Service: Cardiovascular;  Laterality: Bilateral;  . Left heart catheterization with coronary/graft angiogram  02/10/2012    Procedure: LEFT HEART CATHETERIZATION WITH Isabel Caprice;  Surgeon: Runell Gess, MD;  Location: [facility] CATH LAB;  Service: Cardiovascular;;  . Permanent pacemaker insertion N/A 02/11/2012    Procedure: PERMANENT PACEMAKER INSERTION;  Surgeon: Thurmon Fair, MD;  Location: [facility] CATH LAB;  Service: Cardiovascular;  Laterality: N/A;  . Electrophysiology study N/A 01/03/2014    Procedure: ELECTROPHYSIOLOGY STUDY;  Surgeon: Marinus Maw, MD;  Location: [facility] CATH LAB;  Service: Cardiovascular;  Laterality: N/A;  . Bi-ventricular pacemaker revision N/A 03/06/2014    Procedure: BI-VENTRICULAR PACEMAKER REVISION (CRT-R);  Surgeon: Marinus Maw, MD;  Location: [facility] CATH LAB;  Service: Cardiovascular;  Laterality: N/A;     Current Outpatient Prescriptions  Medication Sig Dispense Refill  . albuterol-ipratropium (COMBIVENT) 18-103  MCG/ACT inhaler Inhale 2 puffs into the lungs every 6 (six) hours as needed for wheezing or shortness of breath.    [name] aspirin EC 81 MG tablet Take 81 mg by mouth daily.    [name] atorvastatin (LIPITOR) 40 MG tablet Take 40 mg by mouth at bedtime.     . clopidogrel (PLAVIX) 75 MG tablet Take 1 tablet (75 mg total) by mouth daily. 90 tablet 3  . furosemide (LASIX) 20 MG tablet Take 1 tablet (20 mg total) by mouth daily. 90 tablet 3  . glipiZIDE (GLUCOTROL XL) 5 MG 24 hr tablet Take 5 mg by mouth 2 (two) times daily.     . isosorbide mononitrate (IMDUR) 30 MG 24 hr tablet Take 1 tablet (30 mg total) by mouth daily. 90 tablet 2  . lisinopril (PRINIVIL,ZESTRIL) 2.5 MG tablet Take 1 tablet (2.5 mg total) by mouth daily. 90 tablet 3  . metFORMIN (GLUCOPHAGE) 1000 MG tablet Take 1 tablet (1,000 mg total) by mouth 2 (two) times daily with a meal. HOLD for 2 days. Restart 03/10/2014.    . metoprolol (LOPRESSOR) 100 MG tablet Take 1 tablet (100 mg total) by mouth  2 (two) times daily. 180 tablet 3  . nitroGLYCERIN (NITROSTAT) 0.4 MG SL tablet Place 1 tablet (0.4 mg total) under the tongue every 5 (five) minutes as needed for chest pain. 25 tablet 6  . pantoprazole (PROTONIX) 40 MG tablet Take 40 mg by mouth daily as needed (acid reflux).      No current facility-administered medications for this visit.    Allergies:   Pollen extract    Social History:  The patient  reports that he quit smoking about 9 years ago. His smoking use included Cigars and Cigarettes. He has a 540 pack-year smoking history. He has never used smokeless tobacco. He reports that he drinks alcohol. He reports that he does not use illicit drugs.   Family History:  The patient's family history includes Cancer in his father.    ROS:  Please see the history of present illness.    Otherwise, review of systems positive for none.   All other systems are reviewed and negative.    PHYSICAL EXAM: VS:  BP 134/72 mmHg  Pulse 68  Ht   (1.753 m)  Wt 171 lb 6.4 oz (77.747 kg)  BMI 25.30 kg/m2 , BMI Body mass index is 25.3 kg/(m^2).  General: Alert, oriented x3, no distress Head: no evidence of trauma, PERRL, EOMI, no exophtalmos or lid lag, no myxedema, no xanthelasma; normal ears, nose and oropharynx Neck: normal jugular venous pulsations and no hepatojugular reflux; brisk carotid pulses without delay and no carotid bruits Chest: clear to auscultation, no signs of consolidation by percussion or palpation, normal fremitus, symmetrical and full respiratory excursions. Healthy pacemaker site and sternotomy scars Cardiovascular: normal position and quality of the apical impulse, regular rhythm, normal first and second heart sounds, no murmurs, rubs or gallops Abdomen: no tenderness or distention, no masses by palpation, no abnormal pulsatility or arterial bruits, normal bowel sounds, no hepatosplenomegaly Extremities: no clubbing, cyanosis or edema; 2+ radial, ulnar and brachial pulses bilaterally; 2+ right femoral, posterior tibial and dorsalis pedis pulses; 2+ left femoral, posterior tibial and dorsalis pedis pulses; no subclavian or femoral bruits Neurological: grossly nonfocal Psych: euthymic mood, full affect   EKG:  EKG is not ordered today.   Recent Labs: 05/01/2014: BUN 21; Creatinine 0.8; Potassium 3.9; Sodium 137    Lipid Panel    Component Value Date/Time   CHOL 135 10/16/2011 0400   TRIG 187* 10/16/2011 0400   HDL 34* 10/16/2011 0400   CHOLHDL 4.0 10/16/2011 0400   VLDL 37 10/16/2011 0400   LDLCALC 64 10/16/2011 0400      Wt Readings from Last 3 Encounters:  03/12/15 171 lb 6.4 oz (77.747 kg)  10/02/14 173 lb 12.8 oz (78.835 kg)  08/10/14 173 lb (78.472 kg)       ASSESSMENT AND PLAN:  1. CAD s/p CABG - asymptomatic, no exertional angina. Last cardiac catheterization 2013. The perfusion study 2012 showed inferior and inferoseptal fixed defect.  2. Ischemic cardiomyopathy - mild, with evidence of  inferior hypokinesis and left ventricular ejection fraction at around 50%  3. Chronic diastolic heart failure - he has functional class II, but his dyspnea is multifactorial related both to his cardiac disease and in larger part to chronic obstructive pulmonary disease  4. Normal biventricular pacemaker function with near 100% by the pacing  5. Diabetes mellitus type 2, uncontrolled. - He tells me his last hemoglobin A1c was 8.5% and he is still working with his primary physician to achieve better glycemic control  6.  COPD - quit smoking roughly 10 years ago. No wheezing today  7. Hyperlipidemia - excellent lipid profile on atorvastatin   Current medicines are reviewed at length with the patient today.  The patient does not have concerns regarding medicines.  The following changes have been made:  no change  Labs/ tests ordered today include:  No orders of the defined types were placed in this encounter.   Patient Instructions  Remote monitoring is used to monitor your Pacemaker or ICD from home. This monitoring reduces the number of office visits required to check your device to one time per year. It allows [place] to monitor the functioning of your device to ensure it is working properly. You are scheduled for a device check from home on June 13, 2015. You may send your transmission at any time that day. If you have a wireless device, the transmission will be sent automatically. After your physician reviews your transmission, you will receive a postcard with your next transmission date.  Dr. Royann Shivers recommends that you schedule a follow-up appointment in: One year.       Joie Bimler, MD  03/12/2015 2:53 PM    Thurmon Fair, MD, [facility] [phone] office [phone] pager

## 2015-04-01 LAB — CUP PACEART INCLINIC DEVICE CHECK
Battery Remaining Longevity: 58 mo
Battery Voltage: 3.01 V
Brady Statistic AP VP Percent: 2.47 %
Brady Statistic AP VS Percent: 0 %
Brady Statistic RV Percent Paced: 99.83 %
Lead Channel Impedance Value: 1045 Ohm
Lead Channel Impedance Value: 456 Ohm
Lead Channel Impedance Value: 532 Ohm
Lead Channel Impedance Value: 589 Ohm
Lead Channel Impedance Value: 741 Ohm
Lead Channel Pacing Threshold Amplitude: 0.5 V
Lead Channel Pacing Threshold Pulse Width: 0.4 ms
Lead Channel Pacing Threshold Pulse Width: 0.4 ms
Lead Channel Sensing Intrinsic Amplitude: 4.5 mV
Lead Channel Sensing Intrinsic Amplitude: 4.5 mV
Lead Channel Setting Pacing Amplitude: 2 V
Lead Channel Setting Pacing Amplitude: 2.5 V
Lead Channel Setting Pacing Pulse Width: 0.4 ms
MDC IDC MSMT LEADCHNL LV IMPEDANCE VALUE: 627 Ohm
MDC IDC MSMT LEADCHNL LV IMPEDANCE VALUE: 703 Ohm
MDC IDC MSMT LEADCHNL RA IMPEDANCE VALUE: 589 Ohm
MDC IDC MSMT LEADCHNL RV IMPEDANCE VALUE: 513 Ohm
MDC IDC MSMT LEADCHNL RV PACING THRESHOLD AMPLITUDE: 0.625 V
MDC IDC MSMT LEADCHNL RV SENSING INTR AMPL: 16.5 mV
MDC IDC MSMT LEADCHNL RV SENSING INTR AMPL: 16.5 mV
MDC IDC SESS DTM: 20160517203736
MDC IDC SET LEADCHNL LV PACING AMPLITUDE: 2 V
MDC IDC SET LEADCHNL LV PACING PULSEWIDTH: 0.8 ms
MDC IDC SET LEADCHNL RV SENSING SENSITIVITY: 5.6 mV
MDC IDC STAT BRADY AS VP PERCENT: 97.36 %
MDC IDC STAT BRADY AS VS PERCENT: 0.17 %
MDC IDC STAT BRADY RA PERCENT PACED: 2.47 %
Zone Setting Detection Interval: 350 ms
Zone Setting Detection Interval: 400 ms

## 2015-04-04 ENCOUNTER — Encounter: Payer: Self-pay | Admitting: Cardiovascular Disease

## 2015-06-13 ENCOUNTER — Ambulatory Visit (INDEPENDENT_AMBULATORY_CARE_PROVIDER_SITE_OTHER): Payer: Medicare Other | Admitting: *Deleted

## 2015-06-13 DIAGNOSIS — I5022 Chronic systolic (congestive) heart failure: Secondary | ICD-10-CM

## 2015-06-13 DIAGNOSIS — I442 Atrioventricular block, complete: Secondary | ICD-10-CM

## 2015-06-13 NOTE — Progress Notes (Signed)
Remote pacemaker transmission.   

## 2015-06-21 LAB — CUP PACEART REMOTE DEVICE CHECK
Battery Remaining Longevity: 56 mo
Battery Voltage: 3 V
Brady Statistic AP VP Percent: 3.58 %
Brady Statistic AS VS Percent: 0.07 %
Brady Statistic RV Percent Paced: 99.93 %
Lead Channel Impedance Value: 1045 Ohm
Lead Channel Impedance Value: 551 Ohm
Lead Channel Impedance Value: 608 Ohm
Lead Channel Impedance Value: 627 Ohm
Lead Channel Pacing Threshold Pulse Width: 0.4 ms
Lead Channel Pacing Threshold Pulse Width: 0.4 ms
Lead Channel Sensing Intrinsic Amplitude: 14.625 mV
Lead Channel Sensing Intrinsic Amplitude: 14.625 mV
Lead Channel Sensing Intrinsic Amplitude: 3.5 mV
Lead Channel Sensing Intrinsic Amplitude: 3.5 mV
Lead Channel Setting Pacing Amplitude: 2 V
Lead Channel Setting Pacing Amplitude: 2 V
Lead Channel Setting Pacing Amplitude: 2.5 V
Lead Channel Setting Pacing Pulse Width: 0.8 ms
Lead Channel Setting Sensing Sensitivity: 5.6 mV
MDC IDC MSMT LEADCHNL LV IMPEDANCE VALUE: 608 Ohm
MDC IDC MSMT LEADCHNL LV IMPEDANCE VALUE: 722 Ohm
MDC IDC MSMT LEADCHNL LV IMPEDANCE VALUE: 760 Ohm
MDC IDC MSMT LEADCHNL RA PACING THRESHOLD AMPLITUDE: 0.5 V
MDC IDC MSMT LEADCHNL RV IMPEDANCE VALUE: 494 Ohm
MDC IDC MSMT LEADCHNL RV IMPEDANCE VALUE: 551 Ohm
MDC IDC MSMT LEADCHNL RV PACING THRESHOLD AMPLITUDE: 0.625 V
MDC IDC SESS DTM: 20160818204223
MDC IDC SET LEADCHNL RV PACING PULSEWIDTH: 0.4 ms
MDC IDC SET ZONE DETECTION INTERVAL: 400 ms
MDC IDC STAT BRADY AP VS PERCENT: 0 %
MDC IDC STAT BRADY AS VP PERCENT: 96.35 %
MDC IDC STAT BRADY RA PERCENT PACED: 3.58 %
Zone Setting Detection Interval: 350 ms

## 2015-06-26 ENCOUNTER — Encounter: Payer: Self-pay | Admitting: Cardiology

## 2015-07-02 ENCOUNTER — Encounter: Payer: Self-pay | Admitting: Internal Medicine

## 2015-09-16 ENCOUNTER — Telehealth: Payer: Self-pay | Admitting: Cardiology

## 2015-09-16 ENCOUNTER — Ambulatory Visit (INDEPENDENT_AMBULATORY_CARE_PROVIDER_SITE_OTHER): Payer: Medicare Other | Admitting: *Deleted

## 2015-09-16 DIAGNOSIS — I5022 Chronic systolic (congestive) heart failure: Secondary | ICD-10-CM

## 2015-09-16 DIAGNOSIS — I442 Atrioventricular block, complete: Secondary | ICD-10-CM

## 2015-09-16 NOTE — Telephone Encounter (Signed)
Spoke with pt and reminded pt of remote transmission that is due today. Pt verbalized understanding.   

## 2015-09-17 ENCOUNTER — Encounter: Payer: Self-pay | Admitting: Cardiology

## 2015-09-17 ENCOUNTER — Telehealth: Payer: Self-pay | Admitting: Cardiovascular Disease

## 2015-09-17 NOTE — Telephone Encounter (Signed)
Spoke w/ pt wife and informed her that pt can send remote transmission today. She verbalized understanding and said she would let pt know.

## 2015-09-17 NOTE — Telephone Encounter (Signed)
Pt forgot to do his remote check yesterday,he wants to know if he can do it today?

## 2015-09-18 LAB — CUP PACEART REMOTE DEVICE CHECK
Battery Remaining Longevity: 53 mo
Battery Voltage: 3 V
Brady Statistic AS VS Percent: 0.1 %
Brady Statistic RA Percent Paced: 4.55 %
Date Time Interrogation Session: 20161123012753
Implantable Lead Implant Date: 20130418
Implantable Lead Location: 753858
Implantable Lead Location: 753859
Implantable Lead Location: 753860
Implantable Lead Model: 5086
Lead Channel Impedance Value: 494 Ohm
Lead Channel Impedance Value: 551 Ohm
Lead Channel Impedance Value: 551 Ohm
Lead Channel Impedance Value: 589 Ohm
Lead Channel Impedance Value: 684 Ohm
Lead Channel Pacing Threshold Amplitude: 0.75 V
Lead Channel Pacing Threshold Pulse Width: 0.4 ms
Lead Channel Sensing Intrinsic Amplitude: 10.75 mV
Lead Channel Sensing Intrinsic Amplitude: 10.75 mV
Lead Channel Setting Pacing Amplitude: 2 V
Lead Channel Setting Pacing Amplitude: 2 V
Lead Channel Setting Pacing Amplitude: 2.5 V
Lead Channel Setting Pacing Pulse Width: 0.4 ms
Lead Channel Setting Pacing Pulse Width: 0.8 ms
MDC IDC LEAD IMPLANT DT: 20130418
MDC IDC LEAD IMPLANT DT: 20150312
MDC IDC LEAD MODEL: 5086
MDC IDC MSMT LEADCHNL LV IMPEDANCE VALUE: 570 Ohm
MDC IDC MSMT LEADCHNL LV IMPEDANCE VALUE: 684 Ohm
MDC IDC MSMT LEADCHNL LV IMPEDANCE VALUE: 969 Ohm
MDC IDC MSMT LEADCHNL RA IMPEDANCE VALUE: 608 Ohm
MDC IDC MSMT LEADCHNL RA SENSING INTR AMPL: 4.25 mV
MDC IDC MSMT LEADCHNL RA SENSING INTR AMPL: 4.25 mV
MDC IDC SET LEADCHNL RV SENSING SENSITIVITY: 5.6 mV
MDC IDC STAT BRADY AP VP PERCENT: 4.55 %
MDC IDC STAT BRADY AP VS PERCENT: 0 %
MDC IDC STAT BRADY AS VP PERCENT: 95.35 %
MDC IDC STAT BRADY RV PERCENT PACED: 99.9 %

## 2015-09-18 NOTE — Progress Notes (Signed)
Remote pacemaker transmission.   

## 2015-09-23 ENCOUNTER — Encounter: Payer: Self-pay | Admitting: Cardiology

## 2015-10-25 ENCOUNTER — Other Ambulatory Visit: Payer: Self-pay | Admitting: Internal Medicine

## 2015-10-25 ENCOUNTER — Other Ambulatory Visit: Payer: Self-pay | Admitting: Cardiovascular Disease

## 2015-10-25 NOTE — Telephone Encounter (Signed)
Rx request sent to pharmacy.  

## 2015-12-16 ENCOUNTER — Ambulatory Visit (INDEPENDENT_AMBULATORY_CARE_PROVIDER_SITE_OTHER): Payer: Medicare Other | Admitting: *Deleted

## 2015-12-16 ENCOUNTER — Telehealth: Payer: Self-pay | Admitting: Cardiology

## 2015-12-16 DIAGNOSIS — I442 Atrioventricular block, complete: Secondary | ICD-10-CM | POA: Diagnosis not present

## 2015-12-16 NOTE — Progress Notes (Signed)
Carelink Summary Report / Loop Recorder 

## 2015-12-16 NOTE — Telephone Encounter (Signed)
Spoke with pt and reminded pt of remote transmission that is due today. Pt verbalized understanding he also stated that he has not been feeling well. Patient c/o low blood pressures.

## 2015-12-19 ENCOUNTER — Telehealth: Payer: Self-pay | Admitting: Cardiovascular Disease

## 2015-12-19 NOTE — Telephone Encounter (Signed)
Attempted to return patient's call x2. Busy signal.

## 2015-12-19 NOTE — Telephone Encounter (Signed)
Returned call- wife answered- asked me to call back for her husband to pick up the phone in the living room. Called back x 2- wife answered second call back- apparently husband not in the house. I let her know that his transmission was received and if he had further questions he can call back tomorrow. She verbalizes understanding.

## 2015-12-19 NOTE — Telephone Encounter (Signed)
Transmission reviewed- normal device function 99.9% Biv pacing. No episodes. Deferring to general cardiology.

## 2015-12-19 NOTE — Telephone Encounter (Signed)
New Message  Pt calling to speak w/ Device concerning results of remote transmission- was returning mesasge on vm; Please call back and discuss.

## 2015-12-20 NOTE — Telephone Encounter (Signed)
Pt calling back. Transmission reviewed with patient- normal device function, no episodes. He inquired about the battery and his dependency on the device- I explained that we will monitor him closely as his battery is close to depletion. As of 12/16/15 3.5-5 years longevity estimated. He verbalizes understanding.

## 2015-12-26 ENCOUNTER — Other Ambulatory Visit: Payer: Self-pay | Admitting: Cardiovascular Disease

## 2015-12-27 NOTE — Telephone Encounter (Signed)
Rx request sent to pharmacy.  

## 2016-01-24 ENCOUNTER — Other Ambulatory Visit: Payer: Self-pay | Admitting: *Deleted

## 2016-01-24 MED ORDER — METOPROLOL TARTRATE 100 MG PO TABS: 100.0000 mg | ORAL_TABLET | Freq: Two times a day (BID) | ORAL | Status: DC

## 2016-01-24 MED ORDER — CLOPIDOGREL BISULFATE 75 MG PO TABS: 75.0000 mg | ORAL_TABLET | Freq: Every day | ORAL | Status: DC

## 2016-01-24 MED ORDER — ISOSORBIDE MONONITRATE ER 30 MG PO TB24: 30.0000 mg | ORAL_TABLET | Freq: Every day | ORAL | Status: DC

## 2016-01-24 MED ORDER — LISINOPRIL 2.5 MG PO TABS: 2.5000 mg | ORAL_TABLET | Freq: Every day | ORAL | Status: DC

## 2016-01-24 MED ORDER — FUROSEMIDE 20 MG PO TABS: 20.0000 mg | ORAL_TABLET | Freq: Every day | ORAL | Status: DC

## 2016-02-01 ENCOUNTER — Other Ambulatory Visit: Payer: Self-pay | Admitting: Internal Medicine

## 2016-02-01 ENCOUNTER — Other Ambulatory Visit: Payer: Self-pay | Admitting: Cardiovascular Disease

## 2016-02-03 NOTE — Telephone Encounter (Signed)
Rx request sent to pharmacy.  

## 2016-02-14 ENCOUNTER — Telehealth: Payer: Self-pay | Admitting: Emergency Medicine

## 2016-02-14 NOTE — Telephone Encounter (Signed)
Paperwork states that pt just needs to follow up with RB next available. lmtcb X1 for pt to schedule rov.  Papers that were faxed are on corkboard in triage until rov is scheduled.

## 2016-02-17 NOTE — Telephone Encounter (Signed)
Spoke with pt. He has been scheduled to see RB on 03/24/16 at 2:45pm.

## 2016-02-18 ENCOUNTER — Telehealth: Payer: Self-pay | Admitting: Cardiovascular Disease

## 2016-02-18 NOTE — Telephone Encounter (Signed)
Received records from [facility] for appointment on 03/04/16 with Dr Royann Shivers.  Records given to [facility] (medical records) for Dr Croitoru's schedule. lp

## 2016-03-04 ENCOUNTER — Encounter: Payer: Self-pay | Admitting: Cardiovascular Disease

## 2016-03-04 ENCOUNTER — Ambulatory Visit (INDEPENDENT_AMBULATORY_CARE_PROVIDER_SITE_OTHER): Payer: Medicare Other | Admitting: Cardiovascular Disease

## 2016-03-04 VITALS — BP 110/70 | HR 71 | Ht 70.0 in | Wt 169.0 lb

## 2016-03-04 DIAGNOSIS — I739 Peripheral vascular disease, unspecified: Secondary | ICD-10-CM

## 2016-03-04 DIAGNOSIS — I5042 Chronic combined systolic (congestive) and diastolic (congestive) heart failure: Secondary | ICD-10-CM | POA: Diagnosis not present

## 2016-03-04 DIAGNOSIS — I442 Atrioventricular block, complete: Secondary | ICD-10-CM | POA: Diagnosis not present

## 2016-03-04 DIAGNOSIS — I10 Essential (primary) hypertension: Secondary | ICD-10-CM | POA: Diagnosis not present

## 2016-03-04 DIAGNOSIS — I480 Paroxysmal atrial fibrillation: Secondary | ICD-10-CM | POA: Diagnosis not present

## 2016-03-04 DIAGNOSIS — J449 Chronic obstructive pulmonary disease, unspecified: Secondary | ICD-10-CM

## 2016-03-04 DIAGNOSIS — I25810 Atherosclerosis of coronary artery bypass graft(s) without angina pectoris: Secondary | ICD-10-CM

## 2016-03-04 DIAGNOSIS — Z950 Presence of cardiac pacemaker: Secondary | ICD-10-CM

## 2016-03-04 NOTE — Patient Instructions (Addendum)
Medication Instructions: Dr Royann Shivers recommends that you continue on your current medications as directed. Please refer to the Current Medication list given to you today.  Labwork: NONE ORDERED  Testing/Procedures: 1. Lower Extremity Arterial doppler - Your physician has requested that you have a lower or upper extremity arterial duplex. This test is an ultrasound of the arteries in the legs or arms. It looks at arterial blood flow in the legs and arms. Allow one hour for Lower and Upper Arterial scans. There are no restrictions or special instructions.  2. Remote Pacemaker Download - Remote monitoring is used to monitor your Pacemaker of ICD from home. This monitoring reduces the number of office visits required to check your device to one time per year. It allows [place] to keep an eye on the functioning of your device to ensure it is working properly. You are scheduled for a device check from home on Friday, August 11th, 2017. You may send your transmission at any time that day. If you have a wireless device, the transmission will be sent automatically. After your physician reviews your transmission, you will receive a postcard with your next transmission date.  Follow-up: Dr Royann Shivers recommends that you schedule a follow-up appointment in 1 year with a pacemaker check. You will receive a reminder letter in the mail two months in advance. If you don't receive a letter, please call our office to schedule the follow-up appointment.  If you need a refill on your cardiac medications before your next appointment, please call your pharmacy.

## 2016-03-04 NOTE — Progress Notes (Signed)
Patient ID: [name], male   DOB: [birth year], 70 y.o.   MRN: [id number]    Cardiology Office Note    Date:  03/05/2016   ID:  [name], DOB Dec 08, 1945, MRN [id number]  PCP:  Irving Copas, MD  Cardiologist:  [name], MD:  Thurmon Fair, MD   Chief Complaint  Patient presents with  . Annual Exam  . Shortness of Breath  . Chest Pain    History of Present Illness:  [name] is a 70 y.o. male with ischemic cardiomyopathy, chronic systolic and diastolic heart failure, complete heart block and a biventricular pacemaker. He also has advanced chronic obstructive lung disease related to long-standing previous smoking. COPD has been shown by cardiopulmonary stress testing to be major cause of his exertional dyspnea, although there are some cardio circulatory components as well. He also has type 2 diabetes mellitus on oral antidiabetics and a remote history of smoking.  Despite his multiple medical problems, [name] has had an uneventful year since his last office appointment. He maintains NYHA functional class II-III shortness of breath on exertion especially noticeable during warm weather. Nevertheless, he is still working out in the yard and is building a storage shed by himself. He has not had angina pectoris, orthopnea, PND, leg edema or focal neurological complaints. He complains about his feet feeling very cold and crampy, improving when he hangs them over the side of the bed. I'm not sure that he describes clear-cut intermittent claudication. His other complaint is worsening hoarseness. He has intermittent problems with psoriasis flares.  Pacemaker interrogation shows normal device function. He is pacemaker dependent due to complete heart block. His Medtronic consult the device was implanted in 2015 and has roughly 4 more years of generator longevity. He has 99.9% biventricular pacing as well as 3.3% atrial pacing. Parameters are unchanged. The device has recorded  infrequent episodes of brief paroxysmal atrial tachycardia and one 10 beat episode of nonsustained VT at 175 bpm (asymptomatic).  He has a long-standing history of coronary disease and underwent bypass surgery in 2007. He had left bundle branch block for years and in 2013 received a dual-chamber permanent pacemaker for complete heart block Just before getting his pacemaker he underwent cardiac catheterization which showed a patent sequential saphenous vein graft bypass to the first and second oblique marginal arteries (both of these vessels had previously been stented and had severe in-stent restenosis).  He has no underlying escape rhythm today and is 100% paced. He requires relatively high doses of beta blockers for control of recurrent paroxysmal atrial tachycardia which was quite symptomatic. Left ventricular ejection fraction had been 45-50% prior to pacemaker implantation. Following implantation of his pacemaker his echocardiogram showed moderately depressed left ventricular systolic function with an ejection fraction of 35-40%. He improved following biventricular pacemaker upgrade, but then had lead dislodgment and deteriorated again. After repositioning of his left ventricular lead, left ventricular ejection fraction increased to 50-55 percent - by his most recent echocardiogram in December 2015.  He has no underlying escape rhythm today and is 100% paced.  Attempts at treatment with ACE inhibitor in the past led to severe symptomatic hypotension. He now seems to be able to tolerate them in very low doses only.  Past Medical History  Diagnosis Date  . Coronary artery disease   . Psoriasis   . Hypertension   . COPD (chronic obstructive pulmonary disease) (HCC)   . CHB (complete heart block) (HCC) 02/11/12    Medtronic permanent pacemaker  .  DOE (dyspnea on exertion)     NYHA class 2-3  . Atrial tachycardia (HCC)   . Cardiomyopathy (HCC)     mixed ischemic and nonischemic  . Dyslipidemia    . LBBB (left bundle branch block)   . CHF (congestive heart failure) (HCC)   . Myocardial infarction (HCC)     "I've had 6" (01/03/2014)  . DVT (deep venous thrombosis) (HCC) 12/2005    RLE "had to have OR"  . Sleep apnea     c pap at home, does not use  . Type II diabetes mellitus (HCC)   . GERD (gastroesophageal reflux disease)     Past Surgical History  Procedure Laterality Date  . Coronary artery bypass graft  2007    x 3  . Cardiac catheterization  02/10/12    left CX: dominant w/40% prox. AV groove stenosis.The first & second marginal branches had stents visualized near the ostium of the vessel bu were functionally occluded. The remainder of the dominant CX had minor irregularities.  RCA: nondominant free of sign. disease. SVG to OM1 & OM2 sequentially was widely patent.  . US echocardiography  01/10/10    LV systolic fx mod to severely reduced, EF 35-40%, AOV mildly sclerotic  . Nm myocar perf wall motion  10/02/2009    no significant ischemia  . Pacemaker insertion  2013; 01/03/2014    MDT dual chamber pacemaker implanted 2013 for complete heart block; upgrade to CRTP (MDT) by Dr Ladona Ridgel 12/2013 due to cardiomyopathy and heart failure  . Coronary angioplasty with stent placement      "I had 9 stents before the OHS in 2007" (01/03/2014)  . Cataract extraction Left   . Refractive surgery Right   . Electrophysiology study  01/03/2014    EPS by Dr Ladona Ridgel with no inducible ventricular arrhythmias  . Lead revision  03-06-2014    LV lead revision by Dr Ladona Ridgel  . Temporary pacemaker insertion Bilateral 02/10/2012    Procedure: TEMPORARY PACEMAKER INSERTION;  Surgeon: Runell Gess, MD;  Location: [facility] CATH LAB;  Service: Cardiovascular;  Laterality: Bilateral;  . Left heart catheterization with coronary/graft angiogram  02/10/2012    Procedure: LEFT HEART CATHETERIZATION WITH Isabel Caprice;  Surgeon: Runell Gess, MD;  Location: [facility] CATH LAB;  Service: Cardiovascular;;  .  Permanent pacemaker insertion N/A 02/11/2012    Procedure: PERMANENT PACEMAKER INSERTION;  Surgeon: Thurmon Fair, MD;  Location: [facility] CATH LAB;  Service: Cardiovascular;  Laterality: N/A;  . Electrophysiology study N/A 01/03/2014    Procedure: ELECTROPHYSIOLOGY STUDY;  Surgeon: Marinus Maw, MD;  Location: [facility] CATH LAB;  Service: Cardiovascular;  Laterality: N/A;  . Bi-ventricular pacemaker revision N/A 03/06/2014    Procedure: BI-VENTRICULAR PACEMAKER REVISION (CRT-R);  Surgeon: Marinus Maw, MD;  Location: [facility] CATH LAB;  Service: Cardiovascular;  Laterality: N/A;    Current Medications: Outpatient Prescriptions Prior to Visit  Medication Sig Dispense Refill  . albuterol-ipratropium (COMBIVENT) 18-103 MCG/ACT inhaler Inhale 2 puffs into the lungs every 6 (six) hours as needed for wheezing or shortness of breath.    [name] aspirin EC 81 MG tablet Take 81 mg by mouth daily.    [name] atorvastatin (LIPITOR) 40 MG tablet Take 40 mg by mouth at bedtime.     . clopidogrel (PLAVIX) 75 MG tablet Take 1 tablet (75 mg total) by mouth daily. 90 tablet 0  . furosemide (LASIX) 20 MG tablet Take 1 tablet (20 mg total) by mouth daily. 90 tablet 0  .  glipiZIDE (GLUCOTROL XL) 5 MG 24 hr tablet Take 5 mg by mouth 2 (two) times daily.     . isosorbide mononitrate (IMDUR) 30 MG 24 hr tablet Take 1 tablet (30 mg total) by mouth daily. 90 tablet 0  . lisinopril (PRINIVIL,ZESTRIL) 2.5 MG tablet TAKE ONE TABLET BY MOUTH ONCE DAILY 90 tablet 0  . metFORMIN (GLUCOPHAGE) 1000 MG tablet Take 1 tablet (1,000 mg total) by mouth 2 (two) times daily with a meal. HOLD for 2 days. Restart 03/10/2014.    . metoprolol (LOPRESSOR) 100 MG tablet Take 1 tablet (100 mg total) by mouth 2 (two) times daily. 180 tablet 0  . nitroGLYCERIN (NITROSTAT) 0.4 MG SL tablet Place 1 tablet (0.4 mg total) under the tongue every 5 (five) minutes as needed for chest pain. 25 tablet 6  . pantoprazole (PROTONIX) 40 MG tablet Take 40 mg by mouth daily as needed  (acid reflux).      No facility-administered medications prior to visit.     Allergies:   Pollen extract   Social History   Social History  . Marital Status: Married    Spouse Name: N/A  . Number of Children: N/A  . Years of Education: N/A   Social History Main Topics  . Smoking status: Former Smoker -- 15.00 packs/day for 36 years    Types: Cigars, Cigarettes    Quit date: 05/25/2005  . Smokeless tobacco: Never Used     Comment: 15 cigars/daily when smoking  . Alcohol Use: Yes     Comment: "quit alcohol in 1985 or 1986"  . Drug Use: No  . Sexual Activity: Not Currently   Other Topics Concern  . None   Social History Narrative     Family History:  The patient's family history includes Cancer in his father.   ROS:   Please see the history of present illness.    ROS All other systems reviewed and are negative.   PHYSICAL EXAM:   VS:  BP 110/70 mmHg  Pulse 71  Ht  (1.778 m)  Wt 76.658 kg (169 lb)  BMI 24.25 kg/m2   GEN: Well nourished, well developed, in no acute distress HEENT: normalHe is very hoarse Neck: no JVD, carotid bruits, or masses Cardiac: Paradoxically split S2 RRR; no murmurs, rubs, or gallops,no edema normal pacemaker site; pedal pulses appear to be present in both feet but are very thready Respiratory:  clear to auscultation bilaterally, normal work of breathing GI: soft, nontender, nondistended, + BS MS: no deformity or atrophy Skin: warm and dry, no rash Neuro:  Alert and Oriented x 3, Strength and sensation are intact Psych: euthymic mood, full affect  Wt Readings from Last 3 Encounters:  03/04/16 76.658 kg (169 lb)  03/12/15 77.747 kg (171 lb 6.4 oz)  10/02/14 78.835 kg (173 lb 12.8 oz)      Studies/Labs Reviewed:   EKG:  EKG is ordered today.  The ekg ordered today demonstrates Atrial sensed ventricular paced rhythm with positive R waves in V1-V2 consistent with effective left ventricular pacing, QTC 478 ms  Recent Labs: No  results found for requested labs within last 365 days.   Lipid Panel    Component Value Date/Time   CHOL 135 10/16/2011 0400   TRIG 187* 10/16/2011 0400   HDL 34* 10/16/2011 0400   CHOLHDL 4.0 10/16/2011 0400   VLDL 37 10/16/2011 0400   LDLCALC 64 10/16/2011 0400    ASSESSMENT:    1. CHB (complete heart block) (HCC)  2. Chronic combined systolic and diastolic CHF, NYHA class 3 (HCC)   3. Essential hypertension   4. PAF (paroxysmal atrial fibrillation), history of PAF in past, in NSR on adm   5. Coronary artery disease involving coronary bypass graft of native heart without angina pectoris   6. Chronic obstructive pulmonary disease, unspecified COPD type (HCC)   7. Obstructive sleep apnea   8. Cardiac resynchronization therapy pacemaker (CRT-P) in place   9. Intermittent claudication (HCC)      PLAN:  In order of problems listed above:  1. CHB: Pacemaker dependent 2. CHF: Euvolemic by clinical exam, NYHA functional class II with impairment mostly related to lung problems  3. HTN: Well-controlled 4. AFib: No recurrence in the last few years, currently on aspirin and clopidogrel; resume anticoagulants if atrial fibrillation is detected by his pacemaker 5. CAD: Angina free, last cardiac catheterization in 2013. Known inferior scar by previous nuclear stress testing. No ischemia on last perfusion study. 6. COPD: No active wheezing today. He quit smoking 10 years ago or so. 7. CRT-P: Normal device function, continue downloads every 3 months 8. Possible PAD: Symptoms are atypical but he has very poor pedal pulses. Check lower extremity arterial Dopplers  I strongly encouraged him to seek ENT evaluation for his worsening hoarseness. His son, who never smoked, is currently receiving treatment for laryngeal cancer.  Medication Adjustments/Labs and Tests Ordered: Current medicines are reviewed at length with the patient today.  Concerns regarding medicines are outlined above.   Medication changes, Labs and Tests ordered today are listed in the Patient Instructions below. Patient Instructions  Medication Instructions: Dr Royann Shivers recommends that you continue on your current medications as directed. Please refer to the Current Medication list given to you today.  Labwork: NONE ORDERED  Testing/Procedures: 1. Lower Extremity Arterial doppler - Your physician has requested that you have a lower or upper extremity arterial duplex. This test is an ultrasound of the arteries in the legs or arms. It looks at arterial blood flow in the legs and arms. Allow one hour for Lower and Upper Arterial scans. There are no restrictions or special instructions.  2. Remote Pacemaker Download - Remote monitoring is used to monitor your Pacemaker of ICD from home. This monitoring reduces the number of office visits required to check your device to one time per year. It allows [place] to keep an eye on the functioning of your device to ensure it is working properly. You are scheduled for a device check from home on Friday, August 11th, 2017. You may send your transmission at any time that day. If you have a wireless device, the transmission will be sent automatically. After your physician reviews your transmission, you will receive a postcard with your next transmission date.  Follow-up: Dr Royann Shivers recommends that you schedule a follow-up appointment in 1 year with a pacemaker check. You will receive a reminder letter in the mail two months in advance. If you don't receive a letter, please call our office to schedule the follow-up appointment.  If you need a refill on your cardiac medications before your next appointment, please call your pharmacy.    Joie Bimler, MD  03/05/2016 12:43 PM    [facility] [address] [place], [place], [place]  [address] Phone: [phone]; Fax: [phone]

## 2016-03-06 LAB — CUP PACEART REMOTE DEVICE CHECK
Brady Statistic AP VP Percent: 3.11 %
Brady Statistic AP VS Percent: 0 %
Brady Statistic AS VP Percent: 96.83 %
Brady Statistic AS VS Percent: 0.06 %
Brady Statistic RV Percent Paced: 99.94 %
Implantable Lead Implant Date: 20130418
Implantable Lead Location: 753860
Implantable Lead Model: 5086
Lead Channel Impedance Value: 551 Ohm
Lead Channel Impedance Value: 589 Ohm
Lead Channel Impedance Value: 589 Ohm
Lead Channel Impedance Value: 703 Ohm
Lead Channel Impedance Value: 703 Ohm
Lead Channel Impedance Value: 969 Ohm
Lead Channel Pacing Threshold Pulse Width: 0.4 ms
Lead Channel Pacing Threshold Pulse Width: 0.4 ms
Lead Channel Sensing Intrinsic Amplitude: 4.625 mV
Lead Channel Setting Pacing Amplitude: 2 V
Lead Channel Setting Pacing Amplitude: 2.5 V
Lead Channel Setting Pacing Pulse Width: 0.4 ms
Lead Channel Setting Pacing Pulse Width: 0.8 ms
MDC IDC LEAD IMPLANT DT: 20130418
MDC IDC LEAD IMPLANT DT: 20150312
MDC IDC LEAD LOCATION: 753858
MDC IDC LEAD LOCATION: 753859
MDC IDC LEAD MODEL: 5086
MDC IDC MSMT BATTERY REMAINING LONGEVITY: 52 mo
MDC IDC MSMT BATTERY VOLTAGE: 3 V
MDC IDC MSMT LEADCHNL LV IMPEDANCE VALUE: 570 Ohm
MDC IDC MSMT LEADCHNL RA IMPEDANCE VALUE: 532 Ohm
MDC IDC MSMT LEADCHNL RA PACING THRESHOLD AMPLITUDE: 0.5 V
MDC IDC MSMT LEADCHNL RA SENSING INTR AMPL: 4.625 mV
MDC IDC MSMT LEADCHNL RV IMPEDANCE VALUE: 494 Ohm
MDC IDC MSMT LEADCHNL RV PACING THRESHOLD AMPLITUDE: 0.75 V
MDC IDC MSMT LEADCHNL RV SENSING INTR AMPL: 15.375 mV
MDC IDC MSMT LEADCHNL RV SENSING INTR AMPL: 15.375 mV
MDC IDC SESS DTM: 20170221023217
MDC IDC SET LEADCHNL RA PACING AMPLITUDE: 2 V
MDC IDC SET LEADCHNL RV SENSING SENSITIVITY: 5.6 mV
MDC IDC STAT BRADY RA PERCENT PACED: 3.11 %

## 2016-03-06 NOTE — Progress Notes (Signed)
Remote reviewed, findings within acceptable limits. No episodes.   Next follow up Mar 18, 2016

## 2016-03-14 LAB — CUP PACEART INCLINIC DEVICE CHECK
Battery Remaining Longevity: 51 mo
Battery Voltage: 3 V
Brady Statistic AP VP Percent: 3.29 %
Brady Statistic RA Percent Paced: 3.29 %
Brady Statistic RV Percent Paced: 99.94 %
Date Time Interrogation Session: 20170510180631
Implantable Lead Implant Date: 20130418
Implantable Lead Location: 753858
Implantable Lead Location: 753859
Implantable Lead Model: 5086
Lead Channel Impedance Value: 532 Ohm
Lead Channel Impedance Value: 589 Ohm
Lead Channel Impedance Value: 950 Ohm
Lead Channel Pacing Threshold Amplitude: 0.75 V
Lead Channel Pacing Threshold Pulse Width: 0.4 ms
Lead Channel Sensing Intrinsic Amplitude: 17.75 mV
Lead Channel Sensing Intrinsic Amplitude: 3.75 mV
Lead Channel Setting Pacing Pulse Width: 0.4 ms
MDC IDC LEAD IMPLANT DT: 20130418
MDC IDC LEAD IMPLANT DT: 20150312
MDC IDC LEAD LOCATION: 753860
MDC IDC LEAD MODEL: 5086
MDC IDC MSMT LEADCHNL LV IMPEDANCE VALUE: 570 Ohm
MDC IDC MSMT LEADCHNL LV IMPEDANCE VALUE: 570 Ohm
MDC IDC MSMT LEADCHNL LV IMPEDANCE VALUE: 665 Ohm
MDC IDC MSMT LEADCHNL LV IMPEDANCE VALUE: 684 Ohm
MDC IDC MSMT LEADCHNL RA PACING THRESHOLD AMPLITUDE: 0.5 V
MDC IDC MSMT LEADCHNL RA PACING THRESHOLD PULSEWIDTH: 0.4 ms
MDC IDC MSMT LEADCHNL RA SENSING INTR AMPL: 3.75 mV
MDC IDC MSMT LEADCHNL RV IMPEDANCE VALUE: 456 Ohm
MDC IDC MSMT LEADCHNL RV IMPEDANCE VALUE: 513 Ohm
MDC IDC MSMT LEADCHNL RV SENSING INTR AMPL: 17.75 mV
MDC IDC SET LEADCHNL LV PACING AMPLITUDE: 2 V
MDC IDC SET LEADCHNL LV PACING PULSEWIDTH: 0.8 ms
MDC IDC SET LEADCHNL RA PACING AMPLITUDE: 2 V
MDC IDC SET LEADCHNL RV PACING AMPLITUDE: 2.5 V
MDC IDC SET LEADCHNL RV SENSING SENSITIVITY: 5.6 mV
MDC IDC STAT BRADY AP VS PERCENT: 0 %
MDC IDC STAT BRADY AS VP PERCENT: 96.65 %
MDC IDC STAT BRADY AS VS PERCENT: 0.06 %

## 2016-03-17 ENCOUNTER — Ambulatory Visit (HOSPITAL_COMMUNITY)
Admission: RE | Admit: 2016-03-17 | Discharge: 2016-03-17 | Disposition: A | Discharge status: Home / Self Care | Payer: Medicare Other | Source: Ambulatory Visit | Attending: Cardiology | Admitting: Cardiology

## 2016-03-17 ENCOUNTER — Other Ambulatory Visit: Payer: Self-pay | Admitting: Cardiovascular Disease

## 2016-03-17 ENCOUNTER — Encounter: Payer: Medicare Other | Admitting: Cardiovascular Disease

## 2016-03-17 DIAGNOSIS — E785 Hyperlipidemia, unspecified: Secondary | ICD-10-CM | POA: Diagnosis not present

## 2016-03-17 DIAGNOSIS — I700 Atherosclerosis of aorta: Secondary | ICD-10-CM | POA: Diagnosis not present

## 2016-03-17 DIAGNOSIS — I10 Essential (primary) hypertension: Secondary | ICD-10-CM | POA: Insufficient documentation

## 2016-03-17 DIAGNOSIS — E119 Type 2 diabetes mellitus without complications: Secondary | ICD-10-CM | POA: Diagnosis not present

## 2016-03-17 DIAGNOSIS — I2510 Atherosclerotic heart disease of native coronary artery without angina pectoris: Secondary | ICD-10-CM | POA: Diagnosis not present

## 2016-03-17 DIAGNOSIS — I739 Peripheral vascular disease, unspecified: Secondary | ICD-10-CM | POA: Diagnosis present

## 2016-03-17 DIAGNOSIS — R0989 Other specified symptoms and signs involving the circulatory and respiratory systems: Secondary | ICD-10-CM | POA: Diagnosis not present

## 2016-03-17 DIAGNOSIS — I77811 Abdominal aortic ectasia: Secondary | ICD-10-CM | POA: Diagnosis not present

## 2016-03-17 DIAGNOSIS — I70203 Unspecified atherosclerosis of native arteries of extremities, bilateral legs: Secondary | ICD-10-CM | POA: Insufficient documentation

## 2016-03-24 ENCOUNTER — Ambulatory Visit: Payer: Medicare Other | Admitting: Emergency Medicine

## 2016-03-31 ENCOUNTER — Encounter: Payer: Self-pay | Admitting: Cardiovascular Disease

## 2016-03-31 ENCOUNTER — Ambulatory Visit (INDEPENDENT_AMBULATORY_CARE_PROVIDER_SITE_OTHER): Payer: Medicare Other | Admitting: Cardiovascular Disease

## 2016-03-31 VITALS — BP 127/85 | HR 68 | Ht 70.0 in | Wt 167.0 lb

## 2016-03-31 DIAGNOSIS — I739 Peripheral vascular disease, unspecified: Secondary | ICD-10-CM | POA: Diagnosis not present

## 2016-03-31 MED ORDER — GABAPENTIN 300 MG PO CAPS: 300.0000 mg | ORAL_CAPSULE | Freq: Two times a day (BID) | ORAL | Status: DC

## 2016-03-31 NOTE — Patient Instructions (Signed)
Medication Instructions:   START GABAPENTIN 300 MG TWICE DAILY  Follow-Up:  Your physician recommends that you schedule a follow-up appointment in: 3 MONTHS WITH DR Kirke Corin

## 2016-04-01 NOTE — Progress Notes (Signed)
Cardiology Office Note   Date:  04/01/2016   ID:  [name], DOB 1946/04/24, MRN [id number]  PCP:  Irving Copas, MD  Cardiologist: Dr. Royann Shivers  Chief Complaint  Patient presents with  . Advice Only      History of Present Illness: [name] is a 70 y.o. male who was referred by Dr. Royann Shivers for evaluation of mild peripheral arterial disease. He has multiple chronic medical conditions that include chronic systolic heart failure due to ischemic cardiomyopathy, coronary artery disease, complete heart block status post biventricular pacemaker, type 2 diabetes, hypertension, hyperlipidemia and previous tobacco use. The patient complains of aching in both legs which according to him is happening all the time mostly at rest and gets better occasionally if he walks or if he gets himself busy. He is usually able to perform activities of daily living without significant limitations but he seems to be more bothered by exertional dyspnea that by leg pain. He also describes cramping in both legs at night. He feels that the bottom of his feet get cold. He underwent noninvasive vascular evaluation which showed normal ABI bilaterally. Duplex showed moderate right external iliac and left common iliac artery stenosis, mild to moderate SFA disease bilaterally and two-vessel runoff below the knee. There was also a small abdominal aortic aneurysm. The patient has no lower extremity ulceration.   Past Medical History  Diagnosis Date  . Coronary artery disease   . Psoriasis   . Hypertension   . COPD (chronic obstructive pulmonary disease) (HCC)   . CHB (complete heart block) (HCC) 02/11/12    Medtronic permanent pacemaker  . DOE (dyspnea on exertion)     NYHA class 2-3  . Atrial tachycardia (HCC)   . Cardiomyopathy (HCC)     mixed ischemic and nonischemic  . Dyslipidemia   . LBBB (left bundle branch block)   . CHF (congestive heart failure) (HCC)   . Myocardial infarction (HCC)    "I've had 6" (01/03/2014)  . DVT (deep venous thrombosis) (HCC) 12/2005    RLE "had to have OR"  . Sleep apnea     c pap at home, does not use  . Type II diabetes mellitus (HCC)   . GERD (gastroesophageal reflux disease)     Past Surgical History  Procedure Laterality Date  . Coronary artery bypass graft  2007    x 3  . Cardiac catheterization  02/10/12    left CX: dominant w/40% prox. AV groove stenosis.The first & second marginal branches had stents visualized near the ostium of the vessel bu were functionally occluded. The remainder of the dominant CX had minor irregularities.  RCA: nondominant free of sign. disease. SVG to OM1 & OM2 sequentially was widely patent.  . US echocardiography  01/10/10    LV systolic fx mod to severely reduced, EF 35-40%, AOV mildly sclerotic  . Nm myocar perf wall motion  10/02/2009    no significant ischemia  . Pacemaker insertion  2013; 01/03/2014    MDT dual chamber pacemaker implanted 2013 for complete heart block; upgrade to CRTP (MDT) by Dr Ladona Ridgel 12/2013 due to cardiomyopathy and heart failure  . Coronary angioplasty with stent placement      "I had 9 stents before the OHS in 2007" (01/03/2014)  . Cataract extraction Left   . Refractive surgery Right   . Electrophysiology study  01/03/2014    EPS by Dr Ladona Ridgel with no inducible ventricular arrhythmias  . Lead revision  03-06-2014  LV lead revision by Dr Ladona Ridgel  . Temporary pacemaker insertion Bilateral 02/10/2012    Procedure: TEMPORARY PACEMAKER INSERTION;  Surgeon: Runell Gess, MD;  Location: [facility] CATH LAB;  Service: Cardiovascular;  Laterality: Bilateral;  . Left heart catheterization with coronary/graft angiogram  02/10/2012    Procedure: LEFT HEART CATHETERIZATION WITH Isabel Caprice;  Surgeon: Runell Gess, MD;  Location: [facility] CATH LAB;  Service: Cardiovascular;;  . Permanent pacemaker insertion N/A 02/11/2012    Procedure: PERMANENT PACEMAKER INSERTION;  Surgeon: Thurmon Fair, MD;   Location: [facility] CATH LAB;  Service: Cardiovascular;  Laterality: N/A;  . Electrophysiology study N/A 01/03/2014    Procedure: ELECTROPHYSIOLOGY STUDY;  Surgeon: Marinus Maw, MD;  Location: [facility] CATH LAB;  Service: Cardiovascular;  Laterality: N/A;  . Bi-ventricular pacemaker revision N/A 03/06/2014    Procedure: BI-VENTRICULAR PACEMAKER REVISION (CRT-R);  Surgeon: Marinus Maw, MD;  Location: [facility] CATH LAB;  Service: Cardiovascular;  Laterality: N/A;     Current Outpatient Prescriptions  Medication Sig Dispense Refill  . albuterol-ipratropium (COMBIVENT) 18-103 MCG/ACT inhaler Inhale 2 puffs into the lungs every 6 (six) hours as needed for wheezing or shortness of breath.    [name] aspirin EC 81 MG tablet Take 81 mg by mouth daily.    [name] atorvastatin (LIPITOR) 40 MG tablet Take 40 mg by mouth at bedtime.     . clopidogrel (PLAVIX) 75 MG tablet Take 1 tablet (75 mg total) by mouth daily. 90 tablet 0  . furosemide (LASIX) 20 MG tablet Take 1 tablet (20 mg total) by mouth daily. 90 tablet 0  . gabapentin (NEURONTIN) 300 MG capsule Take 1 capsule (300 mg total) by mouth 2 (two) times daily. 60 capsule 12  . isosorbide mononitrate (IMDUR) 30 MG 24 hr tablet Take 1 tablet (30 mg total) by mouth daily. 90 tablet 0  . lisinopril (PRINIVIL,ZESTRIL) 2.5 MG tablet TAKE ONE TABLET BY MOUTH ONCE DAILY 90 tablet 0  . metFORMIN (GLUCOPHAGE) 1000 MG tablet Take 1 tablet (1,000 mg total) by mouth 2 (two) times daily with a meal. HOLD for 2 days. Restart 03/10/2014.    . metoprolol (LOPRESSOR) 100 MG tablet Take 1 tablet (100 mg total) by mouth 2 (two) times daily. 180 tablet 0  . nitroGLYCERIN (NITROSTAT) 0.4 MG SL tablet Place 1 tablet (0.4 mg total) under the tongue every 5 (five) minutes as needed for chest pain. 25 tablet 6  . omeprazole (PRILOSEC) 40 MG capsule     . pantoprazole (PROTONIX) 40 MG tablet Take 40 mg by mouth daily as needed (acid reflux).      No current facility-administered medications for this  visit.    Allergies:   Pollen extract    Social History:  The patient  reports that he quit smoking about 10 years ago. His smoking use included Cigars and Cigarettes. He has a 540 pack-year smoking history. He has never used smokeless tobacco. He reports that he drinks alcohol. He reports that he does not use illicit drugs.   Family History:  The patient's family history includes Cancer in his father.    ROS:  Please see the history of present illness.   Otherwise, review of systems are positive for none.   All other systems are reviewed and negative.    PHYSICAL EXAM: VS:  BP 127/85 mmHg  Pulse 68  Ht 5\' 10"  (1.778 m)  Wt 167 lb (75.751 kg)  BMI 23.96 kg/m2 , BMI Body mass index is 23.96 kg/(m^2). GEN: Well nourished,  well developed, in no acute distress HEENT: normal Neck: no JVD, carotid bruits, or masses Cardiac: RRR; no murmurs, rubs, or gallops,no edema  Respiratory:  clear to auscultation bilaterally, normal work of breathing GI: soft, nontender, nondistended, + BS MS: no deformity or atrophy Skin: warm and dry, no rash Neuro:  Strength and sensation are intact Psych: euthymic mood, full affect Vascular: Femoral pulses are normal bilaterally. Posterior tibial and dorsalis pedis is +1 bilaterally.   EKG:  EKG is not ordered today.    Recent Labs: No results found for requested labs within last 365 days.    Lipid Panel    Component Value Date/Time   CHOL 135 10/16/2011 0400   TRIG 187* 10/16/2011 0400   HDL 34* 10/16/2011 0400   CHOLHDL 4.0 10/16/2011 0400   VLDL 37 10/16/2011 0400   LDLCALC 64 10/16/2011 0400      Wt Readings from Last 3 Encounters:  03/31/16 167 lb (75.751 kg)  03/04/16 169 lb (76.658 kg)  03/12/15 171 lb 6.4 oz (77.747 kg)        ASSESSMENT AND PLAN:  1.  Peripheral arterial disease: The patient does have diffuse peripheral arterial disease but it does not seem to be obstructive. His femoral pulses are normal and distal pulses  are palpable but slightly diminished. His symptoms are not consistent with claudication and suggestive more of peripheral neuropathy related to diabetes. Thus, I elected to start him on gabapentin as a trial. I advised him to stay active and I'm going to reevaluate his symptoms in 3 months to see if there is a need for angiography. He is on optimal therapy for atherosclerosis.  2. Chronic systolic heart failure: He appears to be euvolemic.  3. Coronary artery disease: He has significant exertional dyspnea without chest pain.    Disposition:   FU with me in 3 months  Signed,  Lorine Bears, MD  04/01/2016 2:36 PM    [facility]

## 2016-04-06 ENCOUNTER — Encounter: Payer: Self-pay | Admitting: Cardiovascular Disease

## 2016-04-26 ENCOUNTER — Other Ambulatory Visit: Payer: Self-pay | Admitting: Cardiovascular Disease

## 2016-06-05 ENCOUNTER — Encounter: Payer: Medicare Other | Admitting: *Deleted

## 2016-06-09 ENCOUNTER — Encounter: Payer: Self-pay | Admitting: Cardiology

## 2016-06-09 NOTE — Progress Notes (Signed)
Letter  

## 2016-06-12 ENCOUNTER — Ambulatory Visit (INDEPENDENT_AMBULATORY_CARE_PROVIDER_SITE_OTHER): Payer: Medicare Other | Admitting: *Deleted

## 2016-06-12 DIAGNOSIS — I442 Atrioventricular block, complete: Secondary | ICD-10-CM

## 2016-06-16 ENCOUNTER — Encounter: Payer: Self-pay | Admitting: Cardiology

## 2016-06-16 NOTE — Progress Notes (Signed)
Remote pacemaker transmission.   

## 2016-06-28 ENCOUNTER — Other Ambulatory Visit: Payer: Self-pay | Admitting: Cardiovascular Disease

## 2016-06-30 LAB — CUP PACEART REMOTE DEVICE CHECK
Battery Remaining Longevity: 51 mo
Battery Voltage: 3 V
Brady Statistic AP VP Percent: 5.24 %
Brady Statistic AS VS Percent: 0.02 %
Brady Statistic RA Percent Paced: 5.24 %
Date Time Interrogation Session: 20170819031445
Implantable Lead Implant Date: 20130418
Implantable Lead Implant Date: 20150312
Implantable Lead Location: 753858
Implantable Lead Location: 753859
Implantable Lead Model: 5086
Lead Channel Impedance Value: 532 Ohm
Lead Channel Impedance Value: 570 Ohm
Lead Channel Impedance Value: 589 Ohm
Lead Channel Impedance Value: 931 Ohm
Lead Channel Pacing Threshold Amplitude: 0.75 V
Lead Channel Pacing Threshold Pulse Width: 0.4 ms
Lead Channel Sensing Intrinsic Amplitude: 16.875 mV
Lead Channel Sensing Intrinsic Amplitude: 16.875 mV
Lead Channel Sensing Intrinsic Amplitude: 3.875 mV
Lead Channel Setting Pacing Amplitude: 2 V
MDC IDC LEAD IMPLANT DT: 20130418
MDC IDC LEAD LOCATION: 753860
MDC IDC LEAD MODEL: 5086
MDC IDC MSMT LEADCHNL LV IMPEDANCE VALUE: 532 Ohm
MDC IDC MSMT LEADCHNL LV IMPEDANCE VALUE: 627 Ohm
MDC IDC MSMT LEADCHNL LV IMPEDANCE VALUE: 684 Ohm
MDC IDC MSMT LEADCHNL RA PACING THRESHOLD AMPLITUDE: 0.5 V
MDC IDC MSMT LEADCHNL RA PACING THRESHOLD PULSEWIDTH: 0.4 ms
MDC IDC MSMT LEADCHNL RA SENSING INTR AMPL: 3.875 mV
MDC IDC MSMT LEADCHNL RV IMPEDANCE VALUE: 456 Ohm
MDC IDC MSMT LEADCHNL RV IMPEDANCE VALUE: 513 Ohm
MDC IDC SET LEADCHNL LV PACING PULSEWIDTH: 0.8 ms
MDC IDC SET LEADCHNL RA PACING AMPLITUDE: 2 V
MDC IDC SET LEADCHNL RV PACING AMPLITUDE: 2.5 V
MDC IDC SET LEADCHNL RV PACING PULSEWIDTH: 0.4 ms
MDC IDC SET LEADCHNL RV SENSING SENSITIVITY: 5.6 mV
MDC IDC STAT BRADY AP VS PERCENT: 0 %
MDC IDC STAT BRADY AS VP PERCENT: 94.73 %
MDC IDC STAT BRADY RV PERCENT PACED: 99.98 %

## 2016-06-30 NOTE — Telephone Encounter (Signed)
Rx(s) sent to pharmacy electronically.  

## 2016-07-07 ENCOUNTER — Encounter: Payer: Self-pay | Admitting: Cardiovascular Disease

## 2016-07-07 ENCOUNTER — Ambulatory Visit (INDEPENDENT_AMBULATORY_CARE_PROVIDER_SITE_OTHER): Payer: Medicare Other | Admitting: Cardiovascular Disease

## 2016-07-07 VITALS — BP 132/76 | HR 63 | Ht 70.0 in | Wt 167.2 lb

## 2016-07-07 DIAGNOSIS — I739 Peripheral vascular disease, unspecified: Secondary | ICD-10-CM

## 2016-07-07 DIAGNOSIS — I10 Essential (primary) hypertension: Secondary | ICD-10-CM | POA: Diagnosis not present

## 2016-07-07 DIAGNOSIS — E785 Hyperlipidemia, unspecified: Secondary | ICD-10-CM | POA: Diagnosis not present

## 2016-07-07 NOTE — Progress Notes (Signed)
Cardiology Office Note   Date:  07/07/2016   ID:  Wesley SaxJames L Walck, DOB 19-Mar-1946, MRN [id number]  PCP:  Irving CopasHACKER,ROBERT KELLER, MD  Cardiologist: Dr. Royann Shiversroitoru  Chief Complaint  Patient presents with  . Follow-up      History of Present Illness: [name] is a 70 y.o. male who Is here today for a follow-up visit regarding peripheral arterial disease and peripheral neuropathy.   He has multiple chronic medical conditions that include chronic systolic heart failure due to ischemic cardiomyopathy, coronary artery disease, complete heart block status post biventricular pacemaker, type 2 diabetes, hypertension, hyperlipidemia and previous tobacco use. Noninvasive vascular evaluation and management showed normal ABI bilaterally. Duplex showed moderate right external iliac and left common iliac artery stenosis, mild to moderate SFA disease bilaterally and two-vessel runoff below the knee. There was also a small abdominal aortic aneurysm.   His symptoms were mostly consistent with peripheral neuropathy. He was started on gabapentin. He also reports his atorvastatin was switched to rosuvastatin and since then he reports improvement in his symptoms. His legs feel better when he walks. He denies chest pain or shortness of breath. Unfortunately, his wife died suddenly recently and he has been depressed since then.   Past Medical History:  Diagnosis Date  . Atrial tachycardia (HCC)   . Cardiomyopathy (HCC)    mixed ischemic and nonischemic  . CHB (complete heart block) (HCC) 02/11/12   [employer] permanent pacemaker  . CHF (congestive heart failure) (HCC)   . COPD (chronic obstructive pulmonary disease) (HCC)   . Coronary artery disease   . DOE (dyspnea on exertion)    NYHA class 2-3  . DVT (deep venous thrombosis) (HCC) 12/2005   RLE "had to have OR"  . Dyslipidemia   . GERD (gastroesophageal reflux disease)   . Hypertension   . LBBB (left bundle branch block)   . Myocardial infarction  (HCC)    "I've had 6" (01/03/2014)  . Psoriasis   . Sleep apnea    c pap at home, does not use  . Type II diabetes mellitus (HCC)     Past Surgical History:  Procedure Laterality Date  . BI-VENTRICULAR PACEMAKER REVISION N/A 03/06/2014   Procedure: BI-VENTRICULAR PACEMAKER REVISION (CRT-R);  Surgeon: Marinus MawGregg W Taylor, MD;  [facility];  Service: Cardiovascular;  Laterality: N/A;  . CARDIAC CATHETERIZATION  02/10/12   left CX: dominant w/40% prox. AV groove stenosis.The first & second marginal branches had stents visualized near the ostium of the vessel bu were functionally occluded. The remainder of the dominant CX had minor irregularities.  RCA: nondominant free of sign. disease. SVG to OM1 & OM2 sequentially was widely patent.  [name]. CATARACT EXTRACTION Left   . CORONARY ANGIOPLASTY WITH STENT PLACEMENT     "I had 9 stents before the [facility] in 2007" (01/03/2014)  . CORONARY ARTERY BYPASS GRAFT  2007   x 3  . ELECTROPHYSIOLOGY STUDY  01/03/2014   EPS by Dr Ladona Ridgelaylor with no inducible ventricular arrhythmias  . ELECTROPHYSIOLOGY STUDY N/A 01/03/2014   Procedure: ELECTROPHYSIOLOGY STUDY;  Surgeon: Marinus MawGregg W Taylor, MD;  [facility];  Service: Cardiovascular;  Laterality: N/A;  . LEAD REVISION  03-06-2014   LV lead revision by Dr Ladona Ridgelaylor  . LEFT HEART CATHETERIZATION WITH CORONARY/GRAFT ANGIOGRAM  02/10/2012   Procedure: LEFT HEART CATHETERIZATION WITH Isabel CapriceORONARY/GRAFT ANGIOGRAM;  Surgeon: Runell GessJonathan J Berry, MD;  [facility];  Service: Cardiovascular;;  . NM MYOCAR PERF WALL MOTION  10/02/2009   no significant ischemia  . PACEMAKER INSERTION  2013; 01/03/2014   MDT dual chamber pacemaker implanted 2013 for complete heart block; upgrade to CRTP (MDT) by Dr Ladona Ridgel 12/2013 due to cardiomyopathy and heart failure  . PERMANENT PACEMAKER INSERTION N/A 02/11/2012   Procedure: PERMANENT PACEMAKER INSERTION;  Surgeon: Thurmon Fair, MD;  Location: [facility] CATH [facility];  Service: Cardiovascular;   Laterality: N/A;  . REFRACTIVE SURGERY Right   . TEMPORARY PACEMAKER INSERTION Bilateral 02/10/2012   Procedure: TEMPORARY PACEMAKER INSERTION;  Surgeon: Runell Gess, MD;  Location: [facility] CATH LAB;  Service: Cardiovascular;  Laterality: Bilateral;  . US ECHOCARDIOGRAPHY  01/10/10   LV systolic fx mod to severely reduced, EF 35-40%, AOV mildly sclerotic     Current Outpatient Prescriptions  Medication Sig Dispense Refill  . albuterol-ipratropium (COMBIVENT) 18-103 MCG/ACT inhaler Inhale 2 puffs into the lungs every 6 (six) hours as needed for wheezing or shortness of breath.    [name] aspirin EC 81 MG tablet Take 81 mg by mouth daily.    . clopidogrel (PLAVIX) 75 MG tablet Take 1 tablet by mouth  daily 90 tablet 2  . furosemide (LASIX) 20 MG tablet Take 1 tablet by mouth  daily 90 tablet 2  . gabapentin (NEURONTIN) 300 MG capsule Take 1 capsule (300 mg total) by mouth 2 (two) times daily. 60 capsule 12  . isosorbide mononitrate (IMDUR) 30 MG 24 hr tablet Take 1 tablet by mouth  daily 90 tablet 2  . lisinopril (PRINIVIL,ZESTRIL) 2.5 MG tablet Take 1 tablet by mouth  daily 90 tablet 2  . metFORMIN (GLUCOPHAGE) 1000 MG tablet Take 1 tablet (1,000 mg total) by mouth 2 (two) times daily with a meal. HOLD for 2 days. Restart 03/10/2014.    . metoprolol (LOPRESSOR) 100 MG tablet Take 1 tablet by mouth two  times daily 180 tablet 2  . nitroGLYCERIN (NITROSTAT) 0.4 MG SL tablet Place 1 tablet (0.4 mg total) under the tongue every 5 (five) minutes as needed for chest pain. 25 tablet 6  . pantoprazole (PROTONIX) 40 MG tablet Take 40 mg by mouth daily as needed (acid reflux).     . pioglitazone (ACTOS) 15 MG tablet Take 15 mg by mouth daily.    [name] glipiZIDE (GLUCOTROL XL) 5 MG 24 hr tablet Take 5 mg by mouth daily.    . rosuvastatin (CRESTOR) 10 MG tablet Take 10 mg by mouth daily.     No current facility-administered medications for this visit.     Allergies:   Pollen extract    Social History:  The  patient  reports that he quit smoking about 11 years ago. His smoking use included Cigars and Cigarettes. He has a 540.00 pack-year smoking history. He has never used smokeless tobacco. He reports that he drinks alcohol. He reports that he does not use drugs.   Family History:  The patient's family history includes Cancer in his father.    ROS:  Please see the history of present illness.   Otherwise, review of systems are positive for none.   All other systems are reviewed and negative.    PHYSICAL EXAM: VS:  BP 132/76 (BP Location: Right Arm, Patient Position: Sitting, Cuff Size: Normal)   Pulse 63   Ht 5\' 10"  (1.778 m)   Wt 167 lb 3.2 oz (75.8 kg)   SpO2 96%   BMI 23.99 kg/m  , BMI Body mass index is 23.99 kg/m. GEN: Well nourished, well developed, in no acute distress  HEENT: normal Neck: no JVD, carotid bruits, or masses Cardiac: RRR; no murmurs, rubs, or gallops,no edema  Respiratory:  clear to auscultation bilaterally, normal work of breathing GI: soft, nontender, nondistended, + BS MS: no deformity or atrophy Skin: warm and dry, no rash Neuro:  Strength and sensation are intact Psych: euthymic mood, full affect Vascular: Femoral pulses are normal bilaterally. Posterior tibial and dorsalis pedis is +1 bilaterally.   EKG:  EKG is not ordered today.    Recent Labs: No results found for requested labs within last 8760 hours.    Lipid Panel    Component Value Date/Time   CHOL 135 10/16/2011 0400   TRIG 187 (H) 10/16/2011 0400   HDL 34 (L) 10/16/2011 0400   CHOLHDL 4.0 10/16/2011 0400   VLDL 37 10/16/2011 0400   LDLCALC 64 10/16/2011 0400      Wt Readings from Last 3 Encounters:  07/07/16 167 lb 3.2 oz (75.8 kg)  03/31/16 167 lb (75.8 kg)  03/04/16 169 lb (76.7 kg)        ASSESSMENT AND PLAN:  1.  Peripheral arterial disease: The patient does have diffuse peripheral arterial disease but it does not seem to be obstructive.  Continue medical therapy. The  patient has a very small abdominal aortic aneurysm which will be followed with an ultrasound in May 2019.  2. Peripheral neuropathy: His symptoms improved with gabapentin and after switching atorvastatin to rosuvastatin.  3. Chronic systolic heart failure: He appears to be euvolemic.  3. Coronary artery disease: He has significant exertional dyspnea without chest pain.    Disposition:   FU with me in 12 months  Signed,  Lorine Bears, MD  07/07/2016 8:57 AM    [facility]

## 2016-07-07 NOTE — Patient Instructions (Signed)

## 2016-09-15 ENCOUNTER — Telehealth: Payer: Self-pay | Admitting: Cardiology

## 2016-09-15 ENCOUNTER — Ambulatory Visit (INDEPENDENT_AMBULATORY_CARE_PROVIDER_SITE_OTHER): Payer: Medicare Other | Admitting: *Deleted

## 2016-09-15 DIAGNOSIS — I442 Atrioventricular block, complete: Secondary | ICD-10-CM | POA: Diagnosis not present

## 2016-09-15 NOTE — Progress Notes (Signed)
Remote pacemaker transmission.   

## 2016-09-15 NOTE — Telephone Encounter (Signed)
Spoke with pt and reminded pt of remote transmission that is due today. Pt verbalized understanding.   

## 2016-09-24 ENCOUNTER — Encounter: Payer: Self-pay | Admitting: Cardiology

## 2016-09-30 LAB — CUP PACEART REMOTE DEVICE CHECK
Battery Remaining Longevity: 45 mo
Brady Statistic AS VS Percent: 0.02 %
Brady Statistic RV Percent Paced: 99.98 %
Date Time Interrogation Session: 20171121223614
Implantable Lead Implant Date: 20130418
Implantable Lead Implant Date: 20150312
Implantable Lead Model: 5086
Implantable Pulse Generator Implant Date: 20150311
Lead Channel Impedance Value: 551 Ohm
Lead Channel Impedance Value: 589 Ohm
Lead Channel Impedance Value: 627 Ohm
Lead Channel Pacing Threshold Pulse Width: 0.4 ms
Lead Channel Pacing Threshold Pulse Width: 0.4 ms
Lead Channel Sensing Intrinsic Amplitude: 21.125 mV
Lead Channel Sensing Intrinsic Amplitude: 21.125 mV
Lead Channel Setting Pacing Amplitude: 2 V
Lead Channel Setting Pacing Pulse Width: 0.4 ms
MDC IDC LEAD IMPLANT DT: 20130418
MDC IDC LEAD LOCATION: 753858
MDC IDC LEAD LOCATION: 753859
MDC IDC LEAD LOCATION: 753860
MDC IDC LEAD MODEL: 5086
MDC IDC MSMT BATTERY VOLTAGE: 2.99 V
MDC IDC MSMT LEADCHNL LV IMPEDANCE VALUE: 532 Ohm
MDC IDC MSMT LEADCHNL LV IMPEDANCE VALUE: 684 Ohm
MDC IDC MSMT LEADCHNL LV IMPEDANCE VALUE: 931 Ohm
MDC IDC MSMT LEADCHNL RA IMPEDANCE VALUE: 608 Ohm
MDC IDC MSMT LEADCHNL RA PACING THRESHOLD AMPLITUDE: 0.5 V
MDC IDC MSMT LEADCHNL RA SENSING INTR AMPL: 3.75 mV
MDC IDC MSMT LEADCHNL RA SENSING INTR AMPL: 3.75 mV
MDC IDC MSMT LEADCHNL RV IMPEDANCE VALUE: 494 Ohm
MDC IDC MSMT LEADCHNL RV IMPEDANCE VALUE: 551 Ohm
MDC IDC MSMT LEADCHNL RV PACING THRESHOLD AMPLITUDE: 0.875 V
MDC IDC SET LEADCHNL LV PACING PULSEWIDTH: 0.8 ms
MDC IDC SET LEADCHNL RA PACING AMPLITUDE: 2 V
MDC IDC SET LEADCHNL RV PACING AMPLITUDE: 2.5 V
MDC IDC SET LEADCHNL RV SENSING SENSITIVITY: 5.6 mV
MDC IDC STAT BRADY AP VP PERCENT: 3.75 %
MDC IDC STAT BRADY AP VS PERCENT: 0 %
MDC IDC STAT BRADY AS VP PERCENT: 96.23 %
MDC IDC STAT BRADY RA PERCENT PACED: 3.75 %

## 2016-12-15 ENCOUNTER — Ambulatory Visit (INDEPENDENT_AMBULATORY_CARE_PROVIDER_SITE_OTHER): Payer: Medicare Other | Admitting: *Deleted

## 2016-12-15 ENCOUNTER — Telehealth: Payer: Self-pay | Admitting: Cardiology

## 2016-12-15 DIAGNOSIS — I442 Atrioventricular block, complete: Secondary | ICD-10-CM | POA: Diagnosis not present

## 2016-12-15 NOTE — Telephone Encounter (Signed)
LMOVM reminding pt to send remote transmission.   

## 2016-12-16 ENCOUNTER — Encounter: Payer: Self-pay | Admitting: Cardiology

## 2016-12-16 LAB — CUP PACEART REMOTE DEVICE CHECK
Battery Remaining Longevity: 46 mo
Brady Statistic AP VS Percent: 0 %
Brady Statistic AS VS Percent: 0.01 %
Implantable Lead Implant Date: 20130418
Implantable Lead Location: 753858
Implantable Lead Location: 753859
Implantable Pulse Generator Implant Date: 20150311
Lead Channel Impedance Value: 532 Ohm
Lead Channel Impedance Value: 551 Ohm
Lead Channel Impedance Value: 608 Ohm
Lead Channel Impedance Value: 969 Ohm
Lead Channel Pacing Threshold Pulse Width: 0.4 ms
Lead Channel Sensing Intrinsic Amplitude: 21.125 mV
Lead Channel Sensing Intrinsic Amplitude: 21.125 mV
Lead Channel Sensing Intrinsic Amplitude: 4 mV
Lead Channel Sensing Intrinsic Amplitude: 4 mV
Lead Channel Setting Pacing Amplitude: 2.5 V
Lead Channel Setting Pacing Pulse Width: 0.4 ms
Lead Channel Setting Sensing Sensitivity: 5.6 mV
MDC IDC LEAD IMPLANT DT: 20130418
MDC IDC LEAD IMPLANT DT: 20150312
MDC IDC LEAD LOCATION: 753860
MDC IDC MSMT BATTERY VOLTAGE: 2.99 V
MDC IDC MSMT LEADCHNL LV IMPEDANCE VALUE: 646 Ohm
MDC IDC MSMT LEADCHNL LV IMPEDANCE VALUE: 703 Ohm
MDC IDC MSMT LEADCHNL RA IMPEDANCE VALUE: 570 Ohm
MDC IDC MSMT LEADCHNL RA IMPEDANCE VALUE: 608 Ohm
MDC IDC MSMT LEADCHNL RA PACING THRESHOLD AMPLITUDE: 0.5 V
MDC IDC MSMT LEADCHNL RV IMPEDANCE VALUE: 475 Ohm
MDC IDC MSMT LEADCHNL RV PACING THRESHOLD AMPLITUDE: 0.75 V
MDC IDC MSMT LEADCHNL RV PACING THRESHOLD PULSEWIDTH: 0.4 ms
MDC IDC SESS DTM: 20180221044527
MDC IDC SET LEADCHNL LV PACING AMPLITUDE: 2 V
MDC IDC SET LEADCHNL LV PACING PULSEWIDTH: 0.8 ms
MDC IDC SET LEADCHNL RA PACING AMPLITUDE: 2 V
MDC IDC STAT BRADY AP VP PERCENT: 3.9 %
MDC IDC STAT BRADY AS VP PERCENT: 96.09 %
MDC IDC STAT BRADY RA PERCENT PACED: 3.89 %
MDC IDC STAT BRADY RV PERCENT PACED: 99.97 %

## 2016-12-16 NOTE — Progress Notes (Signed)
Remote pacemaker transmission.   

## 2017-03-06 ENCOUNTER — Other Ambulatory Visit: Payer: Self-pay | Admitting: Cardiovascular Disease

## 2017-03-08 NOTE — Telephone Encounter (Signed)
Rx(s) sent to pharmacy electronically.  

## 2017-03-21 DIAGNOSIS — I714 Abdominal aortic aneurysm, without rupture, unspecified: Secondary | ICD-10-CM | POA: Insufficient documentation

## 2017-03-21 DIAGNOSIS — I739 Peripheral vascular disease, unspecified: Secondary | ICD-10-CM | POA: Insufficient documentation

## 2017-03-21 NOTE — Progress Notes (Signed)
Patient ID: [name], male   DOB: 01/20/1946, 71 y.o.   MRN: [id number]    Cardiology Office Note    Date:  03/24/2017   ID:  [name], DOB 08/07/1946, MRN [id number]  PCP:  Wesley Screws, MD  Cardiologist:  Wesley Bunting, MD:  Wesley Fair, MD   Chief Complaint  Patient presents with  . Follow-up    History of Present Illness:  [name] is a 71 y.o. male with ischemic cardiomyopathy, chronic systolic and diastolic heart failure, complete heart block and a biventricular pacemaker. He also has advanced chronic obstructive lung disease related to long-standing previous smoking. COPD has been shown by cardiopulmonary stress testing to be major cause of his exertional dyspnea, although there are some cardio circulatory components as well. He also has type 2 diabetes mellitus on oral antidiabetics and a remote history of smoking.  [name] wife of 31 years passed away last May 15, 2023. He immediately becomes tearful when he mentions her. She still visits the cemetery and talks to her twice a week. He has not removed any of the reminders from the home and bathroom and plans to keep on living in the same house. He finds a lot of solace in taking care of his Dachsund. He takes her everywhere and therefore limits entering into commercial establishments or restaurants because he does not want to leave her in the car. He feels very lonely when he is separated from his dog. Even coming to the office today was a hardship, since he could hear the dog crying at home.  His pacemaker showed marked and sustained worsening of thoracic impedance last 15-May-2023 and August, probably related to his wife's passing, lower compliance with medicines and salt restriction. He has a remote monitored weight scale and is followed by heart failure nurse. When he overindulged in sausage recently he noticed that his weight increased 265 pounds and he developed some worsening shortness of breath. After curtailing sodium intake he  quickly returned to his baseline which on his home scales 161 pounds (7 pounds less than our office scale records). Roughly year ago he was started on treatment with Actos 50 mg daily in addition to glyburide and metformin.  The patient specifically denies any chest pain at rest or exertion, orthopnea, paroxysmal nocturnal dyspnea, syncope, palpitations, focal neurological deficits, lower extremity edema, unexplained weight gain, cough, hemoptysis or wheezing. Typically he has NYHA functional class II exertional dyspnea, most obvious when the weather is warm. Usually no dyspnea while walking on level ground, unless he has to her eat. Continues to have atypical neuropathic symptoms in both feet, without classic intermittent claudication.  After last year his lower extremity arterial Dopplers show significant disease bilaterally he had a consultation with [name]. Recommended monitoring and continued medical therapy. Repeat ultrasound for his very small AAA is due now.   His other complaint is hoarseness, but this has not changed since his last appointment.. He has intermittent problems with psoriasis flares, but prefers not to use topical steroids or systemic therapies. He simply manages with scrubbing so he doesn't have excessive scales and then uses Vaseline on the larger lesions on his elbows and abdomen.  Pacemaker interrogation shows normal device function. He is pacemaker dependent due to complete heart block. His Medtronic consult the device was implanted in 2015 and has roughly 4 more years of generator longevity. He has 99.9 % biventricular pacing as well as for % atrial pacing. Lead parameters are unchanged. No significant  episodes of atrial fibrillation or ventricular tachycardia seen on the current check.  He has a long-standing history of coronary disease and underwent bypass surgery in 2007. He had left bundle branch block for years and in 2013 received a dual-chamber permanent pacemaker for  complete heart block Just before getting his pacemaker he underwent cardiac catheterization which showed a patent sequential saphenous vein graft bypass to the first and second oblique marginal arteries (both of these vessels had previously been stented and had severe in-stent restenosis).  He has no underlying escape rhythm today and is 100% paced. He requires relatively high doses of beta blockers for control of recurrent paroxysmal atrial tachycardia which was quite symptomatic. Left ventricular ejection fraction had been 45-50% prior to pacemaker implantation. Following implantation of his pacemaker his echocardiogram showed moderately depressed left ventricular systolic function with an ejection fraction of 35-40%. He improved following biventricular pacemaker upgrade, but then had lead dislodgment and deteriorated again. After repositioning of his left ventricular lead, left ventricular ejection fraction increased to 50-55 %- by his most recent echocardiogram in December 2015.  He has no underlying escape rhythm today and is 100% paced.  Attempts at treatment with ACE inhibitor in the past led to severe symptomatic hypotension. He now seems to be able to tolerate them in very low doses only.  Past Medical History:  Diagnosis Date  . Atrial tachycardia (HCC)   . Cardiomyopathy (HCC)    mixed ischemic and nonischemic  . CHB (complete heart block) (HCC) 02/11/12   Medtronic permanent pacemaker  . CHF (congestive heart failure) (HCC)   . COPD (chronic obstructive pulmonary disease) (HCC)   . Coronary artery disease   . DOE (dyspnea on exertion)    NYHA class 2-3  . DVT (deep venous thrombosis) (HCC) 12/2005   RLE "had to have OR"  . Dyslipidemia   . GERD (gastroesophageal reflux disease)   . Hypertension   . LBBB (left bundle branch block)   . Myocardial infarction (HCC)    "I've had 6" (01/03/2014)  . Psoriasis   . Sleep apnea    c pap at home, does not use  . Type II diabetes  mellitus (HCC)     Past Surgical History:  Procedure Laterality Date  . BI-VENTRICULAR PACEMAKER REVISION N/A 03/06/2014   Procedure: BI-VENTRICULAR PACEMAKER REVISION (CRT-R);  Surgeon: Marinus Maw, MD;  Location: [facility] CATH LAB;  Service: Cardiovascular;  Laterality: N/A;  . CARDIAC CATHETERIZATION  02/10/12   left CX: dominant w/40% prox. AV groove stenosis.The first & second marginal branches had stents visualized near the ostium of the vessel bu were functionally occluded. The remainder of the dominant CX had minor irregularities.  RCA: nondominant free of sign. disease. SVG to OM1 & OM2 sequentially was widely patent.  [name] CATARACT EXTRACTION Left   . CORONARY ANGIOPLASTY WITH STENT PLACEMENT     "I had 9 stents before the [facility] in 2007" (01/03/2014)  . CORONARY ARTERY BYPASS GRAFT  2007   x 3  . ELECTROPHYSIOLOGY STUDY  01/03/2014   EPS by Dr Ladona Ridgel with no inducible ventricular arrhythmias  . ELECTROPHYSIOLOGY STUDY N/A 01/03/2014   Procedure: ELECTROPHYSIOLOGY STUDY;  Surgeon: Marinus Maw, MD;  Location: [facility] CATH LAB;  Service: Cardiovascular;  Laterality: N/A;  . LEAD REVISION  03-06-2014   LV lead revision by Dr Ladona Ridgel  . LEFT HEART CATHETERIZATION WITH CORONARY/GRAFT ANGIOGRAM  02/10/2012   Procedure: LEFT HEART CATHETERIZATION WITH Isabel Caprice;  Surgeon: Runell Gess, MD;  Location: [facility] CATH LAB;  Service: Cardiovascular;;  . NM MYOCAR PERF WALL MOTION  10/02/2009   no significant ischemia  . PACEMAKER INSERTION  2013; 01/03/2014   MDT dual chamber pacemaker implanted 2013 for complete heart block; upgrade to CRTP (MDT) by Dr Ladona Ridgel 12/2013 due to cardiomyopathy and heart failure  . PERMANENT PACEMAKER INSERTION N/A 02/11/2012   Procedure: PERMANENT PACEMAKER INSERTION;  Surgeon: Wesley Fair, MD;  Location: [facility] CATH LAB;  Service: Cardiovascular;  Laterality: N/A;  . REFRACTIVE SURGERY Right   . TEMPORARY PACEMAKER INSERTION Bilateral 02/10/2012   Procedure: TEMPORARY  PACEMAKER INSERTION;  Surgeon: Runell Gess, MD;  Location: [facility] CATH LAB;  Service: Cardiovascular;  Laterality: Bilateral;  . US ECHOCARDIOGRAPHY  01/10/10   LV systolic fx mod to severely reduced, EF 35-40%, AOV mildly sclerotic    Current Medications: Outpatient Medications Prior to Visit  Medication Sig Dispense Refill  . albuterol-ipratropium (COMBIVENT) 18-103 MCG/ACT inhaler Inhale 2 puffs into the lungs every 6 (six) hours as needed for wheezing or shortness of breath.    [name] aspirin EC 81 MG tablet Take 81 mg by mouth daily.    . clopidogrel (PLAVIX) 75 MG tablet TAKE 1 TABLET BY MOUTH  DAILY 90 tablet 2  . furosemide (LASIX) 20 MG tablet TAKE 1 TABLET BY MOUTH  DAILY 90 tablet 2  . gabapentin (NEURONTIN) 300 MG capsule Take 1 capsule (300 mg total) by mouth 2 (two) times daily. 60 capsule 12  . glipiZIDE (GLUCOTROL XL) 5 MG 24 hr tablet Take 5 mg by mouth daily.    . isosorbide mononitrate (IMDUR) 30 MG 24 hr tablet TAKE 1 TABLET BY MOUTH  DAILY 90 tablet 2  . lisinopril (PRINIVIL,ZESTRIL) 2.5 MG tablet TAKE 1 TABLET BY MOUTH  DAILY 90 tablet 2  . metFORMIN (GLUCOPHAGE) 1000 MG tablet Take 1 tablet (1,000 mg total) by mouth 2 (two) times daily with a meal. HOLD for 2 days. Restart 03/10/2014.    . metoprolol (LOPRESSOR) 100 MG tablet TAKE 1 TABLET BY MOUTH TWO  TIMES DAILY 180 tablet 2  . nitroGLYCERIN (NITROSTAT) 0.4 MG SL tablet Place 1 tablet (0.4 mg total) under the tongue every 5 (five) minutes as needed for chest pain. 25 tablet 6  . pantoprazole (PROTONIX) 40 MG tablet Take 40 mg by mouth daily as needed (acid reflux).     . pioglitazone (ACTOS) 15 MG tablet Take 15 mg by mouth daily.    . rosuvastatin (CRESTOR) 10 MG tablet Take 10 mg by mouth daily.     No facility-administered medications prior to visit.      Allergies:   Pollen extract   Social History   Social History  . Marital status: Married    Spouse name: N/A  . Number of children: N/A  . Years of  education: N/A   Social History Main Topics  . Smoking status: Former Smoker    Packs/day: 15.00    Years: 36.00    Types: Cigars, Cigarettes    Quit date: 05/25/2005  . Smokeless tobacco: Never Used     Comment: 15 cigars/daily when smoking  . Alcohol use 0.0 oz/week     Comment: "quit alcohol in 1985 or 1986"  . Drug use: No  . Sexual activity: Not Currently   Other Topics Concern  . None   Social History Narrative  . None     Family History:  The patient's family history includes Cancer in his father.   ROS:  Please see the history of present illness.    ROS All other systems reviewed and are negative.   PHYSICAL EXAM:   VS:  BP 110/60   Pulse 67   Ht 5\' 10"  (1.778 m)   Wt 168 lb (76.2 kg)   BMI 24.11 kg/m    GEN: Well nourished, well developed, in no acute distress  HEENT: normal He is very hoarse Neck: no JVD, carotid bruits, or masses Cardiac: Paradoxically split S2 RRR; no murmurs, rubs, or gallops,no edema normal pacemaker site; pedal pulses appear to be present in both feet but are very thready Respiratory:  clear to auscultation bilaterally, normal work of breathing GI: soft, nontender, nondistended, + BS MS: no deformity or atrophy  Skin: warm and dry, no rash Neuro:  Alert and Oriented x 3, Strength and sensation are intact Psych: euthymic mood, full affect  Wt Readings from Last 3 Encounters:  03/24/17 168 lb (76.2 kg)  07/07/16 167 lb 3.2 oz (75.8 kg)  03/31/16 167 lb (75.8 kg)      Studies/Labs Reviewed:   EKG:  EKG is ordered today.  The ekg ordered today demonstrates Atrial sensed ventricular paced rhythm with positive R waves in V1-V2 consistent with effective left ventricular pacing, QTC 478 ms  Recent Labs:  December 01 2016  Potassium 4.8, creatinine 0.88, A1c 9.1%, normal liver function tests, total cholesterol 146, HDL 34, LDL 51, triglycerides 305  Lipid Panel    Component Value Date/Time   CHOL 135 10/16/2011 0400   TRIG 187  (H) 10/16/2011 0400   HDL 34 (L) 10/16/2011 0400   CHOLHDL 4.0 10/16/2011 0400   VLDL 37 10/16/2011 0400   LDLCALC 64 10/16/2011 0400    ASSESSMENT:    1. CHB (complete heart block) (HCC)   2. Chronic combined systolic and diastolic CHF, NYHA class 3 (HCC)   3. Essential hypertension   4. PAF (paroxysmal atrial fibrillation), history of PAF in past, in NSR on adm   5. Coronary artery disease involving coronary bypass graft of native heart without angina pectoris   6. Chronic obstructive pulmonary disease, unspecified COPD type (HCC)   7. Cardiac resynchronization therapy pacemaker (CRT-P) in place   8. Dyslipidemia   9. Type 2 diabetes mellitus with complication, without long-term current use of insulin (HCC)   10. PAD (peripheral artery disease) (HCC)   11. Abdominal aortic aneurysm (AAA) without rupture (HCC)   12. Grief reaction with prolonged bereavement      PLAN:  In order of problems listed above:  1. CHB: Pacemaker dependent.  2. CHF: Euvolemic by clinical exam, NYHA functional class II with impairment mostly related to lung problems. However, sodium indiscretion led to some worsening of the shortness of breath, resolved at this time. His blood pressure does not permit additional titration of heart failure medications. 3. HTN: Well-controlled 4. AFib: No recurrence in the last several years, currently on aspirin and clopidogrel; resume anticoagulants if atrial fibrillation is detected by his pacemaker. 5. CAD: Angina free, last cardiac catheterization in 2013. Known inferior scar by previous nuclear stress testing. No ischemia on last perfusion study. 6. COPD: No active wheezing today. He quit smoking 10 years ago or so. 7. CRT-P: Normal device function, continue downloads every 3 months. Device followed by Dr. Lewayne Ray 8. HLP: Recent labs show satisfactory LDL on rosuvastatin . The elevated triglycerides correlate with poorly controlled diabetes. 9. DM, complicated by  PAD: Glycemic control is inadequate. At least in part this is  due to the fact that he is very sedentary. Discussed improving her exercise level and diet. Avoid increasing the dose of Actos since this could lead to heart failure decompensation. In fact, if heart failure symptoms begin to bother him I would recommend discontinuing this medication and finding an alternative treatment. 10. PAD: Does not have intermittent claudication. Last May had lower extremity arterial Dopplers showing >50% right external and left common iliac artery stenosis. 30-49% right SFA disease, without focal stenosis. 50-74% left distal SFA stenosis. Occluded bilateral anterior tibial artery disease, two vessel run-off, bilaterally. Bilateral ABI 1.1-1.2 11. AAA: Infrarenal fusiform dilation of the distal aorta measuring 2.2 cm x 2.3 cm, stable. On beta blocker. 12. Grief: I know whether or not he is developing depression. Soon enough it'll be a year since his wife passed away and his grief response is a little prolonged. He also seems to be isolating himself, although his family checks on him frequently. His relationship with his pet dog is important to him and I think he should be able to take her around as an emotional support animal. Need to watch carefully for development of clear signs of depression. He may need treatment for this.     Medication Adjustments/Labs and Tests Ordered: Current medicines are reviewed at length with the patient today.  Concerns regarding medicines are outlined above.  Medication changes, Labs and Tests ordered today are listed in the Patient Instructions below. Patient Instructions  Dr Royann Shivers recommends that you continue on your current medications as directed. Please refer to the Current Medication list given to you today.  Remote monitoring is used to monitor your Pacemaker or ICD from home. This monitoring reduces the number of office visits required to check your device to one time per  year. It allows [place] to keep an eye on the functioning of your device to ensure it is working properly. You are scheduled for a device check from home on Wednesday, August 29th, 2018. You may send your transmission at any time that day. If you have a wireless device, the transmission will be sent automatically. After your physician reviews your transmission, you will receive a notification with your next transmission date.  Dr Royann Shivers recommends that you schedule a follow-up appointment in 12 months with a pacemaker check. You will receive a reminder letter in the mail two months in advance. If you don't receive a letter, please call our office to schedule the follow-up appointment.  If you need a refill on your cardiac medications before your next appointment, please call your pharmacy.    Signed, Wesley Fair, MD  03/24/2017 2:40 PM    [facility] [address] [place], [place], [place]  [address] Phone: [phone]; Fax: [phone]

## 2017-03-24 ENCOUNTER — Encounter: Payer: Self-pay | Admitting: Cardiovascular Disease

## 2017-03-24 ENCOUNTER — Telehealth: Payer: Self-pay | Admitting: Cardiovascular Disease

## 2017-03-24 ENCOUNTER — Ambulatory Visit (INDEPENDENT_AMBULATORY_CARE_PROVIDER_SITE_OTHER): Payer: Medicare Other | Admitting: Cardiovascular Disease

## 2017-03-24 ENCOUNTER — Telehealth: Payer: Self-pay | Admitting: *Deleted

## 2017-03-24 VITALS — BP 110/60 | HR 67 | Ht 70.0 in | Wt 168.0 lb

## 2017-03-24 DIAGNOSIS — I442 Atrioventricular block, complete: Secondary | ICD-10-CM | POA: Diagnosis not present

## 2017-03-24 DIAGNOSIS — I5022 Chronic systolic (congestive) heart failure: Secondary | ICD-10-CM

## 2017-03-24 DIAGNOSIS — I10 Essential (primary) hypertension: Secondary | ICD-10-CM | POA: Diagnosis not present

## 2017-03-24 DIAGNOSIS — Z950 Presence of cardiac pacemaker: Secondary | ICD-10-CM | POA: Diagnosis not present

## 2017-03-24 DIAGNOSIS — I5042 Chronic combined systolic (congestive) and diastolic (congestive) heart failure: Secondary | ICD-10-CM | POA: Diagnosis not present

## 2017-03-24 DIAGNOSIS — J449 Chronic obstructive pulmonary disease, unspecified: Secondary | ICD-10-CM

## 2017-03-24 DIAGNOSIS — I714 Abdominal aortic aneurysm, without rupture, unspecified: Secondary | ICD-10-CM

## 2017-03-24 DIAGNOSIS — E118 Type 2 diabetes mellitus with unspecified complications: Secondary | ICD-10-CM | POA: Diagnosis not present

## 2017-03-24 DIAGNOSIS — E785 Hyperlipidemia, unspecified: Secondary | ICD-10-CM | POA: Diagnosis not present

## 2017-03-24 DIAGNOSIS — I480 Paroxysmal atrial fibrillation: Secondary | ICD-10-CM | POA: Diagnosis not present

## 2017-03-24 DIAGNOSIS — F4321 Adjustment disorder with depressed mood: Secondary | ICD-10-CM | POA: Diagnosis not present

## 2017-03-24 DIAGNOSIS — I25810 Atherosclerosis of coronary artery bypass graft(s) without angina pectoris: Secondary | ICD-10-CM | POA: Diagnosis not present

## 2017-03-24 DIAGNOSIS — I739 Peripheral vascular disease, unspecified: Secondary | ICD-10-CM | POA: Diagnosis not present

## 2017-03-24 DIAGNOSIS — F4381 Prolonged grief disorder: Secondary | ICD-10-CM

## 2017-03-24 LAB — CUP PACEART INCLINIC DEVICE CHECK
Battery Remaining Longevity: 48 mo
Battery Voltage: 2.99 V
Brady Statistic AP VS Percent: 0 %
Implantable Lead Implant Date: 20130418
Implantable Lead Implant Date: 20150312
Implantable Lead Location: 753858
Implantable Lead Location: 753859
Implantable Pulse Generator Implant Date: 20150311
Lead Channel Impedance Value: 570 Ohm
Lead Channel Impedance Value: 627 Ohm
Lead Channel Pacing Threshold Amplitude: 0.625 V
Lead Channel Pacing Threshold Pulse Width: 0.4 ms
Lead Channel Pacing Threshold Pulse Width: 0.8 ms
Lead Channel Sensing Intrinsic Amplitude: 21.125 mV
Lead Channel Sensing Intrinsic Amplitude: 3.5 mV
Lead Channel Setting Pacing Amplitude: 2 V
Lead Channel Setting Pacing Amplitude: 2 V
MDC IDC LEAD IMPLANT DT: 20130418
MDC IDC LEAD LOCATION: 753860
MDC IDC MSMT LEADCHNL LV IMPEDANCE VALUE: 532 Ohm
MDC IDC MSMT LEADCHNL LV IMPEDANCE VALUE: 608 Ohm
MDC IDC MSMT LEADCHNL LV IMPEDANCE VALUE: 646 Ohm
MDC IDC MSMT LEADCHNL LV IMPEDANCE VALUE: 722 Ohm
MDC IDC MSMT LEADCHNL LV IMPEDANCE VALUE: 950 Ohm
MDC IDC MSMT LEADCHNL LV PACING THRESHOLD AMPLITUDE: 1.5 V — AB
MDC IDC MSMT LEADCHNL RA PACING THRESHOLD AMPLITUDE: 0.5 V
MDC IDC MSMT LEADCHNL RA PACING THRESHOLD PULSEWIDTH: 0.4 ms
MDC IDC MSMT LEADCHNL RA SENSING INTR AMPL: 2.75 mV
MDC IDC MSMT LEADCHNL RV IMPEDANCE VALUE: 494 Ohm
MDC IDC MSMT LEADCHNL RV IMPEDANCE VALUE: 551 Ohm
MDC IDC MSMT LEADCHNL RV SENSING INTR AMPL: 21.125 mV
MDC IDC SESS DTM: 20180530161931
MDC IDC SET LEADCHNL LV PACING PULSEWIDTH: 0.8 ms
MDC IDC SET LEADCHNL RV PACING AMPLITUDE: 2.5 V
MDC IDC SET LEADCHNL RV PACING PULSEWIDTH: 0.4 ms
MDC IDC SET LEADCHNL RV SENSING SENSITIVITY: 5.6 mV
MDC IDC STAT BRADY AP VP PERCENT: 4.34 %
MDC IDC STAT BRADY AS VP PERCENT: 95.63 %
MDC IDC STAT BRADY AS VS PERCENT: 0.03 %
MDC IDC STAT BRADY RA PERCENT PACED: 4.34 %
MDC IDC STAT BRADY RV PERCENT PACED: 99.92 %

## 2017-03-24 NOTE — Telephone Encounter (Signed)
Per the patient, his daughter, Erskine Speed, has been added to the [facility] form. He filled out the new paperwork.  She stated that since the death of his wife he has been experiencing emotional grief and is only relived somewhat with his dog. He would like to be able to take the dog with him when he goes out but some places don't accept animals. She would like to surprise him and make the dog an official "emotional support" dog but would need a letter from the provider.  It would need to state that it would be: -an emotional support dog -due to the loss of his wife  If the letter can be done it needs to be faxed to [phone] with attention Jinnie. Will route to the provider.

## 2017-03-24 NOTE — Telephone Encounter (Signed)
New Message   pt daughter verbalized that she is calling for rn  She wants to secretly see if Dr.c will approve for her fathers dog to be an emotional support dog legally   To do so a physicians approval is required. Ever since pt wife passed last year in July the pt wont go anywhere without the dog.   Please call daughter

## 2017-03-24 NOTE — Telephone Encounter (Signed)
Returned call back to the patient's daughter, no answer. She is not listed on the DPR.

## 2017-03-24 NOTE — Telephone Encounter (Signed)
Patient daughter returning call. Thanks.

## 2017-03-24 NOTE — Telephone Encounter (Signed)
Dachshund letter has been faxed to the daughter

## 2017-03-24 NOTE — Patient Instructions (Signed)
Dr Croitoru recommends that you continue on your current medications as directed. Please refer to the Current Medication list given to you today.  Remote monitoring is used to monitor your Pacemaker or ICD from home. This monitoring reduces the number of office visits required to check your device to one time per year. It allows us to keep an eye on the functioning of your device to ensure it is working properly. You are scheduled for a device check from home on Wednesday, August 29th, 2018. You may send your transmission at any time that day. If you have a wireless device, the transmission will be sent automatically. After your physician reviews your transmission, you will receive a notification with your next transmission date.  Dr Croitoru recommends that you schedule a follow-up appointment in 12 months with a pacemaker check. You will receive a reminder letter in the mail two months in advance. If you don't receive a letter, please call our office to schedule the follow-up appointment.  If you need a refill on your cardiac medications before your next appointment, please call your pharmacy. 

## 2017-03-24 NOTE — Telephone Encounter (Signed)
Dachsund document dictated.

## 2017-04-07 ENCOUNTER — Other Ambulatory Visit: Payer: Self-pay

## 2017-04-07 MED ORDER — ROSUVASTATIN CALCIUM 10 MG PO TABS: 10.0000 mg | ORAL_TABLET | Freq: Every day | ORAL | 3 refills | Status: DC

## 2017-06-23 ENCOUNTER — Encounter: Payer: Medicare Other | Admitting: *Deleted

## 2017-06-25 ENCOUNTER — Encounter: Payer: Self-pay | Admitting: Cardiology

## 2017-07-07 ENCOUNTER — Ambulatory Visit (INDEPENDENT_AMBULATORY_CARE_PROVIDER_SITE_OTHER): Payer: Medicare Other | Admitting: *Deleted

## 2017-07-07 DIAGNOSIS — I442 Atrioventricular block, complete: Secondary | ICD-10-CM | POA: Diagnosis not present

## 2017-07-07 NOTE — Progress Notes (Signed)
Remote pacemaker transmission.   

## 2017-07-08 LAB — CUP PACEART REMOTE DEVICE CHECK
Battery Voltage: 2.99 V
Brady Statistic AP VP Percent: 3.19 %
Brady Statistic AP VS Percent: 0 %
Brady Statistic AS VP Percent: 96.76 %
Brady Statistic AS VS Percent: 0.05 %
Brady Statistic RA Percent Paced: 3.18 %
Brady Statistic RV Percent Paced: 99.87 %
Date Time Interrogation Session: 20180912045043
Implantable Lead Implant Date: 20150312
Implantable Lead Location: 753858
Implantable Lead Location: 753859
Lead Channel Impedance Value: 475 Ohm
Lead Channel Impedance Value: 513 Ohm
Lead Channel Impedance Value: 532 Ohm
Lead Channel Impedance Value: 570 Ohm
Lead Channel Impedance Value: 608 Ohm
Lead Channel Impedance Value: 893 Ohm
Lead Channel Pacing Threshold Amplitude: 0.75 V
Lead Channel Sensing Intrinsic Amplitude: 21.125 mV
Lead Channel Sensing Intrinsic Amplitude: 3 mV
Lead Channel Sensing Intrinsic Amplitude: 3 mV
Lead Channel Setting Pacing Amplitude: 2.5 V
Lead Channel Setting Sensing Sensitivity: 5.6 mV
MDC IDC LEAD IMPLANT DT: 20130418
MDC IDC LEAD IMPLANT DT: 20130418
MDC IDC LEAD LOCATION: 753860
MDC IDC MSMT BATTERY REMAINING LONGEVITY: 44 mo
MDC IDC MSMT LEADCHNL LV IMPEDANCE VALUE: 608 Ohm
MDC IDC MSMT LEADCHNL LV IMPEDANCE VALUE: 665 Ohm
MDC IDC MSMT LEADCHNL RA IMPEDANCE VALUE: 551 Ohm
MDC IDC MSMT LEADCHNL RA PACING THRESHOLD AMPLITUDE: 0.5 V
MDC IDC MSMT LEADCHNL RA PACING THRESHOLD PULSEWIDTH: 0.4 ms
MDC IDC MSMT LEADCHNL RV PACING THRESHOLD PULSEWIDTH: 0.4 ms
MDC IDC MSMT LEADCHNL RV SENSING INTR AMPL: 21.125 mV
MDC IDC PG IMPLANT DT: 20150311
MDC IDC SET LEADCHNL LV PACING AMPLITUDE: 2 V
MDC IDC SET LEADCHNL LV PACING PULSEWIDTH: 0.8 ms
MDC IDC SET LEADCHNL RA PACING AMPLITUDE: 2 V
MDC IDC SET LEADCHNL RV PACING PULSEWIDTH: 0.4 ms

## 2017-07-09 ENCOUNTER — Encounter: Payer: Self-pay | Admitting: Cardiology

## 2017-08-18 ENCOUNTER — Other Ambulatory Visit: Payer: Self-pay | Admitting: *Deleted

## 2017-08-18 DIAGNOSIS — I714 Abdominal aortic aneurysm, without rupture, unspecified: Secondary | ICD-10-CM

## 2017-08-18 DIAGNOSIS — Z136 Encounter for screening for cardiovascular disorders: Secondary | ICD-10-CM

## 2017-08-18 DIAGNOSIS — I739 Peripheral vascular disease, unspecified: Secondary | ICD-10-CM

## 2017-09-29 ENCOUNTER — Ambulatory Visit (HOSPITAL_COMMUNITY)
Admission: RE | Admit: 2017-09-29 | Discharge: 2017-09-29 | Disposition: A | Discharge status: Home / Self Care | Payer: Medicare Other | Source: Ambulatory Visit | Attending: Cardiovascular Disease | Admitting: Cardiovascular Disease

## 2017-09-29 DIAGNOSIS — I739 Peripheral vascular disease, unspecified: Secondary | ICD-10-CM | POA: Diagnosis not present

## 2017-09-29 DIAGNOSIS — I708 Atherosclerosis of other arteries: Secondary | ICD-10-CM | POA: Insufficient documentation

## 2017-09-29 DIAGNOSIS — I714 Abdominal aortic aneurysm, without rupture, unspecified: Secondary | ICD-10-CM

## 2017-10-04 ENCOUNTER — Other Ambulatory Visit: Payer: Self-pay | Admitting: Cardiovascular Disease

## 2017-10-06 ENCOUNTER — Telehealth: Payer: Self-pay | Admitting: Cardiology

## 2017-10-06 ENCOUNTER — Ambulatory Visit (INDEPENDENT_AMBULATORY_CARE_PROVIDER_SITE_OTHER): Payer: Medicare Other | Admitting: *Deleted

## 2017-10-06 DIAGNOSIS — I442 Atrioventricular block, complete: Secondary | ICD-10-CM | POA: Diagnosis not present

## 2017-10-06 NOTE — Telephone Encounter (Signed)
Spoke with pt and reminded pt of remote transmission that is due today. Pt verbalized understanding.   

## 2017-10-07 NOTE — Progress Notes (Signed)
Remote pacemaker transmission.   

## 2017-10-08 LAB — CUP PACEART REMOTE DEVICE CHECK
Battery Remaining Longevity: 42 mo
Battery Voltage: 2.98 V
Brady Statistic AP VP Percent: 8.25 %
Brady Statistic AP VS Percent: 0 %
Brady Statistic AS VP Percent: 91.54 %
Brady Statistic RA Percent Paced: 8.21 %
Date Time Interrogation Session: 20181213023122
Implantable Lead Implant Date: 20130418
Implantable Lead Location: 753858
Implantable Lead Location: 753859
Implantable Lead Location: 753860
Implantable Pulse Generator Implant Date: 20150311
Lead Channel Impedance Value: 475 Ohm
Lead Channel Impedance Value: 513 Ohm
Lead Channel Impedance Value: 532 Ohm
Lead Channel Impedance Value: 589 Ohm
Lead Channel Impedance Value: 684 Ohm
Lead Channel Impedance Value: 912 Ohm
Lead Channel Pacing Threshold Amplitude: 0.5 V
Lead Channel Sensing Intrinsic Amplitude: 13.5 mV
Lead Channel Sensing Intrinsic Amplitude: 3.625 mV
Lead Channel Sensing Intrinsic Amplitude: 3.625 mV
Lead Channel Setting Pacing Amplitude: 2 V
Lead Channel Setting Pacing Amplitude: 2.5 V
Lead Channel Setting Pacing Pulse Width: 0.4 ms
Lead Channel Setting Pacing Pulse Width: 0.8 ms
Lead Channel Setting Sensing Sensitivity: 5.6 mV
MDC IDC LEAD IMPLANT DT: 20130418
MDC IDC LEAD IMPLANT DT: 20150312
MDC IDC MSMT LEADCHNL LV IMPEDANCE VALUE: 627 Ohm
MDC IDC MSMT LEADCHNL RA IMPEDANCE VALUE: 570 Ohm
MDC IDC MSMT LEADCHNL RA IMPEDANCE VALUE: 627 Ohm
MDC IDC MSMT LEADCHNL RA PACING THRESHOLD PULSEWIDTH: 0.4 ms
MDC IDC MSMT LEADCHNL RV PACING THRESHOLD AMPLITUDE: 0.75 V
MDC IDC MSMT LEADCHNL RV PACING THRESHOLD PULSEWIDTH: 0.4 ms
MDC IDC MSMT LEADCHNL RV SENSING INTR AMPL: 13.5 mV
MDC IDC SET LEADCHNL LV PACING AMPLITUDE: 2 V
MDC IDC STAT BRADY AS VS PERCENT: 0.21 %
MDC IDC STAT BRADY RV PERCENT PACED: 99.42 %

## 2017-10-12 ENCOUNTER — Encounter: Payer: Self-pay | Admitting: Cardiology

## 2017-10-21 ENCOUNTER — Other Ambulatory Visit: Payer: Self-pay

## 2017-10-21 DIAGNOSIS — I714 Abdominal aortic aneurysm, without rupture, unspecified: Secondary | ICD-10-CM

## 2018-01-05 ENCOUNTER — Ambulatory Visit (INDEPENDENT_AMBULATORY_CARE_PROVIDER_SITE_OTHER): Payer: Medicare Other | Admitting: *Deleted

## 2018-01-05 ENCOUNTER — Telehealth: Payer: Self-pay | Admitting: Cardiology

## 2018-01-05 DIAGNOSIS — I442 Atrioventricular block, complete: Secondary | ICD-10-CM | POA: Diagnosis not present

## 2018-01-05 NOTE — Telephone Encounter (Signed)
Spoke with pt and reminded pt of remote transmission that is due today. Pt verbalized understanding.   

## 2018-01-06 ENCOUNTER — Encounter: Payer: Self-pay | Admitting: Cardiology

## 2018-01-11 ENCOUNTER — Encounter: Payer: Self-pay | Admitting: Cardiology

## 2018-01-11 NOTE — Progress Notes (Signed)
Remote pacemaker transmission.   

## 2018-01-18 LAB — CUP PACEART REMOTE DEVICE CHECK
Battery Remaining Longevity: 41 mo
Battery Voltage: 2.97 V
Brady Statistic AS VS Percent: 0.23 %
Brady Statistic RA Percent Paced: 4.2 %
Brady Statistic RV Percent Paced: 99.46 %
Date Time Interrogation Session: 20190315075732
Implantable Lead Implant Date: 20130418
Implantable Lead Implant Date: 20150312
Implantable Lead Location: 753858
Implantable Lead Location: 753859
Implantable Pulse Generator Implant Date: 20150311
Lead Channel Impedance Value: 570 Ohm
Lead Channel Impedance Value: 589 Ohm
Lead Channel Impedance Value: 665 Ohm
Lead Channel Pacing Threshold Pulse Width: 0.4 ms
Lead Channel Pacing Threshold Pulse Width: 0.4 ms
Lead Channel Sensing Intrinsic Amplitude: 13.5 mV
Lead Channel Sensing Intrinsic Amplitude: 13.5 mV
Lead Channel Sensing Intrinsic Amplitude: 3.125 mV
Lead Channel Setting Pacing Amplitude: 2 V
Lead Channel Setting Pacing Pulse Width: 0.4 ms
Lead Channel Setting Pacing Pulse Width: 0.8 ms
MDC IDC LEAD IMPLANT DT: 20130418
MDC IDC LEAD LOCATION: 753860
MDC IDC MSMT LEADCHNL LV IMPEDANCE VALUE: 532 Ohm
MDC IDC MSMT LEADCHNL LV IMPEDANCE VALUE: 703 Ohm
MDC IDC MSMT LEADCHNL LV IMPEDANCE VALUE: 912 Ohm
MDC IDC MSMT LEADCHNL RA IMPEDANCE VALUE: 646 Ohm
MDC IDC MSMT LEADCHNL RA PACING THRESHOLD AMPLITUDE: 0.5 V
MDC IDC MSMT LEADCHNL RA SENSING INTR AMPL: 3.125 mV
MDC IDC MSMT LEADCHNL RV IMPEDANCE VALUE: 532 Ohm
MDC IDC MSMT LEADCHNL RV IMPEDANCE VALUE: 608 Ohm
MDC IDC MSMT LEADCHNL RV PACING THRESHOLD AMPLITUDE: 0.875 V
MDC IDC SET LEADCHNL RA PACING AMPLITUDE: 2 V
MDC IDC SET LEADCHNL RV PACING AMPLITUDE: 2.5 V
MDC IDC SET LEADCHNL RV SENSING SENSITIVITY: 5.6 mV
MDC IDC STAT BRADY AP VP PERCENT: 4.21 %
MDC IDC STAT BRADY AP VS PERCENT: 0 %
MDC IDC STAT BRADY AS VP PERCENT: 95.56 %

## 2018-01-26 ENCOUNTER — Other Ambulatory Visit: Payer: Self-pay | Admitting: Cardiovascular Disease

## 2018-01-26 NOTE — Telephone Encounter (Signed)
REFILL 

## 2018-03-16 ENCOUNTER — Encounter: Payer: Self-pay | Admitting: Cardiovascular Disease

## 2018-03-16 ENCOUNTER — Ambulatory Visit: Payer: Medicare Other | Admitting: Cardiovascular Disease

## 2018-03-16 VITALS — BP 125/68 | HR 63 | Ht 70.0 in | Wt 161.8 lb

## 2018-03-16 DIAGNOSIS — I10 Essential (primary) hypertension: Secondary | ICD-10-CM

## 2018-03-16 DIAGNOSIS — E782 Mixed hyperlipidemia: Secondary | ICD-10-CM | POA: Diagnosis not present

## 2018-03-16 DIAGNOSIS — I471 Supraventricular tachycardia: Secondary | ICD-10-CM

## 2018-03-16 DIAGNOSIS — I714 Abdominal aortic aneurysm, without rupture, unspecified: Secondary | ICD-10-CM

## 2018-03-16 DIAGNOSIS — E1159 Type 2 diabetes mellitus with other circulatory complications: Secondary | ICD-10-CM

## 2018-03-16 DIAGNOSIS — I739 Peripheral vascular disease, unspecified: Secondary | ICD-10-CM

## 2018-03-16 DIAGNOSIS — I25810 Atherosclerosis of coronary artery bypass graft(s) without angina pectoris: Secondary | ICD-10-CM

## 2018-03-16 DIAGNOSIS — I442 Atrioventricular block, complete: Secondary | ICD-10-CM

## 2018-03-16 DIAGNOSIS — J449 Chronic obstructive pulmonary disease, unspecified: Secondary | ICD-10-CM | POA: Diagnosis not present

## 2018-03-16 DIAGNOSIS — Z950 Presence of cardiac pacemaker: Secondary | ICD-10-CM

## 2018-03-16 DIAGNOSIS — I5042 Chronic combined systolic (congestive) and diastolic (congestive) heart failure: Secondary | ICD-10-CM | POA: Diagnosis not present

## 2018-03-16 NOTE — Progress Notes (Signed)
Patient ID: [name], male   DOB: Jun 20, 1946, 72 y.o.   MRN: [id number]    Cardiology Office Note    Date:  03/16/2018   ID:  [name], DOB 16-Oct-1946, MRN [id number]  PCP:  Lorenda Ishihara, MD  Cardiologist:  [name], MD:  Thurmon Fair, MD   Chief Complaint  Patient presents with  . Follow-up    History of Present Illness:  [name] is a 72 y.o. male with ischemic cardiomyopathy, chronic systolic and diastolic heart failure, complete heart block, frequent sustained paroxysmal atrial tachycardia and a biventricular pacemaker. He also has advanced chronic obstructive lung disease related to long-standing previous smoking, type 2 diabetes mellitus on oral antidiabetics, small AAA (3 cm by [place] 09/2017), moderate bilateral PAD of lower extremities.  [name] is doing well both physically and emotionally.  He still becomes tearful when he talks about his wife, but he seems to have gone over the initial intense grief and is engaged with his family and friends.  His weight stays very constant around 160 pounds and his typical blood pressure is in the 120s/60s cautious with his diet.  Is a push more without dyspnea.  He does not have leg edema.  He has not experienced any angina pectoris.  He has not had palpitations, dizziness or syncope.  He denies focal neurological events.  Psoriasis flares up periodically.  Diabetes control and for slightly with his last hemoglobin A1c up to 7.2%.  He is on pioglitazone and metformin and sulfonylurea.  Insurance refused to cover SGLT2 inhibitors and medication cost is a problem..  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   Still visits his wife's grave weekly, 72 almost 2 years since her passing.  Pacemaker interrogation shows normal device function. He is pacemaker dependent due to complete heart block. His Medtronic consulta CRT-D device was implanted  in 2015 and has roughly 3 more years of generator longevity. He has 99.8 % biventricular pacing as well as 5 % atrial pacing. Lead parameters are unchanged. No significant episodes of atrial fibrillation or ventricular tachycardia seen on the current check.  Thoracic impedance has been very volatile and does not seem to correlate with any edema or increasing shortness of breath.  Diabetes control has improved markedly.  From 11% his hemoglobin A1c decreased to 6.8%, increased back up slightly to 7.2% this February.  He has mild hypertriglyceridemia, but his LDL cholesterol was 65.  HDL is chronically low, most recently 34.  Liver function tests are normal.  Creatinine is normal at 0.98.  He has a long-standing history of coronary disease and underwent bypass surgery in 2007. He had left bundle branch block for years and in 2013 received a dual-chamber permanent pacemaker for complete heart block Just before getting his pacemaker he underwent cardiac catheterization which showed a patent sequential saphenous vein graft bypass to the first and second oblique marginal arteries (both of these vessels had previously been stented and had severe in-stent restenosis).  He has no underlying escape rhythm and is 100% paced. He requires relatively high doses of beta blockers for control of recurrent paroxysmal atrial tachycardia which was quite symptomatic. Left ventricular ejection fraction had been 45-50% prior to pacemaker implantation. Following implantation of his pacemaker his echocardiogram showed moderately depressed left ventricular systolic function with an ejection fraction of 35-40%. He improved following biventricular pacemaker upgrade, but then had lead dislodgment  and deteriorated again. After repositioning of his left ventricular lead, left ventricular ejection fraction increased to 50-55 %- by his most recent echocardiogram in December 2015.  Attempts at increased ACE inhibitor in the past led to  severe symptomatic hypotension.  May 2017 had lower extremity arterial Dopplers showing  >50% right external and left common iliac artery stenosis. 30-49% right SFA disease, without focal stenosis. 50-74% left distal SFA stenosis. Occluded bilateral anterior tibial artery disease, two vessel run-off, bilaterally. Bilateral ABI 1.1-1.26 September 2017 AAA 3.0 cm.   Past Medical History:  Diagnosis Date  . Atrial tachycardia (HCC)   . Cardiomyopathy (HCC)    mixed ischemic and nonischemic  . CHB (complete heart block) (HCC) 02/11/12   Medtronic permanent pacemaker  . CHF (congestive heart failure) (HCC)   . COPD (chronic obstructive pulmonary disease) (HCC)   . Coronary artery disease   . DOE (dyspnea on exertion)    NYHA class 2-3  . DVT (deep venous thrombosis) (HCC) 12/2005   RLE "had to have OR"  . Dyslipidemia   . GERD (gastroesophageal reflux disease)   . Hypertension   . LBBB (left bundle branch block)   . Myocardial infarction (HCC)    "I've had 6" (01/03/2014)  . Psoriasis   . Sleep apnea    c pap at home, does not use  . Type II diabetes mellitus (HCC)     Past Surgical History:  Procedure Laterality Date  . BI-VENTRICULAR PACEMAKER REVISION N/A 03/06/2014   Procedure: BI-VENTRICULAR PACEMAKER REVISION (CRT-R);  Surgeon: Marinus Maw, MD;  Location: [facility] CATH LAB;  Service: Cardiovascular;  Laterality: N/A;  . CARDIAC CATHETERIZATION  02/10/12   left CX: dominant w/40% prox. AV groove stenosis.The first & second marginal branches had stents visualized near the ostium of the vessel bu were functionally occluded. The remainder of the dominant CX had minor irregularities.  RCA: nondominant free of sign. disease. SVG to OM1 & OM2 sequentially was widely patent.  [name] CATARACT EXTRACTION Left   . CORONARY ANGIOPLASTY WITH STENT PLACEMENT     "I had 9 stents before the [facility] in 2007" (01/03/2014)  . CORONARY ARTERY BYPASS GRAFT  2007   x 3  . ELECTROPHYSIOLOGY STUDY  01/03/2014    EPS by Dr Ladona Ridgel with no inducible ventricular arrhythmias  . ELECTROPHYSIOLOGY STUDY N/A 01/03/2014   Procedure: ELECTROPHYSIOLOGY STUDY;  Surgeon: Marinus Maw, MD;  Location[facility] CATH LAB;  Service: Cardiovascular;  Laterality: N/A;  . LEAD REVISION  03-06-2014   LV lead revision by Dr Ladona Ridgel  . LEFT HEART CATHETERIZATION WITH CORONARY/GRAFT ANGIOGRAM  02/10/2012   Procedure: LEFT HEART CATHETERIZATION WITH Isabel Caprice;  Surgeon: Runell Gess, MD;  Location: [facility] CATH LAB;  Service: Cardiovascular;;  . NM MYOCAR PERF WALL MOTION  10/02/2009   no significant ischemia  . PACEMAKER INSERTION  2013; 01/03/2014   MDT dual chamber pacemaker implanted 2013 for complete heart block; upgrade to CRTP (MDT) by Dr Ladona Ridgel 12/2013 due to cardiomyopathy and heart failure  . PERMANENT PACEMAKER INSERTION N/A 02/11/2012   Procedure: PERMANENT PACEMAKER INSERTION;  Surgeon: Thurmon Fair, MD;  Location: [facility] CATH LAB;  Service: Cardiovascular;  Laterality: N/A;  . REFRACTIVE SURGERY Right   . TEMPORARY PACEMAKER INSERTION Bilateral 02/10/2012   Procedure: TEMPORARY PACEMAKER INSERTION;  Surgeon: Runell Gess, MD;  Location: [facility] CATH LAB;  Service: Cardiovascular;  Laterality: Bilateral;  . US ECHOCARDIOGRAPHY  01/10/10   LV systolic fx mod to severely reduced, EF 35-40%,  AOV mildly sclerotic    Current Medications: Outpatient Medications Prior to Visit  Medication Sig Dispense Refill  . albuterol-ipratropium (COMBIVENT) 18-103 MCG/ACT inhaler Inhale 2 puffs into the lungs every 6 (six) hours as needed for wheezing or shortness of breath.    [name] aspirin EC 81 MG tablet Take 81 mg by mouth daily.    . clopidogrel (PLAVIX) 75 MG tablet TAKE 1 TABLET BY MOUTH  DAILY 90 tablet 2  . furosemide (LASIX) 20 MG tablet TAKE 1 TABLET BY MOUTH  DAILY 90 tablet 2  . glipiZIDE (GLUCOTROL XL) 5 MG 24 hr tablet Take 5 mg by mouth daily.    . isosorbide mononitrate (IMDUR) 30 MG 24 hr tablet TAKE 1 TABLET BY MOUTH   DAILY 90 tablet 2  . lisinopril (PRINIVIL,ZESTRIL) 2.5 MG tablet TAKE 1 TABLET BY MOUTH  DAILY 90 tablet 2  . metFORMIN (GLUCOPHAGE) 1000 MG tablet Take 1 tablet (1,000 mg total) by mouth 2 (two) times daily with a meal. HOLD for 2 days. Restart 03/10/2014.    . metoprolol tartrate (LOPRESSOR) 100 MG tablet TAKE 1 TABLET BY MOUTH TWO  TIMES DAILY 180 tablet 2  . nitroGLYCERIN (NITROSTAT) 0.4 MG SL tablet Place 1 tablet (0.4 mg total) under the tongue every 5 (five) minutes as needed for chest pain. 25 tablet 6  . pantoprazole (PROTONIX) 40 MG tablet Take 40 mg by mouth daily as needed (acid reflux).     . pioglitazone (ACTOS) 15 MG tablet Take 15 mg by mouth daily.    . rosuvastatin (CRESTOR) 10 MG tablet Take 1 tablet (10 mg total) by mouth daily. KEEP OV. 90 tablet 0  . gabapentin (NEURONTIN) 300 MG capsule Take 1 capsule (300 mg total) by mouth 2 (two) times daily. 60 capsule 12   No facility-administered medications prior to visit.      Allergies:   Pollen extract   Social History   Socioeconomic History  . Marital status: Married    Spouse name: Not on file  . Number of children: Not on file  . Years of education: Not on file  . Highest education level: Not on file  Occupational History  . Not on file  Social Needs  . Financial resource strain: Not on file  . Food insecurity:    Worry: Not on file    Inability: Not on file  . Transportation needs:    Medical: Not on file    Non-medical: Not on file  Tobacco Use  . Smoking status: Former Smoker    Packs/day: 15.00    Years: 36.00    Pack years: 540.00    Types: Cigars, Cigarettes    Last attempt to quit: 05/25/2005    Years since quitting: 12.8  . Smokeless tobacco: Never Used  . Tobacco comment: 15 cigars/daily when smoking  Substance and Sexual Activity  . Alcohol use: Yes    Alcohol/week: 0.0 oz    Comment: "quit alcohol in 1985 or 1986"  . Drug use: No  . Sexual activity: Not Currently  Lifestyle  . Physical  activity:    Days per week: Not on file    Minutes per session: Not on file  . Stress: Not on file  Relationships  . Social connections:    Talks on phone: Not on file    Gets together: Not on file    Attends religious service: Not on file    Active member of club or organization: Not on file  Attends meetings of clubs or organizations: Not on file    Relationship status: Not on file  Other Topics Concern  . Not on file  Social History Narrative  . Not on file     Family History:  The patient's family history includes Cancer in his father.   ROS:   Please see the history of present illness.    ROS All other systems reviewed and are negative.   PHYSICAL EXAM:   VS:  BP (!) 147/83   Pulse 63   Ht 5\' 10"  (1.778 m)   Wt 161 lb 12.8 oz (73.4 kg)   BMI 23.22 kg/m      General: Alert, oriented x3, no distress, looks Head: no evidence of trauma, PERRL, EOMI, no exophtalmos or lid lag, no myxedema, no xanthelasma; normal ears, nose and oropharynx Neck: normal jugular venous pulsations and no hepatojugular reflux; brisk carotid pulses without delay and no carotid bruits Chest: clear to auscultation, no signs of consolidation by percussion or palpation, normal fremitus, symmetrical and full respiratory excursions, well-healed ICD site left subclavian area Cardiovascular: normal position and quality of the apical impulse, regular rhythm, normal first and paradoxically split second heart sounds, no murmurs, rubs or gallops Abdomen: no tenderness or distention, no masses by palpation, no abnormal pulsatility or arterial bruits, normal bowel sounds, no hepatosplenomegaly Extremities: no clubbing, cyanosis or edema; 2+ radial, ulnar and brachial pulses bilaterally; 2+ right femoral, posterior tibial and dorsalis pedis pulses; 2+ left femoral, posterior tibial and dorsalis pedis pulses; no subclavian or femoral bruits Neurological: grossly nonfocal Psych: Normal mood and affect Skin:  Multiple psoriasis plaques seen especially elbows and forearms   Wt Readings from Last 3 Encounters:  03/16/18 161 lb 12.8 oz (73.4 kg)  03/24/17 168 lb (76.2 kg)  07/07/16 167 lb 3.2 oz (75.8 kg)      Studies/Labs Reviewed:   EKG:  EKG is ordered today.  The ekg ordered today demonstrates atrial sensed ventricular paced rhythm, positive R waves in leads V1-V2 consistent with left ventricular pacing, QRS 62 ms, QTC 483 ms Recent Labs:  February 2019 creatinine 0.98, A1c 7.2%, normal liver function tests   Lipid Panel  total cholesterol 154, HDL 34, LDL 55, triglycerides 276  ASSESSMENT:    No diagnosis found.   PLAN:  In order of problems listed above:  1. CHF: Clinically euvolemic, NYHA functional class II with impairment mostly related to lung problems.  Thoracic impedance does not appear to correlate too well with his symptoms.  On beta-blockers and ACE inhibitors, previous attempts at increasing the dose of ACE inhibitor led to symptomatic hypotension.  For the same reason it is unlikely we will tolerate Entresto which is also not affordable.  I would avoid higher dose of his TZD, with clinical heart failure exacerbation 2. CHB: Pacemaker dependent.  3. HTN: Well-controlled. Dis not tolerate higher doses of RAAS inhibitors. 4. PAT: No AFib reported work for example recurrence in the last several years, currently on aspirin and clopidogrel; resume anticoagulants if atrial fibrillation is detected by his pacemaker. 5. CAD: Angina free, last cardiac catheterization in 2013. Known inferior scar by previous nuclear stress testing. No ischemia on last perfusion study. 6. COPD: No active wheezing today. He quit smoking 10 years ago or so. 7. CRT-P: Normal device function, continue downloads every 3 months. Device followed by Dr. [name] 8. HLP: Recent labs show satisfactory LDL on rosuvastatin . The elevated triglycerides not to justify additional medications.. 9. DM,  complicated by PAD: Fair control.  Slight recent deterioration.  Avoid higher doses of TZD's.  It would be great if we could get him on Jardiance or similar medication.. 10. PAD: Does not have intermittent claudication, most recent episode of likely most consistent with radicular pain/sciatica has resolved.  11. AAA: Infrarenal fusiform dilation of the distal aorta last measured 3 cm in December 2018. Reevaluate next year. 12. Grief: adjusting well.     Medication Adjustments/Labs and Tests Ordered: Current medicines are reviewed at length with the patient today.  Concerns regarding medicines are outlined above.  Medication changes, Labs and Tests ordered today are listed in the Patient Instructions below. There are no Patient Instructions on file for this visit.   Signed, Thurmon Fair, MD  03/16/2018 8:40 AM    [facility] [address] [place], [place], [place]  [address] Phone: [phone]; Fax: [phone]

## 2018-03-16 NOTE — Patient Instructions (Signed)
Dr Royann Shivers recommends that you continue on your current medications as directed. Please refer to the Current Medication list given to you today.  Remote monitoring is used to monitor your Pacemaker or ICD from home. This monitoring reduces the number of office visits required to check your device to one time per year. It allows [place] to keep an eye on the functioning of your device to ensure it is working properly. You are scheduled for a device check from home on Tuesday, June 18th, 2019. You may send your transmission at any time that day. If you have a wireless device, the transmission will be sent automatically. After your physician reviews your transmission, you will receive a notification with your next transmission date.  To improve our patient care and to more adequately follow your device, [facility] has decided, as a practice, to start following each patient four times a year with your home monitor. This means that you may experience a remote appointment that is close to an in-office appointment with your physician. Your insurance will apply at the same rate as other remote monitoring transmissions.  Dr Royann Shivers recommends that you schedule a follow-up appointment in 12 months with a pacemaker check. You will receive a reminder letter in the mail two months in advance. If you don't receive a letter, please call our office to schedule the follow-up appointment.  If you need a refill on your cardiac medications before your next appointment, please call your pharmacy.

## 2018-03-18 ENCOUNTER — Encounter: Payer: Self-pay | Admitting: Cardiovascular Disease

## 2018-03-30 ENCOUNTER — Other Ambulatory Visit: Payer: Self-pay | Admitting: Cardiovascular Disease

## 2018-04-12 ENCOUNTER — Ambulatory Visit (INDEPENDENT_AMBULATORY_CARE_PROVIDER_SITE_OTHER): Payer: Medicare Other | Admitting: *Deleted

## 2018-04-12 ENCOUNTER — Telehealth: Payer: Self-pay | Admitting: Cardiology

## 2018-04-12 DIAGNOSIS — I5022 Chronic systolic (congestive) heart failure: Secondary | ICD-10-CM

## 2018-04-12 DIAGNOSIS — I442 Atrioventricular block, complete: Secondary | ICD-10-CM

## 2018-04-12 NOTE — Telephone Encounter (Signed)
Spoke with pt and reminded pt of remote transmission that is due today. Pt verbalized understanding.   

## 2018-04-12 NOTE — Progress Notes (Signed)
Remote pacemaker transmission.   

## 2018-04-15 LAB — CUP PACEART REMOTE DEVICE CHECK
Battery Remaining Longevity: 40 mo
Battery Voltage: 2.97 V
Brady Statistic AP VS Percent: 0 %
Brady Statistic AS VP Percent: 96.85 %
Brady Statistic RA Percent Paced: 2.94 %
Brady Statistic RV Percent Paced: 99.63 %
Implantable Lead Implant Date: 20130418
Implantable Lead Implant Date: 20150312
Implantable Pulse Generator Implant Date: 20150311
Lead Channel Impedance Value: 456 Ohm
Lead Channel Impedance Value: 513 Ohm
Lead Channel Impedance Value: 551 Ohm
Lead Channel Impedance Value: 570 Ohm
Lead Channel Impedance Value: 608 Ohm
Lead Channel Pacing Threshold Amplitude: 0.5 V
Lead Channel Pacing Threshold Amplitude: 0.875 V
Lead Channel Pacing Threshold Pulse Width: 0.4 ms
Lead Channel Sensing Intrinsic Amplitude: 16.5 mV
Lead Channel Sensing Intrinsic Amplitude: 16.5 mV
Lead Channel Sensing Intrinsic Amplitude: 3.125 mV
Lead Channel Setting Pacing Amplitude: 2 V
Lead Channel Setting Pacing Amplitude: 2 V
Lead Channel Setting Sensing Sensitivity: 5.6 mV
MDC IDC LEAD IMPLANT DT: 20130418
MDC IDC LEAD LOCATION: 753858
MDC IDC LEAD LOCATION: 753859
MDC IDC LEAD LOCATION: 753860
MDC IDC MSMT LEADCHNL LV IMPEDANCE VALUE: 456 Ohm
MDC IDC MSMT LEADCHNL LV IMPEDANCE VALUE: 608 Ohm
MDC IDC MSMT LEADCHNL LV IMPEDANCE VALUE: 798 Ohm
MDC IDC MSMT LEADCHNL RA PACING THRESHOLD PULSEWIDTH: 0.4 ms
MDC IDC MSMT LEADCHNL RA SENSING INTR AMPL: 3.125 mV
MDC IDC MSMT LEADCHNL RV IMPEDANCE VALUE: 494 Ohm
MDC IDC SESS DTM: 20190618231746
MDC IDC SET LEADCHNL LV PACING PULSEWIDTH: 0.8 ms
MDC IDC SET LEADCHNL RV PACING AMPLITUDE: 2.5 V
MDC IDC SET LEADCHNL RV PACING PULSEWIDTH: 0.4 ms
MDC IDC STAT BRADY AP VP PERCENT: 2.95 %
MDC IDC STAT BRADY AS VS PERCENT: 0.21 %

## 2018-06-01 ENCOUNTER — Other Ambulatory Visit: Payer: Self-pay | Admitting: Cardiovascular Disease

## 2018-06-01 NOTE — Telephone Encounter (Signed)
Rx sent to pharmacy   

## 2018-07-12 ENCOUNTER — Encounter: Payer: Medicare Other | Admitting: *Deleted

## 2018-07-12 ENCOUNTER — Telehealth: Payer: Self-pay | Admitting: Cardiology

## 2018-07-12 NOTE — Telephone Encounter (Signed)
Spoke with pt and reminded pt of remote transmission that is due today. Pt verbalized understanding.   

## 2018-07-13 ENCOUNTER — Encounter: Payer: Self-pay | Admitting: Cardiology

## 2018-07-18 ENCOUNTER — Ambulatory Visit (INDEPENDENT_AMBULATORY_CARE_PROVIDER_SITE_OTHER): Payer: Medicare Other | Admitting: *Deleted

## 2018-07-18 DIAGNOSIS — I442 Atrioventricular block, complete: Secondary | ICD-10-CM | POA: Diagnosis not present

## 2018-07-19 ENCOUNTER — Encounter: Payer: Self-pay | Admitting: Cardiology

## 2018-07-19 NOTE — Progress Notes (Signed)
Remote pacemaker transmission.   

## 2018-07-31 LAB — CUP PACEART REMOTE DEVICE CHECK
Battery Voltage: 2.96 V
Date Time Interrogation Session: 20190924000856
Implantable Lead Implant Date: 20130418
Implantable Lead Location: 753858
Implantable Lead Location: 753859
Implantable Pulse Generator Implant Date: 20150311
Lead Channel Impedance Value: 437 Ohm
Lead Channel Impedance Value: 475 Ohm
Lead Channel Impedance Value: 513 Ohm
Lead Channel Impedance Value: 798 Ohm
Lead Channel Sensing Intrinsic Amplitude: 14.875 mV
Lead Channel Sensing Intrinsic Amplitude: 3.125 mV
Lead Channel Setting Pacing Pulse Width: 0.4 ms
MDC IDC LEAD IMPLANT DT: 20130418
MDC IDC LEAD IMPLANT DT: 20150312
MDC IDC LEAD LOCATION: 753860
MDC IDC MSMT BATTERY REMAINING LONGEVITY: 37 mo
MDC IDC MSMT LEADCHNL LV IMPEDANCE VALUE: 513 Ohm
MDC IDC MSMT LEADCHNL LV IMPEDANCE VALUE: 551 Ohm
MDC IDC MSMT LEADCHNL LV IMPEDANCE VALUE: 608 Ohm
MDC IDC MSMT LEADCHNL RA IMPEDANCE VALUE: 570 Ohm
MDC IDC MSMT LEADCHNL RA IMPEDANCE VALUE: 608 Ohm
MDC IDC MSMT LEADCHNL RA PACING THRESHOLD AMPLITUDE: 0.5 V
MDC IDC MSMT LEADCHNL RA PACING THRESHOLD PULSEWIDTH: 0.4 ms
MDC IDC MSMT LEADCHNL RA SENSING INTR AMPL: 3.125 mV
MDC IDC MSMT LEADCHNL RV PACING THRESHOLD AMPLITUDE: 0.75 V
MDC IDC MSMT LEADCHNL RV PACING THRESHOLD PULSEWIDTH: 0.4 ms
MDC IDC MSMT LEADCHNL RV SENSING INTR AMPL: 14.875 mV
MDC IDC SET LEADCHNL LV PACING AMPLITUDE: 2 V
MDC IDC SET LEADCHNL LV PACING PULSEWIDTH: 0.8 ms
MDC IDC SET LEADCHNL RA PACING AMPLITUDE: 2 V
MDC IDC SET LEADCHNL RV PACING AMPLITUDE: 2.5 V
MDC IDC SET LEADCHNL RV SENSING SENSITIVITY: 5.6 mV
MDC IDC STAT BRADY AP VP PERCENT: 5.71 %
MDC IDC STAT BRADY AP VS PERCENT: 0 %
MDC IDC STAT BRADY AS VP PERCENT: 94.09 %
MDC IDC STAT BRADY AS VS PERCENT: 0.2 %
MDC IDC STAT BRADY RA PERCENT PACED: 5.7 %
MDC IDC STAT BRADY RV PERCENT PACED: 99.34 %

## 2018-08-29 ENCOUNTER — Telehealth: Payer: Self-pay | Admitting: Cardiovascular Disease

## 2018-08-29 NOTE — Telephone Encounter (Signed)
No message needed °

## 2018-10-17 ENCOUNTER — Ambulatory Visit (INDEPENDENT_AMBULATORY_CARE_PROVIDER_SITE_OTHER): Payer: Medicare Other

## 2018-10-17 DIAGNOSIS — I442 Atrioventricular block, complete: Secondary | ICD-10-CM | POA: Diagnosis not present

## 2018-10-17 DIAGNOSIS — I5022 Chronic systolic (congestive) heart failure: Secondary | ICD-10-CM

## 2018-10-18 LAB — CUP PACEART REMOTE DEVICE CHECK
Battery Remaining Longevity: 40 mo
Battery Voltage: 2.96 V
Brady Statistic AS VP Percent: 88.62 %
Brady Statistic AS VS Percent: 0.04 %
Date Time Interrogation Session: 20191224022755
Implantable Lead Implant Date: 20130418
Implantable Lead Implant Date: 20130418
Implantable Lead Location: 753860
Implantable Pulse Generator Implant Date: 20150311
Lead Channel Impedance Value: 418 Ohm
Lead Channel Impedance Value: 437 Ohm
Lead Channel Impedance Value: 475 Ohm
Lead Channel Impedance Value: 494 Ohm
Lead Channel Impedance Value: 551 Ohm
Lead Channel Sensing Intrinsic Amplitude: 14.625 mV
Lead Channel Setting Pacing Amplitude: 2 V
Lead Channel Setting Pacing Amplitude: 2.5 V
Lead Channel Setting Pacing Pulse Width: 0.8 ms
MDC IDC LEAD IMPLANT DT: 20150312
MDC IDC LEAD LOCATION: 753858
MDC IDC LEAD LOCATION: 753859
MDC IDC MSMT LEADCHNL LV IMPEDANCE VALUE: 551 Ohm
MDC IDC MSMT LEADCHNL LV IMPEDANCE VALUE: 589 Ohm
MDC IDC MSMT LEADCHNL LV IMPEDANCE VALUE: 779 Ohm
MDC IDC MSMT LEADCHNL RA IMPEDANCE VALUE: 608 Ohm
MDC IDC MSMT LEADCHNL RA PACING THRESHOLD AMPLITUDE: 0.5 V
MDC IDC MSMT LEADCHNL RA PACING THRESHOLD PULSEWIDTH: 0.4 ms
MDC IDC MSMT LEADCHNL RA SENSING INTR AMPL: 3 mV
MDC IDC MSMT LEADCHNL RA SENSING INTR AMPL: 3 mV
MDC IDC MSMT LEADCHNL RV PACING THRESHOLD AMPLITUDE: 0.75 V
MDC IDC MSMT LEADCHNL RV PACING THRESHOLD PULSEWIDTH: 0.4 ms
MDC IDC MSMT LEADCHNL RV SENSING INTR AMPL: 14.625 mV
MDC IDC SET LEADCHNL LV PACING AMPLITUDE: 2 V
MDC IDC SET LEADCHNL RV PACING PULSEWIDTH: 0.4 ms
MDC IDC SET LEADCHNL RV SENSING SENSITIVITY: 5.6 mV
MDC IDC STAT BRADY AP VP PERCENT: 11.34 %
MDC IDC STAT BRADY AP VS PERCENT: 0 %
MDC IDC STAT BRADY RA PERCENT PACED: 11.33 %
MDC IDC STAT BRADY RV PERCENT PACED: 99.91 %

## 2018-10-18 NOTE — Progress Notes (Signed)
Remote pacemaker transmission.   

## 2018-11-24 LAB — CUP PACEART INCLINIC DEVICE CHECK
Date Time Interrogation Session: 20200130140528
Implantable Lead Implant Date: 20130418
Implantable Lead Implant Date: 20130418
Implantable Lead Implant Date: 20150312
Implantable Lead Location: 753858
Implantable Lead Location: 753859
MDC IDC LEAD LOCATION: 753860
MDC IDC PG IMPLANT DT: 20150311

## 2019-01-16 ENCOUNTER — Other Ambulatory Visit: Payer: Self-pay

## 2019-01-16 ENCOUNTER — Ambulatory Visit (INDEPENDENT_AMBULATORY_CARE_PROVIDER_SITE_OTHER): Payer: Medicare Other | Admitting: *Deleted

## 2019-01-16 DIAGNOSIS — I442 Atrioventricular block, complete: Secondary | ICD-10-CM | POA: Diagnosis not present

## 2019-01-16 DIAGNOSIS — I5022 Chronic systolic (congestive) heart failure: Secondary | ICD-10-CM

## 2019-01-17 ENCOUNTER — Telehealth: Payer: Self-pay

## 2019-01-17 LAB — CUP PACEART REMOTE DEVICE CHECK
Battery Remaining Longevity: 49 mo
Brady Statistic AP VS Percent: 0 %
Brady Statistic AS VS Percent: 0.04 %
Brady Statistic RA Percent Paced: 15.56 %
Brady Statistic RV Percent Paced: 99.91 %
Implantable Lead Implant Date: 20130418
Implantable Lead Implant Date: 20150312
Implantable Lead Location: 753858
Implantable Lead Location: 753859
Lead Channel Impedance Value: 4047 Ohm
Lead Channel Impedance Value: 418 Ohm
Lead Channel Impedance Value: 551 Ohm
Lead Channel Impedance Value: 608 Ohm
Lead Channel Pacing Threshold Amplitude: 0.5 V
Lead Channel Pacing Threshold Amplitude: 0.75 V
Lead Channel Pacing Threshold Pulse Width: 0.4 ms
Lead Channel Sensing Intrinsic Amplitude: 17.125 mV
Lead Channel Setting Pacing Amplitude: 2 V
Lead Channel Setting Pacing Amplitude: 2 V
MDC IDC LEAD IMPLANT DT: 20130418
MDC IDC LEAD LOCATION: 753860
MDC IDC MSMT BATTERY VOLTAGE: 2.96 V
MDC IDC MSMT LEADCHNL LV IMPEDANCE VALUE: 4047 Ohm
MDC IDC MSMT LEADCHNL LV IMPEDANCE VALUE: 4047 Ohm
MDC IDC MSMT LEADCHNL LV IMPEDANCE VALUE: 513 Ohm
MDC IDC MSMT LEADCHNL RA PACING THRESHOLD PULSEWIDTH: 0.4 ms
MDC IDC MSMT LEADCHNL RA SENSING INTR AMPL: 2.75 mV
MDC IDC MSMT LEADCHNL RA SENSING INTR AMPL: 2.75 mV
MDC IDC MSMT LEADCHNL RV IMPEDANCE VALUE: 437 Ohm
MDC IDC MSMT LEADCHNL RV IMPEDANCE VALUE: 494 Ohm
MDC IDC MSMT LEADCHNL RV SENSING INTR AMPL: 17.125 mV
MDC IDC PG IMPLANT DT: 20150311
MDC IDC SESS DTM: 20200324143158
MDC IDC SET LEADCHNL LV PACING PULSEWIDTH: 0.8 ms
MDC IDC SET LEADCHNL RV PACING AMPLITUDE: 2.5 V
MDC IDC SET LEADCHNL RV PACING PULSEWIDTH: 0.4 ms
MDC IDC SET LEADCHNL RV SENSING SENSITIVITY: 5.6 mV
MDC IDC STAT BRADY AP VP PERCENT: 15.59 %
MDC IDC STAT BRADY AS VP PERCENT: 84.37 %

## 2019-01-17 NOTE — Telephone Encounter (Signed)
Spoke with patient to remind of missed remote transmission 

## 2019-01-24 NOTE — Progress Notes (Signed)
Remote pacemaker transmission.   

## 2019-02-28 ENCOUNTER — Other Ambulatory Visit: Payer: Self-pay | Admitting: Cardiovascular Disease

## 2019-02-28 NOTE — Telephone Encounter (Signed)
Furosemide and isosorbide refilled.

## 2019-03-14 ENCOUNTER — Encounter: Payer: Medicare Other | Admitting: Internal Medicine

## 2019-03-16 ENCOUNTER — Telehealth: Payer: Self-pay | Admitting: *Deleted

## 2019-03-16 NOTE — Telephone Encounter (Signed)
Notes recorded by Nigel Sloop, RN on 01/23/2019 at 2:59 PM EDT Per Dr. Ladona Ridgel, plan for OV in May to assess LV lead pacing vector. Routed to scheduler for assistance.   Noted that patient's appointment with Dr. Ladona Ridgel was rescheduled from 03/14/19 to 06/19/19. Called pt, he reports he did not want to come in due to COVID and wasn't aware of reasoning for appointment. Advised that we will need to see him sooner than August to assess his LV lead. Pt verbalizes understanding, agreeable to appointment with Dr. Ladona Ridgel on 04/25/19 at 10:15am.   Pt reports some dizzy spells and increased ShOB in recent months. Followed by [facility] heart failure case management--pt reports weight, BP, and HR stable. Dizzy spells mostly occur with position changes, also notes he "stumbles" when standing up due to neuropathy in his feet. BPs run 140s/70s, though sometimes as high as 180s systolic. Pt reports compliance with all cardiac meds. Advised I'll send this message to Dr. Ladona Ridgel to see if he recommends f/u sooner than 04/25/19. Advised to call back for worsening symptoms. Pt verbalized understanding and thanked me for my call.

## 2019-04-17 ENCOUNTER — Ambulatory Visit (INDEPENDENT_AMBULATORY_CARE_PROVIDER_SITE_OTHER): Payer: Medicare Other | Admitting: *Deleted

## 2019-04-17 DIAGNOSIS — I442 Atrioventricular block, complete: Secondary | ICD-10-CM

## 2019-04-17 DIAGNOSIS — I5022 Chronic systolic (congestive) heart failure: Secondary | ICD-10-CM

## 2019-04-18 ENCOUNTER — Telehealth: Payer: Self-pay

## 2019-04-18 LAB — CUP PACEART REMOTE DEVICE CHECK
Battery Remaining Longevity: 47 mo
Battery Voltage: 2.96 V
Brady Statistic AP VP Percent: 10.7 %
Brady Statistic AP VS Percent: 0 %
Brady Statistic AS VP Percent: 89.27 %
Brady Statistic AS VS Percent: 0.03 %
Brady Statistic RA Percent Paced: 10.66 %
Brady Statistic RV Percent Paced: 99.93 %
Date Time Interrogation Session: 20200623134507
Implantable Lead Implant Date: 20130418
Implantable Lead Implant Date: 20130418
Implantable Lead Implant Date: 20150312
Implantable Lead Location: 753858
Implantable Lead Location: 753859
Implantable Lead Location: 753860
Implantable Pulse Generator Implant Date: 20150311
Lead Channel Impedance Value: 4047 Ohm
Lead Channel Impedance Value: 4047 Ohm
Lead Channel Impedance Value: 4047 Ohm
Lead Channel Impedance Value: 418 Ohm
Lead Channel Impedance Value: 456 Ohm
Lead Channel Impedance Value: 513 Ohm
Lead Channel Impedance Value: 532 Ohm
Lead Channel Impedance Value: 570 Ohm
Lead Channel Impedance Value: 627 Ohm
Lead Channel Pacing Threshold Amplitude: 0.5 V
Lead Channel Pacing Threshold Amplitude: 0.75 V
Lead Channel Pacing Threshold Pulse Width: 0.4 ms
Lead Channel Pacing Threshold Pulse Width: 0.4 ms
Lead Channel Sensing Intrinsic Amplitude: 17.375 mV
Lead Channel Sensing Intrinsic Amplitude: 17.375 mV
Lead Channel Sensing Intrinsic Amplitude: 2.5 mV
Lead Channel Sensing Intrinsic Amplitude: 2.5 mV
Lead Channel Setting Pacing Amplitude: 2 V
Lead Channel Setting Pacing Amplitude: 2 V
Lead Channel Setting Pacing Amplitude: 2.5 V
Lead Channel Setting Pacing Pulse Width: 0.4 ms
Lead Channel Setting Pacing Pulse Width: 0.8 ms
Lead Channel Setting Sensing Sensitivity: 5.6 mV

## 2019-04-18 NOTE — Telephone Encounter (Signed)
Left message for patient to remind of missed remote transmission.  

## 2019-04-24 ENCOUNTER — Telehealth: Payer: Self-pay | Admitting: Internal Medicine

## 2019-04-24 NOTE — Telephone Encounter (Signed)
New Message           COVID-19 Pre-Screening Questions:   In the past 7 to 10 days have you had a cough,  shortness of breath, headache, congestion, fever (100 or greater) body aches, chills, sore throat, or sudden loss of taste or sense of smell? Dry cough, he thinks from his BP medicine and he said he has been SOB since he had a heart attack   Have you been around anyone with known Covid 19. NO  Have you been around anyone who is awaiting Covid 19 test results in the past 7 to 10 days? NO  Have you been around anyone who has been exposed to Covid 19, or has mentioned symptoms of Covid 19 within the past 7 to 10 days? NO  If you have any concerns/questions about symptoms patients report during screening (either on the phone or at threshold). Contact the provider seeing the patient or DOD for further guidance.  If neither are available contact a member of the leadership team.

## 2019-04-25 ENCOUNTER — Encounter: Payer: Self-pay | Admitting: Internal Medicine

## 2019-04-25 ENCOUNTER — Ambulatory Visit (INDEPENDENT_AMBULATORY_CARE_PROVIDER_SITE_OTHER): Payer: Medicare Other | Admitting: Internal Medicine

## 2019-04-25 ENCOUNTER — Other Ambulatory Visit: Payer: Self-pay

## 2019-04-25 VITALS — BP 128/72 | HR 60 | Ht 70.0 in | Wt 156.2 lb

## 2019-04-25 DIAGNOSIS — Z950 Presence of cardiac pacemaker: Secondary | ICD-10-CM

## 2019-04-25 DIAGNOSIS — I5042 Chronic combined systolic (congestive) and diastolic (congestive) heart failure: Secondary | ICD-10-CM

## 2019-04-25 DIAGNOSIS — I442 Atrioventricular block, complete: Secondary | ICD-10-CM | POA: Diagnosis not present

## 2019-04-25 LAB — CUP PACEART INCLINIC DEVICE CHECK
Battery Remaining Longevity: 47 mo
Battery Voltage: 2.96 V
Brady Statistic AP VP Percent: 10.39 %
Brady Statistic AP VS Percent: 0 %
Brady Statistic AS VP Percent: 89.52 %
Brady Statistic AS VS Percent: 0.09 %
Brady Statistic RA Percent Paced: 10.37 %
Brady Statistic RV Percent Paced: 99.76 %
Date Time Interrogation Session: 20200630113727
Implantable Lead Implant Date: 20130418
Implantable Lead Implant Date: 20130418
Implantable Lead Implant Date: 20150312
Implantable Lead Location: 753858
Implantable Lead Location: 753859
Implantable Lead Location: 753860
Implantable Pulse Generator Implant Date: 20150311
Lead Channel Impedance Value: 4047 Ohm
Lead Channel Impedance Value: 4047 Ohm
Lead Channel Impedance Value: 4047 Ohm
Lead Channel Impedance Value: 437 Ohm
Lead Channel Impedance Value: 475 Ohm
Lead Channel Impedance Value: 532 Ohm
Lead Channel Impedance Value: 551 Ohm
Lead Channel Impedance Value: 589 Ohm
Lead Channel Impedance Value: 646 Ohm
Lead Channel Pacing Threshold Amplitude: 0.5 V
Lead Channel Pacing Threshold Amplitude: 0.75 V
Lead Channel Pacing Threshold Pulse Width: 0.4 ms
Lead Channel Pacing Threshold Pulse Width: 0.4 ms
Lead Channel Sensing Intrinsic Amplitude: 17.375 mV
Lead Channel Sensing Intrinsic Amplitude: 17.375 mV
Lead Channel Sensing Intrinsic Amplitude: 3.125 mV
Lead Channel Sensing Intrinsic Amplitude: 3.375 mV
Lead Channel Setting Pacing Amplitude: 2 V
Lead Channel Setting Pacing Amplitude: 2 V
Lead Channel Setting Pacing Amplitude: 2.5 V
Lead Channel Setting Pacing Pulse Width: 0.4 ms
Lead Channel Setting Pacing Pulse Width: 0.8 ms
Lead Channel Setting Sensing Sensitivity: 5.6 mV

## 2019-04-25 NOTE — Progress Notes (Signed)
HPI [name] returns today for ongoing evaluation and management of CAD, chronic systolic heart failure, COPD, and atrial arrhythmias. In the interim, the patient has not been sob. He denies syncope. No edema. He is still sad over the loss of his wife.  Allergies  Allergen Reactions  . Pollen Extract Other (See Comments)    "runny nose"     Current Outpatient Medications  Medication Sig Dispense Refill  . albuterol-ipratropium (COMBIVENT) 18-103 MCG/ACT inhaler Inhale 2 puffs into the lungs every 6 (six) hours as needed for wheezing or shortness of breath.    [name]. aspirin EC 81 MG tablet Take 81 mg by mouth daily.    . clopidogrel (PLAVIX) 75 MG tablet TAKE 1 TABLET BY MOUTH  DAILY 90 tablet 2  . furosemide (LASIX) 20 MG tablet TAKE 1 TABLET BY MOUTH  DAILY 90 tablet 0  . glipiZIDE (GLUCOTROL XL) 5 MG 24 hr tablet Take 5 mg by mouth daily.    . isosorbide mononitrate (IMDUR) 30 MG 24 hr tablet TAKE 1 TABLET BY MOUTH  DAILY 90 tablet 0  . lisinopril (ZESTRIL) 2.5 MG tablet TAKE 1 TABLET BY MOUTH  DAILY 90 tablet 2  . metFORMIN (GLUCOPHAGE) 1000 MG tablet Take 1 tablet (1,000 mg total) by mouth 2 (two) times daily with a meal. HOLD for 2 days. Restart 03/10/2014.    . metoprolol tartrate (LOPRESSOR) 100 MG tablet TAKE 1 TABLET BY MOUTH TWO  TIMES DAILY 180 tablet 2  . nitroGLYCERIN (NITROSTAT) 0.4 MG SL tablet Place 1 tablet (0.4 mg total) under the tongue every 5 (five) minutes as needed for chest pain. 25 tablet 6  . pantoprazole (PROTONIX) 40 MG tablet Take 40 mg by mouth daily as needed (acid reflux).     . pioglitazone (ACTOS) 15 MG tablet Take 15 mg by mouth daily.    . rosuvastatin (CRESTOR) 10 MG tablet TAKE 1 TABLET BY MOUTH  DAILY 90 tablet 3   No current facility-administered medications for this visit.      Past Medical History:  Diagnosis Date  . Atrial tachycardia (HCC)   . Cardiomyopathy (HCC)    mixed ischemic and nonischemic  . CHB (complete heart block) (HCC)  02/11/12   Medtronic permanent pacemaker  . CHF (congestive heart failure) (HCC)   . COPD (chronic obstructive pulmonary disease) (HCC)   . Coronary artery disease   . DOE (dyspnea on exertion)    NYHA class 2-3  . DVT (deep venous thrombosis) (HCC) 12/2005   RLE "had to have OR"  . Dyslipidemia   . GERD (gastroesophageal reflux disease)   . Hypertension   . LBBB (left bundle branch block)   . Myocardial infarction (HCC)    "I've had 6" (01/03/2014)  . Psoriasis   . Sleep apnea    c pap at home, does not use  . Type II diabetes mellitus (HCC)     ROS:   All systems reviewed and negative except as noted in the HPI.   Past Surgical History:  Procedure Laterality Date  . BI-VENTRICULAR PACEMAKER REVISION N/A 03/06/2014   Procedure: BI-VENTRICULAR PACEMAKER REVISION (CRT-R);  Surgeon: Marinus MawGregg W Taylor, MD;  [facility];  Service: Cardiovascular;  Laterality: N/A;  . CARDIAC CATHETERIZATION  02/10/12   left CX: dominant w/40% prox. AV groove stenosis.The first & second marginal branches had stents visualized near the ostium of the vessel bu were functionally occluded. The remainder of the dominant CX had minor  irregularities.  RCA: nondominant free of sign. disease. SVG to OM1 & OM2 sequentially was widely patent.  [name] CATARACT EXTRACTION Left   . CORONARY ANGIOPLASTY WITH STENT PLACEMENT     "I had 9 stents before the [facility] in 2007" (01/03/2014)  . CORONARY ARTERY BYPASS GRAFT  2007   x 3  . ELECTROPHYSIOLOGY STUDY  01/03/2014   EPS by Dr Ladona Ridgel with no inducible ventricular arrhythmias  . ELECTROPHYSIOLOGY STUDY N/A 01/03/2014   Procedure: ELECTROPHYSIOLOGY STUDY;  Surgeon: Marinus Maw, MD;  Location: [facility] CATH LAB;  Service: Cardiovascular;  Laterality: N/A;  . LEAD REVISION  03-06-2014   LV lead revision by Dr Ladona Ridgel  . LEFT HEART CATHETERIZATION WITH CORONARY/GRAFT ANGIOGRAM  02/10/2012   Procedure: LEFT HEART CATHETERIZATION WITH Isabel Caprice;  Surgeon: Runell Gess, MD;  Location: [facility] CATH LAB;  Service: Cardiovascular;;  . NM MYOCAR PERF WALL MOTION  10/02/2009   no significant ischemia  . PACEMAKER INSERTION  2013; 01/03/2014   MDT dual chamber pacemaker implanted 2013 for complete heart block; upgrade to CRTP (MDT) by Dr Ladona Ridgel 12/2013 due to cardiomyopathy and heart failure  . PERMANENT PACEMAKER INSERTION N/A 02/11/2012   Procedure: PERMANENT PACEMAKER INSERTION;  Surgeon: Thurmon Fair, MD;  Location: [facility] CATH LAB;  Service: Cardiovascular;  Laterality: N/A;  . REFRACTIVE SURGERY Right   . TEMPORARY PACEMAKER INSERTION Bilateral 02/10/2012   Procedure: TEMPORARY PACEMAKER INSERTION;  Surgeon: Runell Gess, MD;  Location: [facility] CATH LAB;  Service: Cardiovascular;  Laterality: Bilateral;  . US ECHOCARDIOGRAPHY  01/10/10   LV systolic fx mod to severely reduced, EF 35-40%, AOV mildly sclerotic     Family History  Problem Relation Age of Onset  . Cancer Father      Social History   Socioeconomic History  . Marital status: Married    Spouse name: Not on file  . Number of children: Not on file  . Years of education: Not on file  . Highest education level: Not on file  Occupational History  . Not on file  Social Needs  . Financial resource strain: Not on file  . Food insecurity    Worry: Not on file    Inability: Not on file  . Transportation needs    Medical: Not on file    Non-medical: Not on file  Tobacco Use  . Smoking status: Former Smoker    Packs/day: 15.00    Years: 36.00    Pack years: 540.00    Types: Cigars, Cigarettes    Quit date: 05/25/2005    Years since quitting: 13.9  . Smokeless tobacco: Never Used  . Tobacco comment: 15 cigars/daily when smoking  Substance and Sexual Activity  . Alcohol use: Yes    Alcohol/week: 0.0 standard drinks    Comment: "quit alcohol in 1985 or 1986"  . Drug use: No  . Sexual activity: Not Currently  Lifestyle  . Physical activity    Days per week: Not on file    Minutes per  session: Not on file  . Stress: Not on file  Relationships  . Social Musician on phone: Not on file    Gets together: Not on file    Attends religious service: Not on file    Active member of club or organization: Not on file    Attends meetings of clubs or organizations: Not on file    Relationship status: Not on file  . Intimate partner violence    Fear  of current or ex partner: Not on file    Emotionally abused: Not on file    Physically abused: Not on file    Forced sexual activity: Not on file  Other Topics Concern  . Not on file  Social History Narrative  . Not on file     BP 128/72   Pulse 60   Ht 5\' 10"  (1.778 m)   Wt 156 lb 3.2 oz (70.9 kg)   SpO2 96%   BMI 22.41 kg/m   Physical Exam:  Well appearing NAD HEENT: Unremarkable Neck:  No JVD, no thyromegally Lymphatics:  No adenopathy Back:  No CVA tenderness Lungs:  Clear HEART:  Regular rate rhythm, no murmurs, no rubs, no clicks Abd:  soft, positive bowel sounds, no organomegally, no rebound, no guarding Ext:  2 plus pulses, no edema, no cyanosis, no clubbing Skin:  No rashes no nodules Neuro:  CN II through XII intact, motor grossly intact  EKG - nsr with ventricular pacing  DEVICE  Normal device function.  See PaceArt for details.   Assess/Plan: 1. Chronic systolic heart failure - he had a normalization of his LV function.  2. BIV PPM - his medtronic device has been reprogrammed after the LV ring has been found to not be working correctly. He is programmed tip to can with no diaphragmatic stim.  3. CHB - he is asymptomatic,s /p PPM insertion.  Mikle Bosworth.D.

## 2019-04-25 NOTE — Progress Notes (Signed)
Remote pacemaker transmission.   

## 2019-04-25 NOTE — Patient Instructions (Signed)

## 2019-04-27 ENCOUNTER — Other Ambulatory Visit: Payer: Self-pay | Admitting: Internal Medicine

## 2019-05-02 ENCOUNTER — Telehealth: Payer: Self-pay | Admitting: Cardiovascular Disease

## 2019-05-02 NOTE — Telephone Encounter (Signed)
LVM for patient to call and confirm appt on 05-15-19 with Dr. Sallyanne Kuster.

## 2019-05-08 ENCOUNTER — Encounter: Payer: Medicare Other | Admitting: Cardiovascular Disease

## 2019-05-10 ENCOUNTER — Other Ambulatory Visit: Payer: Self-pay | Admitting: Cardiovascular Disease

## 2019-05-10 ENCOUNTER — Telehealth: Payer: Self-pay | Admitting: *Deleted

## 2019-05-10 DIAGNOSIS — I714 Abdominal aortic aneurysm, without rupture, unspecified: Secondary | ICD-10-CM

## 2019-05-10 NOTE — Telephone Encounter (Signed)
Call placed to the patient. He stated that he would like to postpone the appointment with Dr. Sallyanne Kuster and would also like to postpone the AAA until 6 months.   PCP has been called and labs will be faxed.

## 2019-05-10 NOTE — Telephone Encounter (Signed)
I am perfectly fine with him putting off his appointment for him with me for 6 months, especially since he just saw Dr. Lovena Le.  Can even make it a virtual visit in 6 months if he is concerned about coming to the office. I think it is important to get his abdominal aortic ultrasound done before the end of the year.  We have not measured his aneurysm since December 2018.  We might be able to get his appointment with me and the ultrasound scheduled on the same day so he only makes one trip, if he thinks that is a good idea

## 2019-05-15 ENCOUNTER — Encounter: Payer: Medicare Other | Admitting: Cardiovascular Disease

## 2019-05-17 NOTE — Telephone Encounter (Signed)
Left a message for the patient to call back. He will need to have his AAA ultrasound done sooner if he agrees.

## 2019-05-18 NOTE — Telephone Encounter (Signed)
Follow up  Patient is calling back about scheduling his appointment for his ultrasound. Please give him a call back and he said he will figure it out with getting it scheduled.

## 2019-05-19 NOTE — Telephone Encounter (Signed)
The patient has been advised on Dr. Victorino December recommendations and agreed. He has also been advised of all the sanitary measures the office is taking to help protect our patients.

## 2019-06-19 ENCOUNTER — Encounter: Payer: Medicare Other | Admitting: Internal Medicine

## 2019-07-01 ENCOUNTER — Other Ambulatory Visit: Payer: Self-pay | Admitting: Cardiovascular Disease

## 2019-07-20 ENCOUNTER — Ambulatory Visit (HOSPITAL_COMMUNITY)
Admission: RE | Admit: 2019-07-20 | Discharge: 2019-07-20 | Disposition: A | Discharge status: Home / Self Care | Payer: Medicare Other | Source: Ambulatory Visit | Attending: Cardiology | Admitting: Cardiology

## 2019-07-20 ENCOUNTER — Other Ambulatory Visit: Payer: Self-pay

## 2019-07-20 ENCOUNTER — Other Ambulatory Visit (HOSPITAL_COMMUNITY): Payer: Self-pay | Admitting: Cardiovascular Disease

## 2019-07-20 DIAGNOSIS — I714 Abdominal aortic aneurysm, without rupture, unspecified: Secondary | ICD-10-CM

## 2019-07-25 ENCOUNTER — Ambulatory Visit (INDEPENDENT_AMBULATORY_CARE_PROVIDER_SITE_OTHER): Payer: Medicare Other | Admitting: *Deleted

## 2019-07-25 DIAGNOSIS — I442 Atrioventricular block, complete: Secondary | ICD-10-CM

## 2019-07-25 DIAGNOSIS — I5022 Chronic systolic (congestive) heart failure: Secondary | ICD-10-CM

## 2019-07-27 ENCOUNTER — Telehealth: Payer: Self-pay | Admitting: Emergency Medicine

## 2019-07-27 LAB — CUP PACEART REMOTE DEVICE CHECK
Battery Remaining Longevity: 30 mo
Battery Voltage: 2.94 V
Brady Statistic AP VP Percent: 17.13 %
Brady Statistic AP VS Percent: 0 %
Brady Statistic AS VP Percent: 82.86 %
Brady Statistic AS VS Percent: 0.02 %
Brady Statistic RA Percent Paced: 17.12 %
Brady Statistic RV Percent Paced: 99.96 %
Date Time Interrogation Session: 20201001120359
Implantable Lead Implant Date: 20130418
Implantable Lead Implant Date: 20130418
Implantable Lead Implant Date: 20150312
Implantable Lead Location: 753858
Implantable Lead Location: 753859
Implantable Lead Location: 753860
Implantable Pulse Generator Implant Date: 20150311
Lead Channel Impedance Value: 4047 Ohm
Lead Channel Impedance Value: 4047 Ohm
Lead Channel Impedance Value: 4047 Ohm
Lead Channel Impedance Value: 437 Ohm
Lead Channel Impedance Value: 437 Ohm
Lead Channel Impedance Value: 494 Ohm
Lead Channel Impedance Value: 551 Ohm
Lead Channel Impedance Value: 570 Ohm
Lead Channel Impedance Value: 608 Ohm
Lead Channel Pacing Threshold Amplitude: 0.5 V
Lead Channel Pacing Threshold Amplitude: 0.75 V
Lead Channel Pacing Threshold Pulse Width: 0.4 ms
Lead Channel Pacing Threshold Pulse Width: 0.4 ms
Lead Channel Sensing Intrinsic Amplitude: 17.375 mV
Lead Channel Sensing Intrinsic Amplitude: 17.375 mV
Lead Channel Sensing Intrinsic Amplitude: 2.625 mV
Lead Channel Sensing Intrinsic Amplitude: 2.625 mV
Lead Channel Setting Pacing Amplitude: 2 V
Lead Channel Setting Pacing Amplitude: 2 V
Lead Channel Setting Pacing Amplitude: 2.5 V
Lead Channel Setting Pacing Pulse Width: 0.4 ms
Lead Channel Setting Pacing Pulse Width: 0.8 ms
Lead Channel Setting Sensing Sensitivity: 5.6 mV

## 2019-07-27 NOTE — Telephone Encounter (Signed)
Spoke with patient who called with c/o of feeling like his heart "had the hiccups". He had spoken to [facility] rep and sent transmission after speaking with her. No CP, SOB is unchanged from his baseline due to COPD per patient. Has sensation when he bends over and when he has BM. Sensation is under his pacemaker site. Remote transmission showed normal PPM function and no events. Explained to patient that it may be STIM and offered to schedule appointment but patient declined and will call back if has increased amount of these episodes.

## 2019-08-04 NOTE — Progress Notes (Signed)
Remote pacemaker transmission.   

## 2019-10-03 ENCOUNTER — Other Ambulatory Visit: Payer: Self-pay | Admitting: Cardiovascular Disease

## 2019-10-24 ENCOUNTER — Ambulatory Visit (INDEPENDENT_AMBULATORY_CARE_PROVIDER_SITE_OTHER): Payer: Medicare Other | Admitting: *Deleted

## 2019-10-24 DIAGNOSIS — I442 Atrioventricular block, complete: Secondary | ICD-10-CM

## 2019-10-25 LAB — CUP PACEART REMOTE DEVICE CHECK
Battery Remaining Longevity: 30 mo
Battery Voltage: 2.93 V
Brady Statistic AP VP Percent: 8.39 %
Brady Statistic AP VS Percent: 0 %
Brady Statistic AS VP Percent: 91.58 %
Brady Statistic AS VS Percent: 0.03 %
Brady Statistic RA Percent Paced: 8.39 %
Brady Statistic RV Percent Paced: 99.92 %
Date Time Interrogation Session: 20201230194215
Implantable Lead Implant Date: 20130418
Implantable Lead Implant Date: 20130418
Implantable Lead Implant Date: 20150312
Implantable Lead Location: 753858
Implantable Lead Location: 753859
Implantable Lead Location: 753860
Implantable Pulse Generator Implant Date: 20150311
Lead Channel Impedance Value: 4047 Ohm
Lead Channel Impedance Value: 4047 Ohm
Lead Channel Impedance Value: 4047 Ohm
Lead Channel Impedance Value: 418 Ohm
Lead Channel Impedance Value: 456 Ohm
Lead Channel Impedance Value: 494 Ohm
Lead Channel Impedance Value: 551 Ohm
Lead Channel Impedance Value: 570 Ohm
Lead Channel Impedance Value: 608 Ohm
Lead Channel Pacing Threshold Amplitude: 0.5 V
Lead Channel Pacing Threshold Amplitude: 0.75 V
Lead Channel Pacing Threshold Pulse Width: 0.4 ms
Lead Channel Pacing Threshold Pulse Width: 0.4 ms
Lead Channel Sensing Intrinsic Amplitude: 18.25 mV
Lead Channel Sensing Intrinsic Amplitude: 18.25 mV
Lead Channel Sensing Intrinsic Amplitude: 3.125 mV
Lead Channel Sensing Intrinsic Amplitude: 3.125 mV
Lead Channel Setting Pacing Amplitude: 2 V
Lead Channel Setting Pacing Amplitude: 2 V
Lead Channel Setting Pacing Amplitude: 2.5 V
Lead Channel Setting Pacing Pulse Width: 0.4 ms
Lead Channel Setting Pacing Pulse Width: 0.8 ms
Lead Channel Setting Sensing Sensitivity: 5.6 mV

## 2020-01-23 ENCOUNTER — Ambulatory Visit (INDEPENDENT_AMBULATORY_CARE_PROVIDER_SITE_OTHER): Payer: Medicare Other | Admitting: *Deleted

## 2020-01-23 DIAGNOSIS — I442 Atrioventricular block, complete: Secondary | ICD-10-CM | POA: Diagnosis not present

## 2020-01-24 LAB — CUP PACEART REMOTE DEVICE CHECK
Battery Remaining Longevity: 26 mo
Battery Voltage: 2.92 V
Brady Statistic AP VP Percent: 7.26 %
Brady Statistic AP VS Percent: 0 %
Brady Statistic AS VP Percent: 92.7 %
Brady Statistic AS VS Percent: 0.04 %
Brady Statistic RA Percent Paced: 7.25 %
Brady Statistic RV Percent Paced: 99.91 %
Date Time Interrogation Session: 20210331221142
Implantable Lead Implant Date: 20130418
Implantable Lead Implant Date: 20130418
Implantable Lead Implant Date: 20150312
Implantable Lead Location: 753858
Implantable Lead Location: 753859
Implantable Lead Location: 753860
Implantable Pulse Generator Implant Date: 20150311
Lead Channel Impedance Value: 399 Ohm
Lead Channel Impedance Value: 4047 Ohm
Lead Channel Impedance Value: 4047 Ohm
Lead Channel Impedance Value: 4047 Ohm
Lead Channel Impedance Value: 456 Ohm
Lead Channel Impedance Value: 513 Ohm
Lead Channel Impedance Value: 532 Ohm
Lead Channel Impedance Value: 589 Ohm
Lead Channel Impedance Value: 646 Ohm
Lead Channel Pacing Threshold Amplitude: 0.5 V
Lead Channel Pacing Threshold Amplitude: 0.625 V
Lead Channel Pacing Threshold Pulse Width: 0.4 ms
Lead Channel Pacing Threshold Pulse Width: 0.4 ms
Lead Channel Sensing Intrinsic Amplitude: 18.25 mV
Lead Channel Sensing Intrinsic Amplitude: 18.25 mV
Lead Channel Sensing Intrinsic Amplitude: 2.5 mV
Lead Channel Sensing Intrinsic Amplitude: 2.5 mV
Lead Channel Setting Pacing Amplitude: 2 V
Lead Channel Setting Pacing Amplitude: 2 V
Lead Channel Setting Pacing Amplitude: 2.5 V
Lead Channel Setting Pacing Pulse Width: 0.4 ms
Lead Channel Setting Pacing Pulse Width: 0.8 ms
Lead Channel Setting Sensing Sensitivity: 5.6 mV

## 2020-03-26 ENCOUNTER — Ambulatory Visit
Admission: RE | Admit: 2020-03-26 | Discharge: 2020-03-26 | Disposition: A | Discharge status: Home / Self Care | Payer: Medicare Other | Source: Ambulatory Visit | Attending: Internal Medicine | Admitting: Internal Medicine

## 2020-03-26 ENCOUNTER — Other Ambulatory Visit: Payer: Self-pay | Admitting: Internal Medicine

## 2020-03-26 DIAGNOSIS — M25511 Pain in right shoulder: Secondary | ICD-10-CM

## 2020-04-24 ENCOUNTER — Telehealth: Payer: Self-pay

## 2020-04-24 NOTE — Telephone Encounter (Signed)
Patient called for assistance with his home monitor he was getting the 3230 code and we tried unplugging it and plug it back in. Still didn't work he will call tech support and call us back

## 2020-04-24 NOTE — Telephone Encounter (Signed)
The pt is receiving a new monitor. He will get it in 7-10 business days.

## 2020-05-07 ENCOUNTER — Ambulatory Visit (INDEPENDENT_AMBULATORY_CARE_PROVIDER_SITE_OTHER): Payer: Medicare Other | Admitting: *Deleted

## 2020-05-07 DIAGNOSIS — I5022 Chronic systolic (congestive) heart failure: Secondary | ICD-10-CM

## 2020-05-07 DIAGNOSIS — I442 Atrioventricular block, complete: Secondary | ICD-10-CM

## 2020-05-07 LAB — CUP PACEART REMOTE DEVICE CHECK
Battery Remaining Longevity: 23 mo
Battery Voltage: 2.92 V
Brady Statistic AP VP Percent: 14.69 %
Brady Statistic AP VS Percent: 0 %
Brady Statistic AS VP Percent: 85.27 %
Brady Statistic AS VS Percent: 0.04 %
Brady Statistic RA Percent Paced: 14.66 %
Brady Statistic RV Percent Paced: 99.9 %
Date Time Interrogation Session: 20210712183520
Implantable Lead Implant Date: 20130418
Implantable Lead Implant Date: 20130418
Implantable Lead Implant Date: 20150312
Implantable Lead Location: 753858
Implantable Lead Location: 753859
Implantable Lead Location: 753860
Implantable Pulse Generator Implant Date: 20150311
Lead Channel Impedance Value: 399 Ohm
Lead Channel Impedance Value: 4047 Ohm
Lead Channel Impedance Value: 4047 Ohm
Lead Channel Impedance Value: 4047 Ohm
Lead Channel Impedance Value: 475 Ohm
Lead Channel Impedance Value: 532 Ohm
Lead Channel Impedance Value: 532 Ohm
Lead Channel Impedance Value: 570 Ohm
Lead Channel Impedance Value: 627 Ohm
Lead Channel Pacing Threshold Amplitude: 0.5 V
Lead Channel Pacing Threshold Amplitude: 0.625 V
Lead Channel Pacing Threshold Pulse Width: 0.4 ms
Lead Channel Pacing Threshold Pulse Width: 0.4 ms
Lead Channel Sensing Intrinsic Amplitude: 18.25 mV
Lead Channel Sensing Intrinsic Amplitude: 18.25 mV
Lead Channel Sensing Intrinsic Amplitude: 3 mV
Lead Channel Sensing Intrinsic Amplitude: 3 mV
Lead Channel Setting Pacing Amplitude: 2 V
Lead Channel Setting Pacing Amplitude: 2 V
Lead Channel Setting Pacing Amplitude: 2.5 V
Lead Channel Setting Pacing Pulse Width: 0.4 ms
Lead Channel Setting Pacing Pulse Width: 0.8 ms
Lead Channel Setting Sensing Sensitivity: 5.6 mV

## 2020-05-07 NOTE — Progress Notes (Signed)
Remote pacemaker transmission.   

## 2020-05-19 ENCOUNTER — Other Ambulatory Visit: Payer: Self-pay | Admitting: Cardiovascular Disease

## 2020-07-19 ENCOUNTER — Ambulatory Visit (HOSPITAL_COMMUNITY)
Admission: RE | Admit: 2020-07-19 | Payer: Medicare Other | Source: Ambulatory Visit | Attending: Cardiovascular Disease | Admitting: Cardiovascular Disease

## 2020-08-05 ENCOUNTER — Telehealth: Payer: Self-pay | Admitting: Cardiovascular Disease

## 2020-08-05 NOTE — Telephone Encounter (Signed)
Spoke with patient to schedule AAA duplex ordered by Dr. Hinda Kehr states he wants to wait until the virus dies down before he schedules

## 2020-08-06 ENCOUNTER — Ambulatory Visit (INDEPENDENT_AMBULATORY_CARE_PROVIDER_SITE_OTHER): Payer: Medicare Other

## 2020-08-06 DIAGNOSIS — I442 Atrioventricular block, complete: Secondary | ICD-10-CM | POA: Diagnosis not present

## 2020-08-09 LAB — CUP PACEART REMOTE DEVICE CHECK
Battery Remaining Longevity: 22 mo
Battery Voltage: 2.9 V
Brady Statistic AP VP Percent: 15.09 %
Brady Statistic AP VS Percent: 0 %
Brady Statistic AS VP Percent: 84.88 %
Brady Statistic AS VS Percent: 0.03 %
Brady Statistic RA Percent Paced: 15.04 %
Brady Statistic RV Percent Paced: 99.89 %
Date Time Interrogation Session: 20211014172254
Implantable Lead Implant Date: 20130418
Implantable Lead Implant Date: 20130418
Implantable Lead Implant Date: 20150312
Implantable Lead Location: 753858
Implantable Lead Location: 753859
Implantable Lead Location: 753860
Implantable Pulse Generator Implant Date: 20150311
Lead Channel Impedance Value: 380 Ohm
Lead Channel Impedance Value: 4047 Ohm
Lead Channel Impedance Value: 4047 Ohm
Lead Channel Impedance Value: 4047 Ohm
Lead Channel Impedance Value: 437 Ohm
Lead Channel Impedance Value: 494 Ohm
Lead Channel Impedance Value: 494 Ohm
Lead Channel Impedance Value: 570 Ohm
Lead Channel Impedance Value: 608 Ohm
Lead Channel Pacing Threshold Amplitude: 0.5 V
Lead Channel Pacing Threshold Amplitude: 0.625 V
Lead Channel Pacing Threshold Pulse Width: 0.4 ms
Lead Channel Pacing Threshold Pulse Width: 0.4 ms
Lead Channel Sensing Intrinsic Amplitude: 18.25 mV
Lead Channel Sensing Intrinsic Amplitude: 18.25 mV
Lead Channel Sensing Intrinsic Amplitude: 3.125 mV
Lead Channel Sensing Intrinsic Amplitude: 3.125 mV
Lead Channel Setting Pacing Amplitude: 2 V
Lead Channel Setting Pacing Amplitude: 2 V
Lead Channel Setting Pacing Amplitude: 2.5 V
Lead Channel Setting Pacing Pulse Width: 0.4 ms
Lead Channel Setting Pacing Pulse Width: 0.8 ms
Lead Channel Setting Sensing Sensitivity: 5.6 mV

## 2020-08-12 NOTE — Progress Notes (Signed)
Remote pacemaker transmission.   

## 2020-09-27 ENCOUNTER — Telehealth: Payer: Self-pay | Admitting: Cardiovascular Disease

## 2020-09-27 NOTE — Telephone Encounter (Signed)
Patient had a referral sent over for CAD but states that Dr. Nehemiah Settle had checked the pulse in his feet and felt like he should be seen soon. I scheduled him for 11/13/20 with Dr. Royann Shivers beings that he did not want to see an APP, patient wants to make sure this appt is not too far out. Please advise.  ?

## 2020-09-27 NOTE — Telephone Encounter (Signed)
Returned call to pt he states that he saw Dr Nehemiah Settle recently and was told he needed to be "referred to Dr C" because "something is wrong with his feet" sounds like pt had decreased pedal pulses. And would only like to be seen by [facility] and not an APP. Appt is 11-13-20 to discuss  Left detailed message for Dr Polite's CMA [phone]. Called back and s/w Carollee Herter she states that she will have someone call back to discuss.

## 2020-09-30 ENCOUNTER — Other Ambulatory Visit: Payer: Self-pay | Admitting: Internal Medicine

## 2020-09-30 DIAGNOSIS — I739 Peripheral vascular disease, unspecified: Secondary | ICD-10-CM

## 2020-09-30 NOTE — Telephone Encounter (Signed)
The patient has been moved to 12/8 with Dr. Royann Shivers. He has verbalized his understanding.

## 2020-09-30 NOTE — Telephone Encounter (Signed)
Left a message for the patient to call back. He does not need a referral since he is already an established patient with Dr. Royann Shivers.   Called to see if the patient can come in on 12/8 at 10:40 am.

## 2020-10-02 ENCOUNTER — Ambulatory Visit (INDEPENDENT_AMBULATORY_CARE_PROVIDER_SITE_OTHER): Payer: Medicare Other | Admitting: Cardiovascular Disease

## 2020-10-02 ENCOUNTER — Encounter: Payer: Self-pay | Admitting: Cardiovascular Disease

## 2020-10-02 ENCOUNTER — Other Ambulatory Visit: Payer: Self-pay

## 2020-10-02 VITALS — BP 122/74 | HR 78 | Ht 70.0 in | Wt 163.0 lb

## 2020-10-02 DIAGNOSIS — E1151 Type 2 diabetes mellitus with diabetic peripheral angiopathy without gangrene: Secondary | ICD-10-CM

## 2020-10-02 DIAGNOSIS — I442 Atrioventricular block, complete: Secondary | ICD-10-CM

## 2020-10-02 DIAGNOSIS — I739 Peripheral vascular disease, unspecified: Secondary | ICD-10-CM

## 2020-10-02 DIAGNOSIS — I714 Abdominal aortic aneurysm, without rupture, unspecified: Secondary | ICD-10-CM

## 2020-10-02 DIAGNOSIS — I5042 Chronic combined systolic (congestive) and diastolic (congestive) heart failure: Secondary | ICD-10-CM

## 2020-10-02 DIAGNOSIS — J449 Chronic obstructive pulmonary disease, unspecified: Secondary | ICD-10-CM

## 2020-10-02 DIAGNOSIS — Z950 Presence of cardiac pacemaker: Secondary | ICD-10-CM

## 2020-10-02 DIAGNOSIS — I471 Supraventricular tachycardia: Secondary | ICD-10-CM

## 2020-10-02 DIAGNOSIS — I10 Essential (primary) hypertension: Secondary | ICD-10-CM

## 2020-10-02 DIAGNOSIS — I25810 Atherosclerosis of coronary artery bypass graft(s) without angina pectoris: Secondary | ICD-10-CM

## 2020-10-02 DIAGNOSIS — E782 Mixed hyperlipidemia: Secondary | ICD-10-CM

## 2020-10-02 LAB — PACEMAKER DEVICE OBSERVATION

## 2020-10-02 NOTE — Progress Notes (Signed)
Patient ID: [name], male   DOB: November 05, 1945, 74 y.o.   MRN: [id number]    Cardiology Office Note    Date:  10/04/2020   ID:  Wesley, Ray 09/02/1946, MRN [id number]  PCP:  Renford Dills, MD  Cardiologist:  [name], MD:  Thurmon Fair, MD   Chief Complaint  Patient presents with   Follow-up   Headache   Chest Pain   Shortness of Breath    History of Present Illness:  [name] is a 74 y.o. male with ischemic cardiomyopathy, chronic systolic and diastolic heart failure, complete heart block, frequent sustained paroxysmal atrial tachycardia and a biventricular pacemaker. He also has advanced chronic obstructive lung disease related to long-standing previous smoking, type 2 diabetes mellitus on oral antidiabetics, small AAA (3 cm by [place] 09/2017), moderate bilateral PAD of lower extremities.  He describes an occasional squeezing/pinching sensation on the left side of his chest, but this last for only a few seconds at a time and occurs at rest, not during physical activity.  It is not at all reminiscent of previous angina pectoris complaints.  Until recently he was walking on a daily basis with his dog, but has slowed down due to his dog's illness.  She is 71 years old.  He does not complain of exertional dyspnea or chest discomfort.  He does not have orthopnea, PND or leg edema.  He occasionally complains of dizziness 1 or 2 seconds after standing up from a sitting or laying position.  He sometimes has to hold onto something to avoid falling.  He has not actually had falls or injuries and denies any bleeding problems.  His wife passed away a few years ago and he lives alone.  Enjoys spending time with his 65 yo grandson.  Device interrogation shows normal function of his Medtronic consult for CRT pacemaker implanted by Dr. Ladona Ridgel in 2015.  Estimated generator longevity is another 1.5 years.  Lead parameters are great.  He has over 99.9% biventricular pacing and never requires  atrial pacing.  He has an excellent heart rate histogram distribution.  His thoracic impedance (OptiVol) was elevated throughout August and October but has now returned to normal.  This has never really seem to correlate well with any symptoms of heart failure.  He has not had ventricular tachycardia or atrial fibrillation.  He is pacemaker dependent without any escape rhythm.  He last underwent evaluation for his small abdominal aortic aneurysm in September 2020 when it measured 3.0 cm, unchanged from previous evaluation.  He also has stable moderate bilateral iliac stenoses that appear to be asymptomatic.  Generally has fair to excellent control of his risk factors.  A very recent hemoglobin A1c was 7.5% (not long ago it was as high as 11%, although it has been as good as 6.8%).  His LDL cholesterol is 68 and his HDL has improved, now up to 42 (was 34).  Continues to have very mild hypertriglyceridemia (181).  He has normal renal function.    He has a long-standing history of coronary disease and underwent bypass surgery in 2007. He had left bundle branch block for years and in 2013 received a dual-chamber permanent pacemaker for complete heart block Just before getting his pacemaker he underwent cardiac catheterization which showed a patent sequential saphenous vein graft bypass to the first and second oblique marginal arteries (both of these vessels had previously been stented and had severe in-stent restenosis).  He has no underlying escape rhythm  and is 100% paced. He requires relatively high doses of beta blockers for control of recurrent paroxysmal atrial tachycardia which was quite symptomatic. Left ventricular ejection fraction had been 45-50% prior to pacemaker implantation. Following implantation of his pacemaker his echocardiogram showed moderately depressed left ventricular systolic function with an ejection fraction of 35-40%. He improved following biventricular pacemaker upgrade, but then  had lead dislodgment and deteriorated again. After repositioning of his left ventricular lead, left ventricular ejection fraction increased to 50-55 %- by his most recent echocardiogram in December 2015.  Attempts at increased ACE inhibitor in the past led to severe symptomatic hypotension.  May 2017 had lower extremity arterial Dopplers showing  >50% right external and left common iliac artery stenosis. 30-49% right SFA disease, without focal stenosis. 50-74% left distal SFA stenosis. Occluded bilateral anterior tibial artery disease, two vessel run-off, bilaterally. Bilateral ABI 1.1-1.26 September 2017 AAA 3.0 cm. September 2020 AAA 3.0 cm   Past Medical History:  Diagnosis Date   Atrial tachycardia (HCC)    Cardiomyopathy (HCC)    mixed ischemic and nonischemic   CHB (complete heart block) (HCC) 02/11/12   Medtronic permanent pacemaker   CHF (congestive heart failure) (HCC)    COPD (chronic obstructive pulmonary disease) (HCC)    Coronary artery disease    DOE (dyspnea on exertion)    NYHA class 2-3   DVT (deep venous thrombosis) (HCC) 12/2005   RLE "had to have OR"   Dyslipidemia    GERD (gastroesophageal reflux disease)    Hypertension    LBBB (left bundle branch block)    Myocardial infarction (HCC)    "I've had 6" (01/03/2014)   Psoriasis    Sleep apnea    c pap at home, does not use   Type II diabetes mellitus (HCC)     Past Surgical History:  Procedure Laterality Date   BI-VENTRICULAR PACEMAKER REVISION N/A 03/06/2014   Procedure: BI-VENTRICULAR PACEMAKER REVISION (CRT-R);  Surgeon: Marinus Maw, MD;  Location: [facility] CATH LAB;  Service: Cardiovascular;  Laterality: N/A;   CARDIAC CATHETERIZATION  02/10/12   left CX: dominant w/40% prox. AV groove stenosis.The first & second marginal branches had stents visualized near the ostium of the vessel bu were functionally occluded. The remainder of the dominant CX had minor irregularities.  RCA: nondominant free  of sign. disease. SVG to OM1 & OM2 sequentially was widely patent.   CATARACT EXTRACTION Left    CORONARY ANGIOPLASTY WITH STENT PLACEMENT     "I had 9 stents before the [facility] in 2007" (01/03/2014)   CORONARY ARTERY BYPASS GRAFT  2007   x 3   ELECTROPHYSIOLOGY STUDY  01/03/2014   EPS by Dr Ladona Ridgel with no inducible ventricular arrhythmias   ELECTROPHYSIOLOGY STUDY N/A 01/03/2014   Procedure: ELECTROPHYSIOLOGY STUDY;  Surgeon: Marinus Maw, MD;  Location: [facility] CATH LAB;  Service: Cardiovascular;  Laterality: N/A;   LEAD REVISION  03-06-2014   LV lead revision by Dr Ladona Ridgel   LEFT HEART CATHETERIZATION WITH CORONARY/GRAFT ANGIOGRAM  02/10/2012   Procedure: LEFT HEART CATHETERIZATION WITH Isabel Caprice;  Surgeon: Runell Gess, MD;  Location: [facility] CATH LAB;  Service: Cardiovascular;;   NM MYOCAR PERF WALL MOTION  10/02/2009   no significant ischemia   PACEMAKER INSERTION  2013; 01/03/2014   MDT dual chamber pacemaker implanted 2013 for complete heart block; upgrade to CRTP (MDT) by Dr Ladona Ridgel 12/2013 due to cardiomyopathy and heart failure   PERMANENT PACEMAKER INSERTION N/A 02/11/2012   Procedure: PERMANENT PACEMAKER  INSERTION;  Surgeon: Thurmon Fair, MD;  Location: [facility] CATH LAB;  Service: Cardiovascular;  Laterality: N/A;   REFRACTIVE SURGERY Right    TEMPORARY PACEMAKER INSERTION Bilateral 02/10/2012   Procedure: TEMPORARY PACEMAKER INSERTION;  Surgeon: Runell Gess, MD;  Location: [facility] CATH LAB;  Service: Cardiovascular;  Laterality: Bilateral;   US ECHOCARDIOGRAPHY  01/10/10   LV systolic fx mod to severely reduced, EF 35-40%, AOV mildly sclerotic    Current Medications: Outpatient Medications Prior to Visit  Medication Sig Dispense Refill   albuterol-ipratropium (COMBIVENT) 18-103 MCG/ACT inhaler Inhale 2 puffs into the lungs every 6 (six) hours as needed for wheezing or shortness of breath.     aspirin EC 81 MG tablet Take 81 mg by mouth daily.     clopidogrel  (PLAVIX) 75 MG tablet TAKE 1 TABLET BY MOUTH  DAILY 90 tablet 2   furosemide (LASIX) 20 MG tablet TAKE 1 TABLET BY MOUTH  DAILY 90 tablet 3   glipiZIDE (GLUCOTROL XL) 5 MG 24 hr tablet Take 5 mg by mouth daily.     isosorbide mononitrate (IMDUR) 30 MG 24 hr tablet TAKE 1 TABLET BY MOUTH  DAILY 90 tablet 3   lisinopril (ZESTRIL) 2.5 MG tablet TAKE 1 TABLET BY MOUTH  DAILY 90 tablet 3   metFORMIN (GLUCOPHAGE) 1000 MG tablet Take 1 tablet (1,000 mg total) by mouth 2 (two) times daily with a meal. HOLD for 2 days. Restart 03/10/2014.     metoprolol tartrate (LOPRESSOR) 100 MG tablet TAKE 1 TABLET BY MOUTH TWO  TIMES DAILY 180 tablet 2   nitroGLYCERIN (NITROSTAT) 0.4 MG SL tablet Place 1 tablet (0.4 mg total) under the tongue every 5 (five) minutes as needed for chest pain. 25 tablet 6   pantoprazole (PROTONIX) 40 MG tablet Take 40 mg by mouth daily as needed (acid reflux).      pioglitazone (ACTOS) 15 MG tablet Take 15 mg by mouth daily.     rosuvastatin (CRESTOR) 10 MG tablet TAKE 1 TABLET BY MOUTH  DAILY 90 tablet 3   No facility-administered medications prior to visit.     Allergies:   Pollen extract   Social History   Socioeconomic History   Marital status: Married    Spouse name: Not on file   Number of children: Not on file   Years of education: Not on file   Highest education level: Not on file  Occupational History   Not on file  Tobacco Use   Smoking status: Former Smoker    Packs/day: 15.00    Years: 36.00    Pack years: 540.00    Types: Cigars, Cigarettes    Quit date: 05/25/2005    Years since quitting: 15.3   Smokeless tobacco: Never Used   Tobacco comment: 15 cigars/daily when smoking  Substance and Sexual Activity   Alcohol use: Yes    Alcohol/week: 0.0 standard drinks    Comment: "quit alcohol in 1985 or 1986"   Drug use: No   Sexual activity: Not Currently  Other Topics Concern   Not on file  Social History Narrative   Not on file    Social Determinants of Health   Financial Resource Strain: Not on file  Food Insecurity: Not on file  Transportation Needs: Not on file  Physical Activity: Not on file  Stress: Not on file  Social Connections: Not on file     Family History:  The patient's family history includes Cancer in his father.   ROS:  Please see the history of present illness.    ROS All other systems are reviewed and are negative.  PHYSICAL EXAM:   VS:  BP 122/74 (BP Location: Left Arm, Patient Position: Sitting, Cuff Size: Normal)    Pulse 78    Ht 5\' 10"  (1.778 m)    Wt 163 lb (73.9 kg)    BMI 23.39 kg/m      General: Alert, oriented x3, no distress, appears well and younger than stated age.  Healthy left subclavian pacemaker site Head: no evidence of trauma, PERRL, EOMI, no exophtalmos or lid lag, no myxedema, no xanthelasma; normal ears, nose and oropharynx Neck: normal jugular venous pulsations and no hepatojugular reflux; brisk carotid pulses without delay and no carotid bruits Chest: clear to auscultation, no signs of consolidation by percussion or palpation, normal fremitus, symmetrical and full respiratory excursions Cardiovascular: normal position and quality of the apical impulse, regular rhythm, normal first and paradoxically split second heart sounds, no murmurs, rubs or gallops Abdomen: no tenderness or distention, no masses by palpation, no abnormal pulsatility or arterial bruits, normal bowel sounds, no hepatosplenomegaly Extremities: no clubbing, cyanosis or edema; 2+ radial, ulnar and brachial pulses bilaterally; 1+ pedal pulses;   He has a few psoriatic plaques mostly on his elbows and forearms. Neurological: grossly nonfocal Psych: Normal mood and affect    Wt Readings from Last 3 Encounters:  10/02/20 163 lb (73.9 kg)  04/25/19 156 lb 3.2 oz (70.9 kg)  03/16/18 161 lb 12.8 oz (73.4 kg)      Studies/Labs Reviewed:   EKG:  EKG is ordered today.  Shows atrial sensed,  biventricular paced rhythm with a very tall R wave in lead V1.  The QRS is broader than 64 ms, normal QTC 503 ms. Recent Labs:  09/27/2020 Hemoglobin 14.8, creatinine 0.79, potassium 4.9, normal liver function tests, hemoglobin A1c 7.5%   Lipid Panel  09/27/2020 Total cholesterol 141, HDL 42, LDL 68, triglycerides 181   ASSESSMENT:    1. Claudication in peripheral vascular disease (HCC)   2. Chronic combined systolic and diastolic CHF, NYHA class 3 (HCC)   3. AAA (abdominal aortic aneurysm) without rupture (HCC)      PLAN:  In order of problems listed above:  1. CHF: NYHA functional class I, appears to be clinically euvolemic on a very low-dose of loop diuretic.  He has recovered left ventricular ejection fraction following CRT on ACE inhibitor (did not tolerate higher doses due to symptomatic hypotension) and high-dose beta-blocker).  Thoracic impedance does not appear to correlate too well with his symptoms.  On beta-blockers and ACE inhibitors, previous attempts at increasing the dose of ACE inhibitor led to symptomatic hypotension.  Unlikely to tolerate Entresto.  Seems to be maintaining normal volume status in spite of treatment with pioglitazone.  Low threshold to discontinue this if he develops heart failure. 2. CHB: He does not have escape rhythm.  Pacemaker dependent. 3. HTN: Excellent control, actually has some symptoms of orthostatic hypotension. 4. PAT: Many years have passed without any documentation of atrial fibrillation.  On aspirin and clopidogrel, not on anticoagulation. 5. CAD: Known inferior scar by previous nuclear stress testing, not performed since 2015.  Has not required cardiac catheterization since 2013. 6. COPD: On bronchodilators.  No wheezing noted today. 7. CRT-P: Normal device function, continue downloads every 3 months. Device followed by Dr. [name] 8. HLP: All lipid parameters are in desirable range and have actually improved since last year. 9. DM,  complicated  by PAD: He is an excellent candidate for Jardiance or Farxiga, but cannot afford this medication.  Since his heart failure so well compensated, we will not make any changes at this time. 10. PAD: Extensive infrapopliteal disease as well as iliac artery stenoses.  He does not have claudication at this time. 11. AAA: Infrarenal fusiform dilation of the distal aorta last measured 3 cm, unchanged over the last 3 years.      Medication Adjustments/Labs and Tests Ordered: Current medicines are reviewed at length with the patient today.  Concerns regarding medicines are outlined above.  Medication changes, Labs and Tests ordered today are listed in the Patient Instructions below. Patient Instructions   Testing/Procedures:  Your physician has requested that you have a lower extremity arterial exercise duplex. During this test, exercise and ultrasound are used to evaluate arterial blood flow in the legs. Allow one hour for this exam. There are no restrictions or special instructions. [facility] OFFICE  Your physician has requested that you have an abdominal aorta duplex. During this test, an ultrasound is used to evaluate the aorta. Allow 30 minutes for this exam. Do not eat after midnight the day before and avoid carbonated beverages [facility]  AORTA/ILIAC/IVS DOPPLERS AT THE [facility] OFFICE   Follow-Up: At [facility], you and your health needs are our priority.  As part of our continuing mission to provide you with exceptional heart care, we have created designated Provider Care Teams.  These Care Teams include your primary Cardiologist (physician) and Advanced Practice Providers (APPs -  Physician Assistants and Nurse Practitioners) who all work together to provide you with the care you need, when you need it.  We recommend signing up for the patient portal called "MyChart".  Sign up information is provided on this After Visit Summary.  MyChart is used to connect with  patients for Virtual Visits (Telemedicine).  Patients are able to view lab/test results, encounter notes, upcoming appointments, etc.  Non-urgent messages can be sent to your provider as well.   To learn more about what you can do with MyChart, go to [email or web address].    Your next appointment:   12 month(s)  The format for your next appointment:   In Person  Provider:   Thurmon Fair, MD      Signed, Thurmon Fair, MD  10/04/2020 1:59 PM    [facility] [address] [place], [place], [place]  [address] Phone: [phone]; Fax: [phone]

## 2020-10-02 NOTE — Patient Instructions (Signed)
  Testing/Procedures:  Your physician has requested that you have a lower extremity arterial exercise duplex. During this test, exercise and ultrasound are used to evaluate arterial blood flow in the legs. Allow one hour for this exam. There are no restrictions or special instructions. [facility] OFFICE  Your physician has requested that you have an abdominal aorta duplex. During this test, an ultrasound is used to evaluate the aorta. Allow 30 minutes for this exam. Do not eat after midnight the day before and avoid carbonated beverages [facility]  AORTA/ILIAC/IVS DOPPLERS AT THE [facility] OFFICE   Follow-Up: At [facility], you and your health needs are our priority.  As part of our continuing mission to provide you with exceptional heart care, we have created designated Provider Care Teams.  These Care Teams include your primary Cardiologist (physician) and Advanced Practice Providers (APPs -  Physician Assistants and Nurse Practitioners) who all work together to provide you with the care you need, when you need it.  We recommend signing up for the patient portal called "MyChart".  Sign up information is provided on this After Visit Summary.  MyChart is used to connect with patients for Virtual Visits (Telemedicine).  Patients are able to view lab/test results, encounter notes, upcoming appointments, etc.  Non-urgent messages can be sent to your provider as well.   To learn more about what you can do with MyChart, go to [email or web address].    Your next appointment:   12 month(s)  The format for your next appointment:   In Person  Provider:   Thurmon Fair, MD

## 2020-10-31 ENCOUNTER — Ambulatory Visit (HOSPITAL_COMMUNITY)
Admission: RE | Admit: 2020-10-31 | Payer: Medicare Other | Source: Ambulatory Visit | Attending: Cardiovascular Disease | Admitting: Cardiovascular Disease

## 2020-10-31 ENCOUNTER — Ambulatory Visit (HOSPITAL_COMMUNITY): Admission: RE | Admit: 2020-10-31 | Payer: Medicare Other | Source: Ambulatory Visit

## 2020-11-05 ENCOUNTER — Ambulatory Visit (INDEPENDENT_AMBULATORY_CARE_PROVIDER_SITE_OTHER): Payer: Medicare Other

## 2020-11-05 DIAGNOSIS — I442 Atrioventricular block, complete: Secondary | ICD-10-CM | POA: Diagnosis not present

## 2020-11-07 LAB — CUP PACEART REMOTE DEVICE CHECK
Battery Remaining Longevity: 17 mo
Battery Voltage: 2.9 V
Brady Statistic AP VP Percent: 20.13 %
Brady Statistic AP VS Percent: 0 %
Brady Statistic AS VP Percent: 79.84 %
Brady Statistic AS VS Percent: 0.03 %
Brady Statistic RA Percent Paced: 19.77 %
Brady Statistic RV Percent Paced: 99.92 %
Date Time Interrogation Session: 20220113005534
Implantable Lead Implant Date: 20130418
Implantable Lead Implant Date: 20130418
Implantable Lead Implant Date: 20150312
Implantable Lead Location: 753858
Implantable Lead Location: 753859
Implantable Lead Location: 753860
Implantable Pulse Generator Implant Date: 20150311
Lead Channel Impedance Value: 380 Ohm
Lead Channel Impedance Value: 4047 Ohm
Lead Channel Impedance Value: 4047 Ohm
Lead Channel Impedance Value: 4047 Ohm
Lead Channel Impedance Value: 437 Ohm
Lead Channel Impedance Value: 494 Ohm
Lead Channel Impedance Value: 494 Ohm
Lead Channel Impedance Value: 570 Ohm
Lead Channel Impedance Value: 608 Ohm
Lead Channel Pacing Threshold Amplitude: 0.5 V
Lead Channel Pacing Threshold Amplitude: 0.875 V
Lead Channel Pacing Threshold Pulse Width: 0.4 ms
Lead Channel Pacing Threshold Pulse Width: 0.4 ms
Lead Channel Sensing Intrinsic Amplitude: 18.25 mV
Lead Channel Sensing Intrinsic Amplitude: 18.25 mV
Lead Channel Sensing Intrinsic Amplitude: 3.125 mV
Lead Channel Sensing Intrinsic Amplitude: 3.125 mV
Lead Channel Setting Pacing Amplitude: 2 V
Lead Channel Setting Pacing Amplitude: 2 V
Lead Channel Setting Pacing Amplitude: 2.5 V
Lead Channel Setting Pacing Pulse Width: 0.4 ms
Lead Channel Setting Pacing Pulse Width: 0.8 ms
Lead Channel Setting Sensing Sensitivity: 5.6 mV

## 2020-11-08 ENCOUNTER — Ambulatory Visit: Payer: Medicare Other | Admitting: Cardiovascular Disease

## 2020-11-13 ENCOUNTER — Ambulatory Visit: Payer: Medicare Other | Admitting: Cardiovascular Disease

## 2020-11-20 ENCOUNTER — Ambulatory Visit (HOSPITAL_BASED_OUTPATIENT_CLINIC_OR_DEPARTMENT_OTHER)
Admission: RE | Admit: 2020-11-20 | Discharge: 2020-11-20 | Disposition: A | Discharge status: Home / Self Care | Payer: Medicare Other | Source: Ambulatory Visit | Attending: Cardiovascular Disease | Admitting: Cardiovascular Disease

## 2020-11-20 ENCOUNTER — Ambulatory Visit (HOSPITAL_COMMUNITY)
Admission: RE | Admit: 2020-11-20 | Discharge: 2020-11-20 | Disposition: A | Discharge status: Home / Self Care | Payer: Medicare Other | Source: Ambulatory Visit | Attending: Cardiovascular Disease | Admitting: Cardiovascular Disease

## 2020-11-20 ENCOUNTER — Other Ambulatory Visit: Payer: Self-pay | Admitting: Cardiovascular Disease

## 2020-11-20 ENCOUNTER — Other Ambulatory Visit: Payer: Self-pay

## 2020-11-20 DIAGNOSIS — I714 Abdominal aortic aneurysm, without rupture, unspecified: Secondary | ICD-10-CM

## 2020-11-20 DIAGNOSIS — I739 Peripheral vascular disease, unspecified: Secondary | ICD-10-CM | POA: Insufficient documentation

## 2020-11-21 NOTE — Progress Notes (Signed)
Remote pacemaker transmission.   

## 2021-03-07 DIAGNOSIS — E119 Type 2 diabetes mellitus without complications: Secondary | ICD-10-CM | POA: Diagnosis not present

## 2021-04-03 DIAGNOSIS — E119 Type 2 diabetes mellitus without complications: Secondary | ICD-10-CM | POA: Diagnosis not present

## 2021-04-03 DIAGNOSIS — H2511 Age-related nuclear cataract, right eye: Secondary | ICD-10-CM | POA: Diagnosis not present

## 2021-04-03 DIAGNOSIS — Z961 Presence of intraocular lens: Secondary | ICD-10-CM | POA: Diagnosis not present

## 2021-04-03 DIAGNOSIS — H43813 Vitreous degeneration, bilateral: Secondary | ICD-10-CM | POA: Diagnosis not present

## 2021-04-16 DIAGNOSIS — Z950 Presence of cardiac pacemaker: Secondary | ICD-10-CM | POA: Diagnosis not present

## 2021-04-16 DIAGNOSIS — E7800 Pure hypercholesterolemia, unspecified: Secondary | ICD-10-CM | POA: Diagnosis not present

## 2021-04-16 DIAGNOSIS — I2510 Atherosclerotic heart disease of native coronary artery without angina pectoris: Secondary | ICD-10-CM | POA: Diagnosis not present

## 2021-04-16 DIAGNOSIS — I10 Essential (primary) hypertension: Secondary | ICD-10-CM | POA: Diagnosis not present

## 2021-04-16 DIAGNOSIS — Z0000 Encounter for general adult medical examination without abnormal findings: Secondary | ICD-10-CM | POA: Diagnosis not present

## 2021-04-16 DIAGNOSIS — I739 Peripheral vascular disease, unspecified: Secondary | ICD-10-CM | POA: Diagnosis not present

## 2021-04-16 DIAGNOSIS — G4733 Obstructive sleep apnea (adult) (pediatric): Secondary | ICD-10-CM | POA: Diagnosis not present

## 2021-04-16 DIAGNOSIS — Z7984 Long term (current) use of oral hypoglycemic drugs: Secondary | ICD-10-CM | POA: Diagnosis not present

## 2021-04-16 DIAGNOSIS — E1140 Type 2 diabetes mellitus with diabetic neuropathy, unspecified: Secondary | ICD-10-CM | POA: Diagnosis not present

## 2021-04-16 DIAGNOSIS — I5042 Chronic combined systolic (congestive) and diastolic (congestive) heart failure: Secondary | ICD-10-CM | POA: Diagnosis not present

## 2021-04-16 DIAGNOSIS — J449 Chronic obstructive pulmonary disease, unspecified: Secondary | ICD-10-CM | POA: Diagnosis not present

## 2021-04-16 DIAGNOSIS — Z1389 Encounter for screening for other disorder: Secondary | ICD-10-CM | POA: Diagnosis not present

## 2021-04-30 DIAGNOSIS — D72829 Elevated white blood cell count, unspecified: Secondary | ICD-10-CM | POA: Diagnosis not present

## 2021-05-06 ENCOUNTER — Ambulatory Visit (INDEPENDENT_AMBULATORY_CARE_PROVIDER_SITE_OTHER): Payer: Medicare Other

## 2021-05-06 DIAGNOSIS — I442 Atrioventricular block, complete: Secondary | ICD-10-CM | POA: Diagnosis not present

## 2021-05-08 LAB — CUP PACEART REMOTE DEVICE CHECK
Battery Remaining Longevity: 12 mo
Battery Voltage: 2.87 V
Brady Statistic AP VP Percent: 13.21 %
Brady Statistic AP VS Percent: 0 %
Brady Statistic AS VP Percent: 86.75 %
Brady Statistic AS VS Percent: 0.03 %
Brady Statistic RA Percent Paced: 13.09 %
Brady Statistic RV Percent Paced: 99.88 %
Date Time Interrogation Session: 20220713213439
Implantable Lead Implant Date: 20130418
Implantable Lead Implant Date: 20130418
Implantable Lead Implant Date: 20150312
Implantable Lead Location: 753858
Implantable Lead Location: 753859
Implantable Lead Location: 753860
Implantable Pulse Generator Implant Date: 20150311
Lead Channel Impedance Value: 380 Ohm
Lead Channel Impedance Value: 4047 Ohm
Lead Channel Impedance Value: 4047 Ohm
Lead Channel Impedance Value: 4047 Ohm
Lead Channel Impedance Value: 456 Ohm
Lead Channel Impedance Value: 494 Ohm
Lead Channel Impedance Value: 494 Ohm
Lead Channel Impedance Value: 570 Ohm
Lead Channel Impedance Value: 627 Ohm
Lead Channel Pacing Threshold Amplitude: 0.5 V
Lead Channel Pacing Threshold Amplitude: 0.875 V
Lead Channel Pacing Threshold Pulse Width: 0.4 ms
Lead Channel Pacing Threshold Pulse Width: 0.4 ms
Lead Channel Sensing Intrinsic Amplitude: 17.75 mV
Lead Channel Sensing Intrinsic Amplitude: 17.75 mV
Lead Channel Sensing Intrinsic Amplitude: 2.875 mV
Lead Channel Sensing Intrinsic Amplitude: 2.875 mV
Lead Channel Setting Pacing Amplitude: 2 V
Lead Channel Setting Pacing Amplitude: 2 V
Lead Channel Setting Pacing Amplitude: 2.5 V
Lead Channel Setting Pacing Pulse Width: 0.4 ms
Lead Channel Setting Pacing Pulse Width: 0.8 ms
Lead Channel Setting Sensing Sensitivity: 5.6 mV

## 2021-05-19 ENCOUNTER — Other Ambulatory Visit: Payer: Self-pay | Admitting: Cardiovascular Disease

## 2021-05-29 NOTE — Progress Notes (Signed)
Remote pacemaker transmission.   

## 2021-06-04 DIAGNOSIS — H2511 Age-related nuclear cataract, right eye: Secondary | ICD-10-CM | POA: Diagnosis not present

## 2021-06-06 DIAGNOSIS — H25811 Combined forms of age-related cataract, right eye: Secondary | ICD-10-CM | POA: Diagnosis not present

## 2021-06-06 DIAGNOSIS — H2511 Age-related nuclear cataract, right eye: Secondary | ICD-10-CM | POA: Diagnosis not present

## 2021-10-09 DIAGNOSIS — G4733 Obstructive sleep apnea (adult) (pediatric): Secondary | ICD-10-CM | POA: Diagnosis not present

## 2021-10-09 DIAGNOSIS — E1165 Type 2 diabetes mellitus with hyperglycemia: Secondary | ICD-10-CM | POA: Diagnosis not present

## 2021-10-09 DIAGNOSIS — I10 Essential (primary) hypertension: Secondary | ICD-10-CM | POA: Diagnosis not present

## 2021-10-09 DIAGNOSIS — I2510 Atherosclerotic heart disease of native coronary artery without angina pectoris: Secondary | ICD-10-CM | POA: Diagnosis not present

## 2021-10-09 DIAGNOSIS — I5042 Chronic combined systolic (congestive) and diastolic (congestive) heart failure: Secondary | ICD-10-CM | POA: Diagnosis not present

## 2021-10-09 DIAGNOSIS — E1152 Type 2 diabetes mellitus with diabetic peripheral angiopathy with gangrene: Secondary | ICD-10-CM | POA: Diagnosis not present

## 2021-10-09 DIAGNOSIS — Z7984 Long term (current) use of oral hypoglycemic drugs: Secondary | ICD-10-CM | POA: Diagnosis not present

## 2021-10-09 DIAGNOSIS — Z23 Encounter for immunization: Secondary | ICD-10-CM | POA: Diagnosis not present

## 2021-10-09 DIAGNOSIS — I739 Peripheral vascular disease, unspecified: Secondary | ICD-10-CM | POA: Diagnosis not present

## 2021-10-09 DIAGNOSIS — J449 Chronic obstructive pulmonary disease, unspecified: Secondary | ICD-10-CM | POA: Diagnosis not present

## 2021-10-09 DIAGNOSIS — Z950 Presence of cardiac pacemaker: Secondary | ICD-10-CM | POA: Diagnosis not present

## 2021-10-09 DIAGNOSIS — E1140 Type 2 diabetes mellitus with diabetic neuropathy, unspecified: Secondary | ICD-10-CM | POA: Diagnosis not present

## 2021-10-09 DIAGNOSIS — L237 Allergic contact dermatitis due to plants, except food: Secondary | ICD-10-CM | POA: Diagnosis not present

## 2021-11-04 ENCOUNTER — Ambulatory Visit (INDEPENDENT_AMBULATORY_CARE_PROVIDER_SITE_OTHER): Payer: Medicare Other

## 2021-11-04 DIAGNOSIS — I442 Atrioventricular block, complete: Secondary | ICD-10-CM | POA: Diagnosis not present

## 2021-11-05 ENCOUNTER — Telehealth: Payer: Self-pay

## 2021-11-05 LAB — CUP PACEART REMOTE DEVICE CHECK
Battery Remaining Longevity: 5 mo
Battery Voltage: 2.84 V
Brady Statistic AP VP Percent: 19.22 %
Brady Statistic AP VS Percent: 0 %
Brady Statistic AS VP Percent: 80.68 %
Brady Statistic AS VS Percent: 0.1 %
Brady Statistic RA Percent Paced: 19.03 %
Brady Statistic RV Percent Paced: 99.72 %
Date Time Interrogation Session: 20230111163210
Implantable Lead Implant Date: 20130418
Implantable Lead Implant Date: 20130418
Implantable Lead Implant Date: 20150312
Implantable Lead Location: 753858
Implantable Lead Location: 753859
Implantable Lead Location: 753860
Implantable Pulse Generator Implant Date: 20150311
Lead Channel Impedance Value: 380 Ohm
Lead Channel Impedance Value: 4047 Ohm
Lead Channel Impedance Value: 4047 Ohm
Lead Channel Impedance Value: 4047 Ohm
Lead Channel Impedance Value: 437 Ohm
Lead Channel Impedance Value: 475 Ohm
Lead Channel Impedance Value: 494 Ohm
Lead Channel Impedance Value: 570 Ohm
Lead Channel Impedance Value: 627 Ohm
Lead Channel Pacing Threshold Amplitude: 0.5 V
Lead Channel Pacing Threshold Amplitude: 0.875 V
Lead Channel Pacing Threshold Pulse Width: 0.4 ms
Lead Channel Pacing Threshold Pulse Width: 0.4 ms
Lead Channel Sensing Intrinsic Amplitude: 17.75 mV
Lead Channel Sensing Intrinsic Amplitude: 17.75 mV
Lead Channel Sensing Intrinsic Amplitude: 2.25 mV
Lead Channel Sensing Intrinsic Amplitude: 2.25 mV
Lead Channel Setting Pacing Amplitude: 2 V
Lead Channel Setting Pacing Amplitude: 2 V
Lead Channel Setting Pacing Amplitude: 2.5 V
Lead Channel Setting Pacing Pulse Width: 0.4 ms
Lead Channel Setting Pacing Pulse Width: 0.8 ms
Lead Channel Setting Sensing Sensitivity: 5.6 mV

## 2021-11-05 NOTE — Telephone Encounter (Signed)
Scheduled remote reviewed. Normal device function.   2 NSVT, 10 & 12 beats 2 AF, 17-40sec in duration.  Hx of PAF, burden <0.1%.  No OAC, DAPT and Metoprolol prescribed Battery estimated 53mo Next remote to be determined Route to triage LA  Successful telephone encounter to patient to discuss need for monthly battery checks. Patient appreciative of call. Scheduled in Epic. Patient will be notified once device reaches RRT.

## 2021-11-13 NOTE — Progress Notes (Signed)
Remote pacemaker transmission.   

## 2021-12-05 ENCOUNTER — Ambulatory Visit (INDEPENDENT_AMBULATORY_CARE_PROVIDER_SITE_OTHER): Payer: Medicare Other

## 2021-12-05 DIAGNOSIS — I442 Atrioventricular block, complete: Secondary | ICD-10-CM

## 2021-12-06 LAB — CUP PACEART REMOTE DEVICE CHECK
Battery Remaining Longevity: 4 mo
Battery Voltage: 2.83 V
Brady Statistic AP VP Percent: 18.56 %
Brady Statistic AP VS Percent: 0 %
Brady Statistic AS VP Percent: 81.41 %
Brady Statistic AS VS Percent: 0.03 %
Brady Statistic RA Percent Paced: 18.41 %
Brady Statistic RV Percent Paced: 99.84 %
Date Time Interrogation Session: 20230211023217
Implantable Lead Implant Date: 20130418
Implantable Lead Implant Date: 20130418
Implantable Lead Implant Date: 20150312
Implantable Lead Location: 753858
Implantable Lead Location: 753859
Implantable Lead Location: 753860
Implantable Pulse Generator Implant Date: 20150311
Lead Channel Impedance Value: 380 Ohm
Lead Channel Impedance Value: 4047 Ohm
Lead Channel Impedance Value: 4047 Ohm
Lead Channel Impedance Value: 4047 Ohm
Lead Channel Impedance Value: 437 Ohm
Lead Channel Impedance Value: 494 Ohm
Lead Channel Impedance Value: 494 Ohm
Lead Channel Impedance Value: 570 Ohm
Lead Channel Impedance Value: 627 Ohm
Lead Channel Pacing Threshold Amplitude: 0.5 V
Lead Channel Pacing Threshold Amplitude: 0.875 V
Lead Channel Pacing Threshold Pulse Width: 0.4 ms
Lead Channel Pacing Threshold Pulse Width: 0.4 ms
Lead Channel Sensing Intrinsic Amplitude: 17.75 mV
Lead Channel Sensing Intrinsic Amplitude: 17.75 mV
Lead Channel Sensing Intrinsic Amplitude: 2.75 mV
Lead Channel Sensing Intrinsic Amplitude: 2.75 mV
Lead Channel Setting Pacing Amplitude: 2 V
Lead Channel Setting Pacing Amplitude: 2 V
Lead Channel Setting Pacing Amplitude: 2.5 V
Lead Channel Setting Pacing Pulse Width: 0.4 ms
Lead Channel Setting Pacing Pulse Width: 0.8 ms
Lead Channel Setting Sensing Sensitivity: 5.6 mV

## 2021-12-09 NOTE — Progress Notes (Signed)
Remote pacemaker transmission.   

## 2021-12-09 NOTE — Addendum Note (Signed)
Addended by: Elease Etienne A on: 12/09/2021 03:23 PM   Modules accepted: Level of Service

## 2021-12-23 ENCOUNTER — Telehealth: Payer: Self-pay

## 2021-12-23 NOTE — Telephone Encounter (Signed)
The patient called because he got the 2300 error code. I had him unplug the monitor and plug it back in. It still gave him the error code 2300. I called medtronic tech support for additional help. The patient is receiving a new monitor. It will take 7-10 business days.

## 2022-01-05 ENCOUNTER — Ambulatory Visit (INDEPENDENT_AMBULATORY_CARE_PROVIDER_SITE_OTHER): Payer: Medicare Other

## 2022-01-05 DIAGNOSIS — I442 Atrioventricular block, complete: Secondary | ICD-10-CM

## 2022-01-06 LAB — CUP PACEART REMOTE DEVICE CHECK
Battery Remaining Longevity: 4 mo
Battery Voltage: 2.82 V
Brady Statistic AP VP Percent: 11.75 %
Brady Statistic AP VS Percent: 0 %
Brady Statistic AS VP Percent: 88.1 %
Brady Statistic AS VS Percent: 0.15 %
Brady Statistic RA Percent Paced: 11.71 %
Brady Statistic RV Percent Paced: 99.59 %
Date Time Interrogation Session: 20230314160843
Implantable Lead Implant Date: 20130418
Implantable Lead Implant Date: 20130418
Implantable Lead Implant Date: 20150312
Implantable Lead Location: 753858
Implantable Lead Location: 753859
Implantable Lead Location: 753860
Implantable Pulse Generator Implant Date: 20150311
Lead Channel Impedance Value: 361 Ohm
Lead Channel Impedance Value: 4047 Ohm
Lead Channel Impedance Value: 4047 Ohm
Lead Channel Impedance Value: 4047 Ohm
Lead Channel Impedance Value: 418 Ohm
Lead Channel Impedance Value: 475 Ohm
Lead Channel Impedance Value: 494 Ohm
Lead Channel Impedance Value: 532 Ohm
Lead Channel Impedance Value: 589 Ohm
Lead Channel Pacing Threshold Amplitude: 0.5 V
Lead Channel Pacing Threshold Amplitude: 0.875 V
Lead Channel Pacing Threshold Pulse Width: 0.4 ms
Lead Channel Pacing Threshold Pulse Width: 0.4 ms
Lead Channel Sensing Intrinsic Amplitude: 17.75 mV
Lead Channel Sensing Intrinsic Amplitude: 17.75 mV
Lead Channel Sensing Intrinsic Amplitude: 2.625 mV
Lead Channel Sensing Intrinsic Amplitude: 2.625 mV
Lead Channel Setting Pacing Amplitude: 2 V
Lead Channel Setting Pacing Amplitude: 2 V
Lead Channel Setting Pacing Amplitude: 2.5 V
Lead Channel Setting Pacing Pulse Width: 0.4 ms
Lead Channel Setting Pacing Pulse Width: 0.8 ms
Lead Channel Setting Sensing Sensitivity: 5.6 mV

## 2022-01-08 ENCOUNTER — Telehealth: Payer: Self-pay | Admitting: Cardiovascular Disease

## 2022-01-08 NOTE — Telephone Encounter (Signed)
Spoke with patient regarding chest pains he had when he woke up this am ?Stated he has had multiple heart attacks and the pain was worse than that.  ?Denied chest pain but continues to have shortness of breath ?When he woke he had to go outside because pain so bad and could not catch his breath.  ?Advised patient multiple times he needed to hang up and call 911.  ?Patient stated if he went to the hospital they would want to keep him and he could not stay.  ?Encouraged patient multiple times he needed to go to ED  ?Patient stated he would call his son to come get him and take him to hospital.  ?Again advised patient to call 911 instead of having son take him.  ?

## 2022-01-08 NOTE — Telephone Encounter (Signed)
Pt c/o of Chest Pain: STAT if CP now or developed within 24 hours ? ?1. Are you having CP right now? Yes, but not as bad ? ?2. Are you experiencing any other symptoms (ex. SOB, nausea, vomiting, sweating)? Sweating, SOB ? ?3. How long have you been experiencing CP? This morning ? ?4. Is your CP continuous or coming and going? continuous ? ?5. Have you taken Nitroglycerin? no ? ? ?Patient states he started having very bad chest pain this morning. HE says it was so bad that he had to go outside in the cold. He says his mouth was also very dry. He says he still has the pain, but it is not as bad.  ? ? ??  ?

## 2022-01-16 NOTE — Addendum Note (Signed)
Addended by: Elease Etienne A on: 01/16/2022 12:35 PM ? ? Modules accepted: Level of Service ? ?

## 2022-01-16 NOTE — Progress Notes (Signed)
Remote pacemaker transmission.   

## 2022-02-05 ENCOUNTER — Ambulatory Visit (INDEPENDENT_AMBULATORY_CARE_PROVIDER_SITE_OTHER): Payer: Medicare Other

## 2022-02-05 DIAGNOSIS — I442 Atrioventricular block, complete: Secondary | ICD-10-CM

## 2022-02-06 LAB — CUP PACEART REMOTE DEVICE CHECK
Battery Remaining Longevity: 3 mo
Battery Voltage: 2.81 V
Brady Statistic AP VP Percent: 5.44 %
Brady Statistic AP VS Percent: 0 %
Brady Statistic AS VP Percent: 94.41 %
Brady Statistic AS VS Percent: 0.16 %
Brady Statistic RA Percent Paced: 5.42 %
Brady Statistic RV Percent Paced: 99.5 %
Date Time Interrogation Session: 20230414155400
Implantable Lead Implant Date: 20130418
Implantable Lead Implant Date: 20130418
Implantable Lead Implant Date: 20150312
Implantable Lead Location: 753858
Implantable Lead Location: 753859
Implantable Lead Location: 753860
Implantable Pulse Generator Implant Date: 20150311
Lead Channel Impedance Value: 399 Ohm
Lead Channel Impedance Value: 4047 Ohm
Lead Channel Impedance Value: 4047 Ohm
Lead Channel Impedance Value: 4047 Ohm
Lead Channel Impedance Value: 513 Ohm
Lead Channel Impedance Value: 532 Ohm
Lead Channel Impedance Value: 570 Ohm
Lead Channel Impedance Value: 589 Ohm
Lead Channel Impedance Value: 646 Ohm
Lead Channel Pacing Threshold Amplitude: 0.5 V
Lead Channel Pacing Threshold Amplitude: 0.75 V
Lead Channel Pacing Threshold Pulse Width: 0.4 ms
Lead Channel Pacing Threshold Pulse Width: 0.4 ms
Lead Channel Sensing Intrinsic Amplitude: 18.75 mV
Lead Channel Sensing Intrinsic Amplitude: 18.75 mV
Lead Channel Sensing Intrinsic Amplitude: 3 mV
Lead Channel Sensing Intrinsic Amplitude: 3 mV
Lead Channel Setting Pacing Amplitude: 2 V
Lead Channel Setting Pacing Amplitude: 2 V
Lead Channel Setting Pacing Amplitude: 2.5 V
Lead Channel Setting Pacing Pulse Width: 0.4 ms
Lead Channel Setting Pacing Pulse Width: 0.8 ms
Lead Channel Setting Sensing Sensitivity: 5.6 mV

## 2022-02-23 NOTE — Progress Notes (Signed)
Remote pacemaker transmission.   

## 2022-02-23 NOTE — Addendum Note (Signed)
Addended by: Geralyn Flash D on: 02/23/2022 01:15 PM ? ? Modules accepted: Level of Service ? ?

## 2022-03-09 ENCOUNTER — Ambulatory Visit (INDEPENDENT_AMBULATORY_CARE_PROVIDER_SITE_OTHER): Payer: Medicare Other

## 2022-03-09 DIAGNOSIS — I442 Atrioventricular block, complete: Secondary | ICD-10-CM

## 2022-03-10 LAB — CUP PACEART REMOTE DEVICE CHECK
Battery Remaining Longevity: 3 mo
Battery Voltage: 2.81 V
Brady Statistic AP VP Percent: 6.24 %
Brady Statistic AP VS Percent: 0 %
Brady Statistic AS VP Percent: 93.6 %
Brady Statistic AS VS Percent: 0.16 %
Brady Statistic RA Percent Paced: 6.23 %
Brady Statistic RV Percent Paced: 99.65 %
Date Time Interrogation Session: 20230515163127
Implantable Lead Implant Date: 20130418
Implantable Lead Implant Date: 20130418
Implantable Lead Implant Date: 20150312
Implantable Lead Location: 753858
Implantable Lead Location: 753859
Implantable Lead Location: 753860
Implantable Pulse Generator Implant Date: 20150311
Lead Channel Impedance Value: 399 Ohm
Lead Channel Impedance Value: 4047 Ohm
Lead Channel Impedance Value: 4047 Ohm
Lead Channel Impedance Value: 4047 Ohm
Lead Channel Impedance Value: 475 Ohm
Lead Channel Impedance Value: 532 Ohm
Lead Channel Impedance Value: 532 Ohm
Lead Channel Impedance Value: 608 Ohm
Lead Channel Impedance Value: 665 Ohm
Lead Channel Pacing Threshold Amplitude: 0.5 V
Lead Channel Pacing Threshold Amplitude: 0.75 V
Lead Channel Pacing Threshold Pulse Width: 0.4 ms
Lead Channel Pacing Threshold Pulse Width: 0.4 ms
Lead Channel Sensing Intrinsic Amplitude: 18.75 mV
Lead Channel Sensing Intrinsic Amplitude: 18.75 mV
Lead Channel Sensing Intrinsic Amplitude: 3.25 mV
Lead Channel Sensing Intrinsic Amplitude: 3.25 mV
Lead Channel Setting Pacing Amplitude: 2 V
Lead Channel Setting Pacing Amplitude: 2 V
Lead Channel Setting Pacing Amplitude: 2.5 V
Lead Channel Setting Pacing Pulse Width: 0.4 ms
Lead Channel Setting Pacing Pulse Width: 0.8 ms
Lead Channel Setting Sensing Sensitivity: 5.6 mV

## 2022-03-16 ENCOUNTER — Telehealth: Payer: Self-pay | Admitting: Internal Medicine

## 2022-03-16 NOTE — Telephone Encounter (Signed)
Called patient informed him that his pacemaker still had 2-3 months before elective replacement interval is reached and then at that point we will reach out to schedule an in clinic visit with Dr. Ladona Ridgel patient voiced understanding, patient voiced understanding of next remote check date to check pacemaker battery

## 2022-03-16 NOTE — Telephone Encounter (Signed)
Patient states he received a call to schedule an appointment with Dr. Ladona Ridgel, but he says he was told his pacemaker will be changed soon. He states he does not see the point in coming in if it's going to be changed.

## 2022-03-27 NOTE — Progress Notes (Signed)
Remote pacemaker transmission.   

## 2022-03-27 NOTE — Addendum Note (Signed)
Addended by: Cheri Kearns A on: 03/27/2022 09:43 AM   Modules accepted: Level of Service

## 2022-04-09 ENCOUNTER — Ambulatory Visit (INDEPENDENT_AMBULATORY_CARE_PROVIDER_SITE_OTHER): Payer: Medicare Other

## 2022-04-09 DIAGNOSIS — I442 Atrioventricular block, complete: Secondary | ICD-10-CM

## 2022-04-11 LAB — CUP PACEART REMOTE DEVICE CHECK
Battery Remaining Longevity: 3 mo
Battery Voltage: 2.8 V
Brady Statistic AP VP Percent: 12.94 %
Brady Statistic AP VS Percent: 0 %
Brady Statistic AS VP Percent: 87.03 %
Brady Statistic AS VS Percent: 0.03 %
Brady Statistic RA Percent Paced: 12.93 %
Brady Statistic RV Percent Paced: 99.9 %
Date Time Interrogation Session: 20230616202604
Implantable Lead Implant Date: 20130418
Implantable Lead Implant Date: 20130418
Implantable Lead Implant Date: 20150312
Implantable Lead Location: 753858
Implantable Lead Location: 753859
Implantable Lead Location: 753860
Implantable Pulse Generator Implant Date: 20150311
Lead Channel Impedance Value: 399 Ohm
Lead Channel Impedance Value: 4047 Ohm
Lead Channel Impedance Value: 4047 Ohm
Lead Channel Impedance Value: 4047 Ohm
Lead Channel Impedance Value: 456 Ohm
Lead Channel Impedance Value: 494 Ohm
Lead Channel Impedance Value: 513 Ohm
Lead Channel Impedance Value: 570 Ohm
Lead Channel Impedance Value: 627 Ohm
Lead Channel Pacing Threshold Amplitude: 0.5 V
Lead Channel Pacing Threshold Amplitude: 0.875 V
Lead Channel Pacing Threshold Pulse Width: 0.4 ms
Lead Channel Pacing Threshold Pulse Width: 0.4 ms
Lead Channel Sensing Intrinsic Amplitude: 18.75 mV
Lead Channel Sensing Intrinsic Amplitude: 18.75 mV
Lead Channel Sensing Intrinsic Amplitude: 3.375 mV
Lead Channel Sensing Intrinsic Amplitude: 3.375 mV
Lead Channel Setting Pacing Amplitude: 2 V
Lead Channel Setting Pacing Amplitude: 2 V
Lead Channel Setting Pacing Amplitude: 2.5 V
Lead Channel Setting Pacing Pulse Width: 0.4 ms
Lead Channel Setting Pacing Pulse Width: 0.8 ms
Lead Channel Setting Sensing Sensitivity: 5.6 mV

## 2022-04-13 ENCOUNTER — Other Ambulatory Visit: Payer: Self-pay | Admitting: Cardiovascular Disease

## 2022-05-11 ENCOUNTER — Ambulatory Visit (INDEPENDENT_AMBULATORY_CARE_PROVIDER_SITE_OTHER): Payer: Medicare Other

## 2022-05-11 DIAGNOSIS — I442 Atrioventricular block, complete: Secondary | ICD-10-CM

## 2022-05-12 DIAGNOSIS — I10 Essential (primary) hypertension: Secondary | ICD-10-CM | POA: Diagnosis not present

## 2022-05-12 DIAGNOSIS — Z950 Presence of cardiac pacemaker: Secondary | ICD-10-CM | POA: Diagnosis not present

## 2022-05-12 DIAGNOSIS — Z1211 Encounter for screening for malignant neoplasm of colon: Secondary | ICD-10-CM | POA: Diagnosis not present

## 2022-05-12 DIAGNOSIS — E1149 Type 2 diabetes mellitus with other diabetic neurological complication: Secondary | ICD-10-CM | POA: Diagnosis not present

## 2022-05-12 DIAGNOSIS — E1152 Type 2 diabetes mellitus with diabetic peripheral angiopathy with gangrene: Secondary | ICD-10-CM | POA: Diagnosis not present

## 2022-05-12 DIAGNOSIS — I739 Peripheral vascular disease, unspecified: Secondary | ICD-10-CM | POA: Diagnosis not present

## 2022-05-12 DIAGNOSIS — I442 Atrioventricular block, complete: Secondary | ICD-10-CM | POA: Diagnosis not present

## 2022-05-12 DIAGNOSIS — Z0000 Encounter for general adult medical examination without abnormal findings: Secondary | ICD-10-CM | POA: Diagnosis not present

## 2022-05-12 DIAGNOSIS — I2510 Atherosclerotic heart disease of native coronary artery without angina pectoris: Secondary | ICD-10-CM | POA: Diagnosis not present

## 2022-05-12 DIAGNOSIS — J449 Chronic obstructive pulmonary disease, unspecified: Secondary | ICD-10-CM | POA: Diagnosis not present

## 2022-05-12 DIAGNOSIS — E7800 Pure hypercholesterolemia, unspecified: Secondary | ICD-10-CM | POA: Diagnosis not present

## 2022-05-12 LAB — CUP PACEART REMOTE DEVICE CHECK
Battery Remaining Longevity: 3 mo
Battery Voltage: 2.79 V
Brady Statistic AP VP Percent: 10.51 %
Brady Statistic AP VS Percent: 0 %
Brady Statistic AS VP Percent: 89.46 %
Brady Statistic AS VS Percent: 0.03 %
Brady Statistic RA Percent Paced: 10.49 %
Brady Statistic RV Percent Paced: 99.9 %
Date Time Interrogation Session: 20230718165249
Implantable Lead Implant Date: 20130418
Implantable Lead Implant Date: 20130418
Implantable Lead Implant Date: 20150312
Implantable Lead Location: 753858
Implantable Lead Location: 753859
Implantable Lead Location: 753860
Implantable Pulse Generator Implant Date: 20150311
Lead Channel Impedance Value: 399 Ohm
Lead Channel Impedance Value: 4047 Ohm
Lead Channel Impedance Value: 4047 Ohm
Lead Channel Impedance Value: 4047 Ohm
Lead Channel Impedance Value: 456 Ohm
Lead Channel Impedance Value: 513 Ohm
Lead Channel Impedance Value: 513 Ohm
Lead Channel Impedance Value: 608 Ohm
Lead Channel Impedance Value: 665 Ohm
Lead Channel Pacing Threshold Amplitude: 0.5 V
Lead Channel Pacing Threshold Amplitude: 0.875 V
Lead Channel Pacing Threshold Pulse Width: 0.4 ms
Lead Channel Pacing Threshold Pulse Width: 0.4 ms
Lead Channel Sensing Intrinsic Amplitude: 18.75 mV
Lead Channel Sensing Intrinsic Amplitude: 18.75 mV
Lead Channel Sensing Intrinsic Amplitude: 2.75 mV
Lead Channel Sensing Intrinsic Amplitude: 2.75 mV
Lead Channel Setting Pacing Amplitude: 2 V
Lead Channel Setting Pacing Amplitude: 2 V
Lead Channel Setting Pacing Amplitude: 2.5 V
Lead Channel Setting Pacing Pulse Width: 0.4 ms
Lead Channel Setting Pacing Pulse Width: 0.8 ms
Lead Channel Setting Sensing Sensitivity: 5.6 mV

## 2022-06-11 ENCOUNTER — Ambulatory Visit (INDEPENDENT_AMBULATORY_CARE_PROVIDER_SITE_OTHER): Payer: Medicare Other

## 2022-06-11 DIAGNOSIS — I442 Atrioventricular block, complete: Secondary | ICD-10-CM | POA: Diagnosis not present

## 2022-06-11 LAB — CUP PACEART REMOTE DEVICE CHECK
Battery Remaining Longevity: 2 mo
Battery Voltage: 2.77 V
Brady Statistic AP VP Percent: 7.67 %
Brady Statistic AP VS Percent: 0 %
Brady Statistic AS VP Percent: 92.32 %
Brady Statistic AS VS Percent: 0.02 %
Brady Statistic RA Percent Paced: 7.66 %
Brady Statistic RV Percent Paced: 99.95 %
Date Time Interrogation Session: 20230816004611
Implantable Lead Implant Date: 20130418
Implantable Lead Implant Date: 20130418
Implantable Lead Implant Date: 20150312
Implantable Lead Location: 753858
Implantable Lead Location: 753859
Implantable Lead Location: 753860
Implantable Pulse Generator Implant Date: 20150311
Lead Channel Impedance Value: 380 Ohm
Lead Channel Impedance Value: 4047 Ohm
Lead Channel Impedance Value: 4047 Ohm
Lead Channel Impedance Value: 4047 Ohm
Lead Channel Impedance Value: 494 Ohm
Lead Channel Impedance Value: 513 Ohm
Lead Channel Impedance Value: 551 Ohm
Lead Channel Impedance Value: 570 Ohm
Lead Channel Impedance Value: 627 Ohm
Lead Channel Pacing Threshold Amplitude: 0.5 V
Lead Channel Pacing Threshold Amplitude: 0.875 V
Lead Channel Pacing Threshold Pulse Width: 0.4 ms
Lead Channel Pacing Threshold Pulse Width: 0.4 ms
Lead Channel Sensing Intrinsic Amplitude: 18.75 mV
Lead Channel Sensing Intrinsic Amplitude: 18.75 mV
Lead Channel Sensing Intrinsic Amplitude: 2.625 mV
Lead Channel Sensing Intrinsic Amplitude: 2.625 mV
Lead Channel Setting Pacing Amplitude: 2 V
Lead Channel Setting Pacing Amplitude: 2 V
Lead Channel Setting Pacing Amplitude: 2.5 V
Lead Channel Setting Pacing Pulse Width: 0.4 ms
Lead Channel Setting Pacing Pulse Width: 0.8 ms
Lead Channel Setting Sensing Sensitivity: 5.6 mV

## 2022-06-12 NOTE — Addendum Note (Signed)
Addended by: Geralyn Flash D on: 06/12/2022 12:55 PM   Modules accepted: Level of Service

## 2022-06-12 NOTE — Progress Notes (Signed)
Remote pacemaker transmission.   

## 2022-07-07 NOTE — Progress Notes (Signed)
Remote pacemaker transmission.   

## 2022-07-13 ENCOUNTER — Ambulatory Visit (INDEPENDENT_AMBULATORY_CARE_PROVIDER_SITE_OTHER): Payer: Medicare Other

## 2022-07-13 DIAGNOSIS — I442 Atrioventricular block, complete: Secondary | ICD-10-CM

## 2022-07-14 ENCOUNTER — Telehealth: Payer: Self-pay

## 2022-07-14 LAB — CUP PACEART REMOTE DEVICE CHECK
Battery Remaining Longevity: 2 mo
Battery Voltage: 2.76 V
Brady Statistic AP VP Percent: 4.9 %
Brady Statistic AP VS Percent: 0 %
Brady Statistic AS VP Percent: 95.07 %
Brady Statistic AS VS Percent: 0.03 %
Brady Statistic RA Percent Paced: 4.9 %
Brady Statistic RV Percent Paced: 99.94 %
Date Time Interrogation Session: 20230919160421
Implantable Lead Implant Date: 20130418
Implantable Lead Implant Date: 20130418
Implantable Lead Implant Date: 20150312
Implantable Lead Location: 753858
Implantable Lead Location: 753859
Implantable Lead Location: 753860
Implantable Pulse Generator Implant Date: 20150311
Lead Channel Impedance Value: 380 Ohm
Lead Channel Impedance Value: 4047 Ohm
Lead Channel Impedance Value: 4047 Ohm
Lead Channel Impedance Value: 4047 Ohm
Lead Channel Impedance Value: 437 Ohm
Lead Channel Impedance Value: 494 Ohm
Lead Channel Impedance Value: 513 Ohm
Lead Channel Impedance Value: 589 Ohm
Lead Channel Impedance Value: 627 Ohm
Lead Channel Pacing Threshold Amplitude: 0.5 V
Lead Channel Pacing Threshold Amplitude: 0.875 V
Lead Channel Pacing Threshold Pulse Width: 0.4 ms
Lead Channel Pacing Threshold Pulse Width: 0.4 ms
Lead Channel Sensing Intrinsic Amplitude: 2.625 mV
Lead Channel Sensing Intrinsic Amplitude: 2.625 mV
Lead Channel Sensing Intrinsic Amplitude: 23 mV
Lead Channel Sensing Intrinsic Amplitude: 23 mV
Lead Channel Setting Pacing Amplitude: 2 V
Lead Channel Setting Pacing Amplitude: 2 V
Lead Channel Setting Pacing Amplitude: 2.5 V
Lead Channel Setting Pacing Pulse Width: 0.4 ms
Lead Channel Setting Pacing Pulse Width: 0.8 ms
Lead Channel Setting Sensing Sensitivity: 5.6 mV

## 2022-07-14 NOTE — Telephone Encounter (Signed)
Scheduled remote reviewed.  Device has reached RRT - route to triage  Pt aware.  Scheduled to see Dr. Lovena Le on Thursday 07/16/2022 at 1:45 pm to discuss gen change.

## 2022-07-16 ENCOUNTER — Encounter: Payer: Self-pay | Admitting: Internal Medicine

## 2022-07-16 ENCOUNTER — Ambulatory Visit: Payer: Medicare Other | Attending: Internal Medicine | Admitting: Internal Medicine

## 2022-07-16 VITALS — BP 112/60 | HR 67 | Ht 70.0 in | Wt 163.0 lb

## 2022-07-16 DIAGNOSIS — I442 Atrioventricular block, complete: Secondary | ICD-10-CM | POA: Diagnosis not present

## 2022-07-16 DIAGNOSIS — I5022 Chronic systolic (congestive) heart failure: Secondary | ICD-10-CM

## 2022-07-16 DIAGNOSIS — Z950 Presence of cardiac pacemaker: Secondary | ICD-10-CM

## 2022-07-16 LAB — CBC WITH DIFFERENTIAL/PLATELET
Basophils Absolute: 0.1 10*3/uL (ref 0.0–0.2)
Basos: 1 %
EOS (ABSOLUTE): 0.2 10*3/uL (ref 0.0–0.4)
Eos: 2 %
Hematocrit: 39.9 % (ref 37.5–51.0)
Hemoglobin: 13.3 g/dL (ref 13.0–17.7)
Lymphocytes Absolute: 1.5 10*3/uL (ref 0.7–3.1)
Lymphs: 16 %
MCH: 32.5 pg (ref 26.6–33.0)
MCHC: 33.3 g/dL (ref 31.5–35.7)
MCV: 98 fL — ABNORMAL HIGH (ref 79–97)
Monocytes Absolute: 0.8 10*3/uL (ref 0.1–0.9)
Monocytes: 9 %
Neutrophils Absolute: 6.6 10*3/uL (ref 1.4–7.0)
Neutrophils: 72 %
Platelets: 309 10*3/uL (ref 150–450)
RBC: 4.09 x10E6/uL — ABNORMAL LOW (ref 4.14–5.80)
RDW: 13.4 % (ref 11.6–15.4)
WBC: 9.1 10*3/uL (ref 3.4–10.8)

## 2022-07-16 LAB — BASIC METABOLIC PANEL
BUN/Creatinine Ratio: 20 (ref 10–24)
BUN: 15 mg/dL (ref 8–27)
CO2: 29 mmol/L (ref 20–29)
Calcium: 9.5 mg/dL (ref 8.6–10.2)
Chloride: 100 mmol/L (ref 96–106)
Creatinine, Ser: 0.74 mg/dL — ABNORMAL LOW (ref 0.76–1.27)
Glucose: 230 mg/dL — ABNORMAL HIGH (ref 70–99)
Potassium: 4.5 mmol/L (ref 3.5–5.2)
Sodium: 138 mmol/L (ref 134–144)
eGFR: 94 mL/min/{1.73_m2} (ref >59)

## 2022-07-16 NOTE — H&P (View-Only) (Signed)
HPI Mr. Muck returns today for ongoing evaluation and management of CAD, chronic systolic heart failure, COPD, and atrial arrhythmias. In the interim, the patient has not been sob. He denies syncope. No edema. His last visit was over 3 years ago and he has reached ERI.  Allergies  Allergen Reactions   Pollen Extract Other (See Comments)    "runny nose"     Current Outpatient Medications  Medication Sig Dispense Refill   albuterol-ipratropium (COMBIVENT) 18-103 MCG/ACT inhaler Inhale 2 puffs into the lungs every 6 (six) hours as needed for wheezing or shortness of breath.     aspirin EC 81 MG tablet Take 81 mg by mouth daily.     benazepril (LOTENSIN) 5 MG tablet Take 5 mg by mouth daily.     clopidogrel (PLAVIX) 75 MG tablet TAKE 1 TABLET BY MOUTH  DAILY 90 tablet 2   furosemide (LASIX) 20 MG tablet TAKE 1 TABLET BY MOUTH  DAILY 90 tablet 3   glipiZIDE (GLUCOTROL XL) 5 MG 24 hr tablet Take 5 mg by mouth daily.     isosorbide mononitrate (IMDUR) 30 MG 24 hr tablet TAKE 1 TABLET BY MOUTH  DAILY 30 tablet 1   metFORMIN (GLUCOPHAGE) 1000 MG tablet Take 1 tablet (1,000 mg total) by mouth 2 (two) times daily with a meal. HOLD for 2 days. Restart 03/10/2014.     metoprolol tartrate (LOPRESSOR) 100 MG tablet TAKE 1 TABLET BY MOUTH TWO  TIMES DAILY 180 tablet 2   nitroGLYCERIN (NITROSTAT) 0.4 MG SL tablet Place 1 tablet (0.4 mg total) under the tongue every 5 (five) minutes as needed for chest pain. 25 tablet 6   pantoprazole (PROTONIX) 40 MG tablet Take 40 mg by mouth daily as needed (acid reflux).      pioglitazone (ACTOS) 15 MG tablet Take 15 mg by mouth daily.     rosuvastatin (CRESTOR) 10 MG tablet TAKE 1 TABLET BY MOUTH  DAILY 90 tablet 3   No current facility-administered medications for this visit.     Past Medical History:  Diagnosis Date   Atrial tachycardia ([place])    Cardiomyopathy ([place])    mixed ischemic and nonischemic   CHB (complete heart block) ([place]) 02/11/12    Medtronic permanent pacemaker   CHF (congestive heart failure) (H[facility])    COPD (chronic obstructive pulmonary disease) (H[facility])    Coronary artery disease    DOE (dyspnea on exertion)    NYHA class 2-3   DVT (deep venous thrombosis) ([place]) 12/2005   RLE "had to have OR"   Dyslipidemia    GERD (gastroesophageal reflux disease)    Hypertension    LBBB (left bundle branch block)    Myocardial infarction ([place])    "I've had 6" (01/03/2014)   Psoriasis    Sleep apnea    c pap at home, does not use   Type II diabetes mellitus ([place])     ROS:   All systems reviewed and negative except as noted in the HPI.   Past Surgical History:  Procedure Laterality Date   BI-VENTRICULAR PACEMAKER REVISION N/A 03/06/2014   Procedure: BI-VENTRICULAR PACEMAKER REVISION (CRT-R);  Surgeon: Evans Lance, MD;  Location: [facility] CATH LAB;  Service: Cardiovascular;  Laterality: N/A;   CARDIAC CATHETERIZATION  02/10/12   left CX: dominant w/40% prox. AV groove stenosis.The first & second marginal branches had stents visualized near the ostium of the vessel bu were functionally occluded. The remainder of the dominant CX had minor  irregularities.  RCA: nondominant free of sign. disease. SVG to OM1 & OM2 sequentially was widely patent.   CATARACT EXTRACTION Left    CORONARY ANGIOPLASTY WITH STENT PLACEMENT     "I had 9 stents before the OHS in 2007" (01/03/2014)   CORONARY ARTERY BYPASS GRAFT  2007   x 3   ELECTROPHYSIOLOGY STUDY  01/03/2014   EPS by Dr Lovena Le with no inducible ventricular arrhythmias   ELECTROPHYSIOLOGY STUDY N/A 01/03/2014   Procedure: ELECTROPHYSIOLOGY STUDY;  Surgeon: Evans Lance, MD;  Location: [facility] CATH LAB;  Service: Cardiovascular;  Laterality: N/A;   LEAD REVISION  03-06-2014   LV lead revision by Dr Lovena Le   LEFT HEART CATHETERIZATION WITH CORONARY/GRAFT ANGIOGRAM  02/10/2012   Procedure: LEFT HEART CATHETERIZATION WITH Beatrix Fetters;  Surgeon: Lorretta Harp, MD;  Location: [facility] CATH  LAB;  Service: Cardiovascular;;   NM MYOCAR PERF WALL MOTION  10/02/2009   no significant ischemia   PACEMAKER INSERTION  2013; 01/03/2014   MDT dual chamber pacemaker implanted 2013 for complete heart block; upgrade to CRTP (MDT) by Dr Lovena Le 12/2013 due to cardiomyopathy and heart failure   PERMANENT PACEMAKER INSERTION N/A 02/11/2012   Procedure: PERMANENT PACEMAKER INSERTION;  Surgeon: Sanda Klein, MD;  Location: [facility] CATH LAB;  Service: Cardiovascular;  Laterality: N/A;   REFRACTIVE SURGERY Right    TEMPORARY PACEMAKER INSERTION Bilateral 02/10/2012   Procedure: TEMPORARY PACEMAKER INSERTION;  Surgeon: Lorretta Harp, MD;  [facility] CATH LAB;  Service: Cardiovascular;  Laterality: Bilateral;   US ECHOCARDIOGRAPHY  [id number]   LV systolic fx mod to severely reduced, EF 35-40%, AOV mildly sclerotic     Family History  Problem Relation Age of Onset   Cancer Father      Social History   Socioeconomic History   Marital status: Married    Spouse name: Not on file   Number of children: Not on file   Years of education: Not on file   Highest education level: Not on file  Occupational History   Not on file  Tobacco Use   Smoking status: Former    Packs/day: 15.00    Years: 36.00    Total pack years: 540.00    Types: Cigars, Cigarettes    Quit date: 05/25/2005    Years since quitting: 17.1   Smokeless tobacco: Never   Tobacco comments:    15 cigars/daily when smoking  Substance and Sexual Activity   Alcohol use: Yes    Alcohol/week: 0.0 standard drinks of alcohol    Comment: "quit alcohol in 1985 or 1986"   Drug use: No   Sexual activity: Not Currently  Other Topics Concern   Not on file  Social History Narrative   Not on file   Social Determinants of Health   Financial Resource Strain: Not on file  Food Insecurity: Not on file  Transportation Needs: Not on file  Physical Activity: Not on file  Stress: Not on file  Social Connections: Not on file  Intimate  Partner Violence: Not on file     BP 112/60   Pulse 67   Ht 5\' 10"  (1.778 m)   Wt 163 lb (73.9 kg)   SpO2 95%   BMI 23.39 kg/m   Physical Exam:  Well appearing NAD HEENT: Unremarkable Neck:  No JVD, no thyromegally Lymphatics:  No adenopathy Back:  No CVA tenderness Lungs:  Clear HEART:  Regular rate rhythm, no murmurs, no rubs, no clicks Abd:  soft, positive bowel sounds,  no organomegally, no rebound, no guarding Ext:  2 plus pulses, no edema, no cyanosis, no clubbing Skin:  No rashes no nodules Neuro:  CN II through XII intact, motor grossly intact  EKG - nsr  with biv pacing  DEVICE  Normal device function.  See PaceArt for details. ERI  Assess/Plan:  1. Chronic systolic heart failure - he had a normalization of his LV function. No change in meds. 2. BIV PPM - his medtronic device has been reprogrammed after the LV ring has been found to not be working correctly. He is programmed tip to can with no diaphragmatic stim. He has reached ERI and will undergo PM gen change out. 3. CHB - he is asymptomatic,s /p PPM insertion.   [name].D.

## 2022-07-16 NOTE — Progress Notes (Signed)
    HPI Mr. Mcglown returns today for ongoing evaluation and management of CAD, chronic systolic heart failure, COPD, and atrial arrhythmias. In the interim, the patient has not been sob. He denies syncope. No edema. His last visit was over 3 years ago and he has reached ERI.  Allergies  Allergen Reactions   Pollen Extract Other (See Comments)    "runny nose"     Current Outpatient Medications  Medication Sig Dispense Refill   albuterol-ipratropium (COMBIVENT) 18-103 MCG/ACT inhaler Inhale 2 puffs into the lungs every 6 (six) hours as needed for wheezing or shortness of breath.     aspirin EC 81 MG tablet Take 81 mg by mouth daily.     benazepril (LOTENSIN) 5 MG tablet Take 5 mg by mouth daily.     clopidogrel (PLAVIX) 75 MG tablet TAKE 1 TABLET BY MOUTH  DAILY 90 tablet 2   furosemide (LASIX) 20 MG tablet TAKE 1 TABLET BY MOUTH  DAILY 90 tablet 3   glipiZIDE (GLUCOTROL XL) 5 MG 24 hr tablet Take 5 mg by mouth daily.     isosorbide mononitrate (IMDUR) 30 MG 24 hr tablet TAKE 1 TABLET BY MOUTH  DAILY 30 tablet 1   metFORMIN (GLUCOPHAGE) 1000 MG tablet Take 1 tablet (1,000 mg total) by mouth 2 (two) times daily with a meal. HOLD for 2 days. Restart 03/10/2014.     metoprolol tartrate (LOPRESSOR) 100 MG tablet TAKE 1 TABLET BY MOUTH TWO  TIMES DAILY 180 tablet 2   nitroGLYCERIN (NITROSTAT) 0.4 MG SL tablet Place 1 tablet (0.4 mg total) under the tongue every 5 (five) minutes as needed for chest pain. 25 tablet 6   pantoprazole (PROTONIX) 40 MG tablet Take 40 mg by mouth daily as needed (acid reflux).      pioglitazone (ACTOS) 15 MG tablet Take 15 mg by mouth daily.     rosuvastatin (CRESTOR) 10 MG tablet TAKE 1 TABLET BY MOUTH  DAILY 90 tablet 3   No current facility-administered medications for this visit.     Past Medical History:  Diagnosis Date   Atrial tachycardia (HCC)    Cardiomyopathy (HCC)    mixed ischemic and nonischemic   CHB (complete heart block) (HCC) 02/11/12    Medtronic permanent pacemaker   CHF (congestive heart failure) (HCC)    COPD (chronic obstructive pulmonary disease) (HCC)    Coronary artery disease    DOE (dyspnea on exertion)    NYHA class 2-3   DVT (deep venous thrombosis) (HCC) 12/2005   RLE "had to have OR"   Dyslipidemia    GERD (gastroesophageal reflux disease)    Hypertension    LBBB (left bundle branch block)    Myocardial infarction (HCC)    "I've had 6" (01/03/2014)   Psoriasis    Sleep apnea    c pap at home, does not use   Type II diabetes mellitus (HCC)     ROS:   All systems reviewed and negative except as noted in the HPI.   Past Surgical History:  Procedure Laterality Date   BI-VENTRICULAR PACEMAKER REVISION N/A 03/06/2014   Procedure: BI-VENTRICULAR PACEMAKER REVISION (CRT-R);  Surgeon: Johncarlo Maalouf W Taite Baldassari, MD;  Location: [facility] CATH LAB;  Service: Cardiovascular;  Laterality: N/A;   CARDIAC CATHETERIZATION  02/10/12   left CX: dominant w/40% prox. AV groove stenosis.The first & second marginal branches had stents visualized near the ostium of the vessel bu were functionally occluded. The remainder of the dominant CX had minor   irregularities.  RCA: nondominant free of sign. disease. SVG to OM1 & OM2 sequentially was widely patent.   CATARACT EXTRACTION Left    CORONARY ANGIOPLASTY WITH STENT PLACEMENT     "I had 9 stents before the OHS in 2007" (01/03/2014)   CORONARY ARTERY BYPASS GRAFT  2007   x 3   ELECTROPHYSIOLOGY STUDY  01/03/2014   EPS by Dr Bridgitte Felicetti with no inducible ventricular arrhythmias   ELECTROPHYSIOLOGY STUDY N/A 01/03/2014   Procedure: ELECTROPHYSIOLOGY STUDY;  Surgeon: Connar Keating W Mitchelle Sultan, MD;  Location: [facility] CATH LAB;  Service: Cardiovascular;  Laterality: N/A;   LEAD REVISION  03-06-2014   LV lead revision by Dr Burnette Valenti   LEFT HEART CATHETERIZATION WITH CORONARY/GRAFT ANGIOGRAM  02/10/2012   Procedure: LEFT HEART CATHETERIZATION WITH CORONARY/GRAFT ANGIOGRAM;  Surgeon: Jonathan J Berry, MD;  Location: [facility] CATH  LAB;  Service: Cardiovascular;;   NM MYOCAR PERF WALL MOTION  10/02/2009   no significant ischemia   PACEMAKER INSERTION  2013; 01/03/2014   MDT dual chamber pacemaker implanted 2013 for complete heart block; upgrade to CRTP (MDT) by Dr Shandrika Ambers 12/2013 due to cardiomyopathy and heart failure   PERMANENT PACEMAKER INSERTION N/A 02/11/2012   Procedure: PERMANENT PACEMAKER INSERTION;  Surgeon: Mihai Croitoru, MD;  Location: [facility] CATH LAB;  Service: Cardiovascular;  Laterality: N/A;   REFRACTIVE SURGERY Right    TEMPORARY PACEMAKER INSERTION Bilateral 02/10/2012   Procedure: TEMPORARY PACEMAKER INSERTION;  Surgeon: Jonathan J Berry, MD;  Location: [facility] CATH LAB;  Service: Cardiovascular;  Laterality: Bilateral;   US ECHOCARDIOGRAPHY  01/10/10   LV systolic fx mod to severely reduced, EF 35-40%, AOV mildly sclerotic     Family History  Problem Relation Age of Onset   Cancer Father      Social History   Socioeconomic History   Marital status: Married    Spouse name: Not on file   Number of children: Not on file   Years of education: Not on file   Highest education level: Not on file  Occupational History   Not on file  Tobacco Use   Smoking status: Former    Packs/day: 15.00    Years: 36.00    Total pack years: 540.00    Types: Cigars, Cigarettes    Quit date: 05/25/2005    Years since quitting: 17.1   Smokeless tobacco: Never   Tobacco comments:    15 cigars/daily when smoking  Substance and Sexual Activity   Alcohol use: Yes    Alcohol/week: 0.0 standard drinks of alcohol    Comment: "quit alcohol in 1985 or 1986"   Drug use: No   Sexual activity: Not Currently  Other Topics Concern   Not on file  Social History Narrative   Not on file   Social Determinants of Health   Financial Resource Strain: Not on file  Food Insecurity: Not on file  Transportation Needs: Not on file  Physical Activity: Not on file  Stress: Not on file  Social Connections: Not on file  Intimate  Partner Violence: Not on file     BP 112/60   Pulse 67   Ht 5' 10" (1.778 m)   Wt 163 lb (73.9 kg)   SpO2 95%   BMI 23.39 kg/m   Physical Exam:  Well appearing NAD HEENT: Unremarkable Neck:  No JVD, no thyromegally Lymphatics:  No adenopathy Back:  No CVA tenderness Lungs:  Clear HEART:  Regular rate rhythm, no murmurs, no rubs, no clicks Abd:  soft, positive bowel sounds,   no organomegally, no rebound, no guarding Ext:  2 plus pulses, no edema, no cyanosis, no clubbing Skin:  No rashes no nodules Neuro:  CN II through XII intact, motor grossly intact  EKG - nsr  with biv pacing  DEVICE  Normal device function.  See PaceArt for details. ERI  Assess/Plan:  1. Chronic systolic heart failure - he had a normalization of his LV function. No change in meds. 2. BIV PPM - his medtronic device has been reprogrammed after the LV ring has been found to not be working correctly. He is programmed tip to can with no diaphragmatic stim. He has reached ERI and will undergo PM gen change out. 3. CHB - he is asymptomatic,s /p PPM insertion.   [name].D.

## 2022-07-16 NOTE — Patient Instructions (Addendum)
Medication Instructions:  Your physician recommends that you continue on your current medications as directed. Please refer to the Current Medication list given to you today.  *If you need a refill on your cardiac medications before your next appointment, please call your pharmacy*  Lab Work: You will have lab work drawn today: CBC and BMET   You will be having a Bi V Pacemaker Generator Change with Dr. [name].  Testing/Procedures: None ordered.  Follow-Up: At [facility], you and your health needs are our priority.  As part of our continuing mission to provide you with exceptional heart care, we have created designated Provider Care Teams.  These Care Teams include your primary Cardiologist (physician) and Advanced Practice Providers (APPs -  Physician Assistants and Nurse Practitioners) who all work together to provide you with the care you need, when you need it.  We recommend signing up for the patient portal called "MyChart".  Sign up information is provided on this After Visit Summary.  MyChart is used to connect with patients for Virtual Visits (Telemedicine).  Patients are able to view lab/test results, encounter notes, upcoming appointments, etc.  Non-urgent messages can be sent to your provider as well.   To learn more about what you can do with MyChart, go to [email or web address].    Your next appointment:   See instruction letter for Bi V Pacemaker Generator Change information.    The format for your next appointment:   In Person  Provider:   [name], MD{or one of the following Advanced Practice Providers on your designated Care Team:   Tommye Standard, [place] Legrand Como "Jonni Sanger" Chalmers Cater, [place]  Remote monitoring is used to monitor your Pacemaker from home. This monitoring reduces the number of office visits required to check your device to one time per year. It allows [place] to keep an eye on the functioning of your device to ensure it is working properly. You are  scheduled for a device check from home on 08/13/22. You may send your transmission at any time that day. If you have a wireless device, the transmission will be sent automatically. After your physician reviews your transmission, you will receive a postcard with your next transmission date.  Pacemaker Battery Change  A pacemaker battery usually lasts 5-15 years (6-7 years on average). A few times a year, you may be asked to visit your health care provider to have a full evaluation of your pacemaker. When the battery is low, your pacemaker will be completely replaced. Most often, this procedure is simpler than the first surgery because the wires (leads) that connect the pacemaker to the heart are already in place. There are many things that affect how long a pacemaker battery will last, including: The age of the pacemaker. The number of leads you have(1, 2, or 3). The use or workload of the pacemaker. If the pacemaker is helping the heart more often, the battery will not last as long. Power (voltage) settings. Tell a health care provider about: Any allergies you have. All medicines you are taking, including vitamins, herbs, eye drops, creams, and over-the-counter medicines. Any problems you or family members have had with anesthetic medicines. Any blood disorders you have. Any surgeries you have had, especially any surgeries you have had since your last pacemaker was placed. Any medical conditions you have. Whether you are pregnant or may be pregnant. What are the risks? Generally, this is a safe procedure. However, problems may occur, including: Bleeding. Infection. Nerve damage. Allergic reaction to medicines.  Damage to the leads that go to the heart. What happens before the procedure? Staying hydrated Follow instructions from your health care provider about hydration, which may include: Up to 2 hours before the procedure - you may continue to drink clear liquids, such as water, clear  fruit juice, black coffee, and plain tea. Eating and drinking restrictions Follow instructions from your health care provider about eating and drinking restrictions, which may include: 8 hours before the procedure - stop eating heavy meals or foods, such as meat, fried foods, or fatty foods. 6 hours before the procedure - stop eating light meals or foods, such as toast or cereal. 6 hours before the procedure - stop drinking milk or drinks that contain milk. 2 hours before the procedure - stop drinking clear liquids. Medicines Ask your health care provider about: Changing or stopping your regular medicines. This is especially important if you are taking diabetes medicines or blood thinners. Taking medicines such as aspirin and ibuprofen. These medicines can thin your blood. Do not take these medicines unless your health care provider tells you to take them. Taking over-the-counter medicines, vitamins, herbs, and supplements. General instructions Ask your health care provider what steps will be taken to help prevent infection. These may include: Removing hair at the surgery site. Washing skin with a germ-killing soap. Receiving antibiotic medicine. Plan to have someone take you home from the hospital or clinic. If you will be going home right after the procedure, plan to have someone with you for 24 hours. What happens during the procedure? An IV will be inserted into one of your veins. You will be given one or more of the following: A medicine to help you relax (sedative). A medicine to numb the area where the pacemaker is located (local anesthetic). Your health care provider will make an incision to reopen the pocket holding the pacemaker. The old pacemaker will be disconnected from the leads. The leads will be tested. If needed, the leads will be replaced. If the leads are functioning properly, the new pacemaker will be connected to the existing leads. A heart monitor and a pacemaker  programmer will be used to make sure that the newly implanted pacemaker is working properly. The incision site will be closed with stitches (sutures), adhesive strips, or skin glue. A bandage (dressing) will be placed over the pacemaker site. The procedure may vary among health care providers and hospitals. What happens after the procedure? Your blood pressure, heart rate, breathing rate, and blood oxygen level will be monitored until you leave the hospital or clinic. You may be given antibiotics. Your health care provider will tell you when your pacemaker will need to be tested again, or when to return to the office for removal of the dressing and sutures. If you were given a sedative during the procedure, it can affect you for several hours. Do not drive or operate machinery until your health care provider says that it is safe. You will be given a pacemaker identification card. This card lists the implant date, device model, and manufacturer of your pacemaker. Summary A pacemaker battery usually lasts 5-15 years (6-7 years on average). When the battery is low, your pacemaker will need to be replaced. Most often, this procedure is simpler than the first surgery because the wires (leads) that connect the pacemaker to the heart are already in place. Risks of this procedure include bleeding, infection, and allergic reactions to medicines. This information is not intended to replace advice given to you  by your health care provider. Make sure you discuss any questions you have with your health care provider. Document Revised: 09/14/2019 Document Reviewed: 09/14/2019 Elsevier Patient Education  2023 [employer].

## 2022-07-24 NOTE — Pre-Procedure Instructions (Signed)
Attempted to call patient.  No answer unable to leave message voicemail not set up.

## 2022-07-24 NOTE — Pre-Procedure Instructions (Signed)
Informed patient of time change. Instructed patient on the following items: Arrival time 0730 Nothing to eat or drink after midnight No meds AM of procedure Responsible person to drive you home and stay with you for 24 hrs Wash with special soap night before and morning of procedure

## 2022-07-25 NOTE — Progress Notes (Signed)
Remote pacemaker transmission.   

## 2022-07-25 NOTE — Addendum Note (Signed)
Addended by: Douglass Rivers D on: 07/25/2022 09:38 PM   Modules accepted: Level of Service

## 2022-07-27 ENCOUNTER — Encounter (HOSPITAL_COMMUNITY): Payer: Self-pay | Admitting: Internal Medicine

## 2022-07-27 ENCOUNTER — Ambulatory Visit (HOSPITAL_COMMUNITY)
Admission: RE | Admit: 2022-07-27 | Discharge: 2022-07-27 | Disposition: A | Discharge status: Home / Self Care | Payer: Medicare Other | Attending: Internal Medicine | Admitting: Internal Medicine

## 2022-07-27 ENCOUNTER — Encounter (HOSPITAL_COMMUNITY)
Admission: RE | Disposition: A | Discharge status: Home / Self Care | Payer: Self-pay | Source: Home / Self Care | Attending: Internal Medicine

## 2022-07-27 ENCOUNTER — Other Ambulatory Visit: Payer: Self-pay

## 2022-07-27 DIAGNOSIS — Z87891 Personal history of nicotine dependence: Secondary | ICD-10-CM | POA: Insufficient documentation

## 2022-07-27 DIAGNOSIS — Z45010 Encounter for checking and testing of cardiac pacemaker pulse generator [battery]: Secondary | ICD-10-CM | POA: Diagnosis not present

## 2022-07-27 DIAGNOSIS — I5022 Chronic systolic (congestive) heart failure: Secondary | ICD-10-CM | POA: Insufficient documentation

## 2022-07-27 DIAGNOSIS — I110 Hypertensive heart disease with heart failure: Secondary | ICD-10-CM | POA: Insufficient documentation

## 2022-07-27 DIAGNOSIS — J449 Chronic obstructive pulmonary disease, unspecified: Secondary | ICD-10-CM | POA: Insufficient documentation

## 2022-07-27 DIAGNOSIS — I442 Atrioventricular block, complete: Secondary | ICD-10-CM | POA: Insufficient documentation

## 2022-07-27 DIAGNOSIS — I2510 Atherosclerotic heart disease of native coronary artery without angina pectoris: Secondary | ICD-10-CM | POA: Diagnosis not present

## 2022-07-27 HISTORY — PX: BIV PACEMAKER GENERATOR CHANGEOUT: EP1198

## 2022-07-27 LAB — GLUCOSE, CAPILLARY: Glucose-Capillary: 103 mg/dL — ABNORMAL HIGH (ref 70–99)

## 2022-07-27 SURGERY — BIV PACEMAKER GENERATOR CHANGEOUT

## 2022-07-27 MED ORDER — MIDAZOLAM HCL 5 MG/5ML IJ SOLN
INTRAMUSCULAR | Status: AC
  Filled 2022-07-27: qty 5

## 2022-07-27 MED ORDER — SODIUM CHLORIDE 0.9 % IV SOLN
INTRAVENOUS | Status: AC
  Filled 2022-07-27: qty 2

## 2022-07-27 MED ORDER — SODIUM CHLORIDE 0.9 % IV SOLN
80.0000 mg | INTRAVENOUS | Status: AC
  Administered 2022-07-27: 80 mg

## 2022-07-27 MED ORDER — LIDOCAINE HCL (PF) 1 % IJ SOLN
INTRAMUSCULAR | Status: DC | PRN
  Administered 2022-07-27: 55 mL

## 2022-07-27 MED ORDER — MIDAZOLAM HCL 5 MG/5ML IJ SOLN
INTRAMUSCULAR | Status: DC | PRN
  Administered 2022-07-27: 2 mg via INTRAVENOUS

## 2022-07-27 MED ORDER — SODIUM CHLORIDE 0.9 % IV SOLN: INTRAVENOUS | Status: DC

## 2022-07-27 MED ORDER — FENTANYL CITRATE (PF) 100 MCG/2ML IJ SOLN
INTRAMUSCULAR | Status: DC | PRN
  Administered 2022-07-27: 25 ug via INTRAVENOUS

## 2022-07-27 MED ORDER — ACETAMINOPHEN 325 MG PO TABS: 325.0000 mg | ORAL_TABLET | ORAL | Status: DC | PRN

## 2022-07-27 MED ORDER — CEFAZOLIN SODIUM-DEXTROSE 2-4 GM/100ML-% IV SOLN
2.0000 g | INTRAVENOUS | Status: AC
  Administered 2022-07-27: 2 g via INTRAVENOUS

## 2022-07-27 MED ORDER — POVIDONE-IODINE 10 % EX SWAB
2.0000 | Freq: Once | CUTANEOUS | Status: DC
Start: 2022-07-27 — End: 2022-07-27

## 2022-07-27 MED ORDER — CHLORHEXIDINE GLUCONATE 4 % EX LIQD
4.0000 | Freq: Once | CUTANEOUS | Status: DC
  Filled 2022-07-27: qty 60

## 2022-07-27 MED ORDER — ONDANSETRON HCL 4 MG/2ML IJ SOLN: 4.0000 mg | Freq: Four times a day (QID) | INTRAMUSCULAR | Status: DC | PRN

## 2022-07-27 MED ORDER — FENTANYL CITRATE (PF) 100 MCG/2ML IJ SOLN
INTRAMUSCULAR | Status: AC
  Filled 2022-07-27: qty 2

## 2022-07-27 MED ORDER — CEFAZOLIN SODIUM-DEXTROSE 2-4 GM/100ML-% IV SOLN
INTRAVENOUS | Status: AC
  Filled 2022-07-27: qty 100

## 2022-07-27 SURGICAL SUPPLY — 7 items
CABLE SURGICAL S-101-97-12 (CABLE) ×1 IMPLANT
PACEMAKER PRCT MRI CRTP W1TR01 (Pacemaker) IMPLANT
PAD DEFIB RADIO PHYSIO CONN (PAD) ×1 IMPLANT
POUCH AIGIS-R ANTIBACT PPM (Mesh General) ×1 IMPLANT
POUCH AIGIS-R ANTIBACT PPM MED (Mesh General) IMPLANT
PPM PRECEPTA MRI CRT-P W1TR01 (Pacemaker) ×1 IMPLANT
TRAY PACEMAKER INSERTION (PACKS) ×1 IMPLANT

## 2022-07-27 NOTE — Interval H&P Note (Signed)
History and Physical Interval Note:  07/27/2022 8:47 AM  [name]  has presented today for surgery, with the diagnosis of ERI.  The various methods of treatment have been discussed with the patient and family. After consideration of risks, benefits and other options for treatment, the patient has consented to  Procedure(s): [name] (N/A) as a surgical intervention.  The patient's history has been reviewed, patient examined, no change in status, stable for surgery.  I have reviewed the patient's chart and labs.  Questions were answered to the patient's satisfaction.     [name]

## 2022-07-27 NOTE — Discharge Instructions (Signed)
Pacemaker Device Battery Change, Care After  This sheet gives you information about how to care for yourself after your procedure. Your health care provider may also give you more specific instructions. If you have problems or questions, contact your health care provider. What can I expect after the procedure? After your procedure, it is common to have: Pain or soreness at the site where the cardiac device was inserted. Swelling at the site where the cardiac device was inserted. You should received an information card for your new device in 4-8 weeks. Follow these instructions at home: Incision care  Keep the incision clean and dry. Do not take baths, swim, or use a hot tub until after your wound check.  Do not shower for at least 7 days, or as directed by your health care provider. Pat the area dry with a clean towel. Do not rub the area. This may cause bleeding. Follow instructions from your health care provider about how to take care of your incision. Make sure you: Leave stitches (sutures), skin glue, or adhesive strips in place. These skin closures may need to stay in place for 2 weeks or longer. If adhesive strip edges start to loosen and curl up, you may trim the loose edges. Do not remove adhesive strips completely unless your health care provider tells you to do that. Check your incision area every day for signs of infection. Check for: More redness, swelling, or pain. More fluid or blood. Warmth. Pus or a bad smell. Activity Do not lift anything that is heavier than 10 lb (4.5 kg) until your health care provider says it is okay to do so. For the first week, or as long as told by your health care provider: Avoid lifting your affected arm higher than your shoulder. After 1 week, Be gentle when you move your arms over your head. It is okay to raise your arm to comb your hair. Avoid strenuous exercise. Ask your health care provider when it is okay to: Resume your normal  activities. Return to work or school. Resume sexual activity. Eating and drinking Eat a heart-healthy diet. This should include plenty of fresh fruits and vegetables, whole grains, low-fat dairy products, and lean protein like chicken and fish. Limit alcohol intake to no more than 1 drink a day for non-pregnant women and 2 drinks a day for men. One drink equals 12 oz of beer, 5 oz of wine, or 1 oz of hard liquor. Check ingredients and nutrition facts on packaged foods and beverages. Avoid the following types of food: Food that is high in salt (sodium). Food that is high in saturated fat, like full-fat dairy or red meat. Food that is high in trans fat, like fried food. Food and drinks that are high in sugar. Lifestyle Do not use any products that contain nicotine or tobacco, such as cigarettes and e-cigarettes. If you need help quitting, ask your health care provider. Take steps to manage and control your weight. Once cleared, get regular exercise. Aim for 150 minutes of moderate-intensity exercise (such as walking or yoga) or 75 minutes of vigorous exercise (such as running or swimming) each week. Manage other health problems, such as diabetes or high blood pressure. Ask your health care provider how you can manage these conditions. General instructions Do not drive for 24 hours after your procedure if you were given a medicine to help you relax (sedative). Take over-the-counter and prescription medicines only as told by your health care provider. Avoid putting pressure on  the area where the cardiac device was placed. If you need an MRI after your cardiac device has been placed, be sure to tell the health care provider who orders the MRI that you have a cardiac device. Avoid close and prolonged exposure to electrical devices that have strong magnetic fields. These include: Cell phones. Avoid keeping them in a pocket near the cardiac device, and try using the ear opposite the cardiac  device. MP3 players. Household appliances, like microwaves. Metal detectors. Electric generators. High-tension wires. Keep all follow-up visits as directed by your health care provider. This is important. Contact a health care provider if: You have pain at the incision site that is not relieved by over-the-counter or prescription medicines. You have any of these around your incision site or coming from it: More redness, swelling, or pain. Fluid or blood. Warmth to the touch. Pus or a bad smell. You have a fever. You feel brief, occasional palpitations, light-headedness, or any symptoms that you think might be related to your heart. Get help right away if: You experience chest pain that is different from the pain at the cardiac device site. You develop a red streak that extends above or below the incision site. You experience shortness of breath. You have palpitations or an irregular heartbeat. You have light-headedness that does not go away quickly. You faint or have dizzy spells. Your pulse suddenly drops or increases rapidly and does not return to normal. You begin to gain weight and your legs and ankles swell. Summary After your procedure, it is common to have pain, soreness, and some swelling where the cardiac device was inserted. Make sure to keep your incision clean and dry. Follow instructions from your health care provider about how to take care of your incision. Check your incision every day for signs of infection, such as more pain or swelling, pus or a bad smell, warmth, or leaking fluid and blood. Avoid strenuous exercise and lifting your left arm higher than your shoulder for 2 weeks, or as long as told by your health care provider. This information is not intended to replace advice given to you by your health care provider. Make sure you discuss any questions you have with your health care provider.

## 2022-07-28 ENCOUNTER — Telehealth: Payer: Self-pay | Admitting: Cardiovascular Disease

## 2022-07-28 NOTE — Telephone Encounter (Signed)
Spoke to patient he stated he had a PM generator change yesterday by Dr.Taylor.Stated he noticed this morning he has old dark blood on bandage.No pain.No swelling.He wants to know if he needs to change bandage.Advised I will send message to Tommye Standard PA for advice.He has appointment with Renee 10/16.

## 2022-07-28 NOTE — Telephone Encounter (Signed)
Outreach made to Pt.  Advised to remove outer dressing.   After he removed outer dressing he said wound site looks good (steristrips intact).  He will continue to monitor and call device clinic directly with any further concerns.

## 2022-07-28 NOTE — Telephone Encounter (Signed)
Calling bout his bandage seeing if he suppose to change it and also there is a little blood coming from it. Please advise

## 2022-07-29 ENCOUNTER — Encounter (HOSPITAL_COMMUNITY): Payer: Self-pay | Admitting: Internal Medicine

## 2022-08-09 NOTE — Progress Notes (Unsigned)
Cardiology Office Note Date:  08/09/2022  Patient ID:  Jakarius, Flamenco [birth year], MRN [id number] PCP:  Seward Carol, MD  Cardiologist:  Dr. Sallyanne Kuster Electrophysiologist: Dr. Lovena Le  ***refresh   Chief Complaint: *** wound check  History of Present Illness: JAILAN TRIMM is a 76 y.o. male with history of COPD (described as advanced), DM, , chronic CHF (combined), ICM, CAD (CABG 2007, ***), LBBB, ATach, PVD, small AAA  --45-50% prior to pacemaker implantation.  Following implantation of his pacemaker his echocardiogram showed moderately depressed left ventricular systolic function with an ejection fraction of 35-40%.  -- He improved following biventricular pacemaker upgrade, but then had lead dislodgment and deteriorated again.  --After repositioning of his left ventricular lead, left ventricular ejection fraction increased to 50-55 %- by his most recent echocardiogram in December 2015.     He saw Dr. Loletha Grayer Dec 2021 last, doing OK, > 99.9% BP, good HR excursion, , nl clear symptoms of HF, he was device dependent.  BP unable to tolerate ACE, requiring high dose BB for his Atach.  Never has been found with Afib.  He saw Dr. Lovena Le 07/16/22, his device had reached ERI status, no particular symptoms reported, symptoms.  He discussed: ** device has been reprogrammed after the LV ring has been found to not be working correctly. He is programmed tip to can with no diaphragmatic stim, has reached ERI and will undergo PM gen change out  Underwent gen change on 07/27/22  *** site *** symptoms *** volume *** dependent *** %BP *** overdue to see DrC   Device information MDT dual chamber PPM implanted 02/11/2012 > upgrade to CRT-P added LV lead 01/03/2014 > LV lead dislodged > LV lead removed, new lead implanted 03/06/14 > gen change 07/27/22 LV lead is programmed tip>can (known LV lead ring failure)   Past Medical History:  Diagnosis Date   Atrial tachycardia    Cardiomyopathy ([place])     mixed ischemic and nonischemic   CHB (complete heart block) ([place]) 02/11/12   Medtronic permanent pacemaker   CHF (congestive heart failure) ([facility])    COPD (chronic obstructive pulmonary disease) ([facility])    Coronary artery disease    DOE (dyspnea on exertion)    NYHA class 2-3   DVT (deep venous thrombosis) ([place]) 12/2005   RLE "had to have OR"   Dyslipidemia    GERD (gastroesophageal reflux disease)    Hypertension    LBBB (left bundle branch block)    Myocardial infarction ([place])    "I've had 6" (01/03/2014)   Psoriasis    Sleep apnea    c pap at home, does not use   Type II diabetes mellitus ([place])     Past Surgical History:  Procedure Laterality Date   BI-VENTRICULAR PACEMAKER REVISION N/A 03/06/2014   Procedure: BI-VENTRICULAR PACEMAKER REVISION (CRT-R);  Surgeon: Evans Lance, MD;  Location: [facility] CATH LAB;  Service: Cardiovascular;  Laterality: N/A;   BIV PACEMAKER GENERATOR CHANGEOUT N/A 07/27/2022   Procedure: BIV PACEMAKER GENERATOR CHANGEOUT;  Surgeon: Evans Lance, MD;  Location: [place] [facility] CV LAB;  Service: Cardiovascular;  Laterality: N/A;   CARDIAC CATHETERIZATION  02/10/12   left CX: dominant w/40% prox. AV groove stenosis.The first & second marginal branches had stents visualized near the ostium of the vessel bu were functionally occluded. The remainder of the dominant CX had minor irregularities.  RCA: nondominant free of sign. disease. SVG to OM1 & OM2 sequentially was widely patent.  CATARACT EXTRACTION Left    CORONARY ANGIOPLASTY WITH STENT PLACEMENT     "I had 9 stents before the [facility] in 2007" (01/03/2014)   CORONARY ARTERY BYPASS GRAFT  2007   x 3   ELECTROPHYSIOLOGY STUDY  01/03/2014   EPS by Dr Ladona Ridgel with no inducible ventricular arrhythmias   ELECTROPHYSIOLOGY STUDY N/A 01/03/2014   Procedure: ELECTROPHYSIOLOGY STUDY;  Surgeon: Marinus Maw, MD;  Location: [facility] CATH LAB;  Service: Cardiovascular;  Laterality: N/A;   LEAD REVISION  03-06-2014   LV lead revision by  Dr Ladona Ridgel   LEFT HEART CATHETERIZATION WITH CORONARY/GRAFT ANGIOGRAM  02/10/2012   Procedure: LEFT HEART CATHETERIZATION WITH Isabel Caprice;  Surgeon: Runell Gess, MD;  Location: [facility] CATH LAB;  Service: Cardiovascular;;   NM MYOCAR PERF WALL MOTION  10/02/2009   no significant ischemia   PACEMAKER INSERTION  2013; 01/03/2014   MDT dual chamber pacemaker implanted 2013 for complete heart block; upgrade to CRTP (MDT) by Dr Ladona Ridgel 12/2013 due to cardiomyopathy and heart failure   PERMANENT PACEMAKER INSERTION N/A 02/11/2012   Procedure: PERMANENT PACEMAKER INSERTION;  Surgeon: Thurmon Fair, MD;  Location: [facility] CATH LAB;  Service: Cardiovascular;  Laterality: N/A;   REFRACTIVE SURGERY Right    TEMPORARY PACEMAKER INSERTION Bilateral 02/10/2012   Procedure: TEMPORARY PACEMAKER INSERTION;  Surgeon: Runell Gess, MD;  Location: [facility] CATH LAB;  Service: Cardiovascular;  Laterality: Bilateral;   US ECHOCARDIOGRAPHY  01/10/10   LV systolic fx mod to severely reduced, EF 35-40%, AOV mildly sclerotic    Current Outpatient Medications  Medication Sig Dispense Refill   aspirin EC 81 MG tablet Take 81 mg by mouth daily.     benazepril (LOTENSIN) 5 MG tablet Take 5 mg by mouth daily.     clopidogrel (PLAVIX) 75 MG tablet TAKE 1 TABLET BY MOUTH  DAILY 90 tablet 2   furosemide (LASIX) 20 MG tablet TAKE 1 TABLET BY MOUTH  DAILY 90 tablet 3   glipiZIDE (GLUCOTROL XL) 5 MG 24 hr tablet Take 5 mg by mouth in the morning and at bedtime.     isosorbide mononitrate (IMDUR) 30 MG 24 hr tablet TAKE 1 TABLET BY MOUTH  DAILY 30 tablet 1   metFORMIN (GLUCOPHAGE) 1000 MG tablet Take 1 tablet (1,000 mg total) by mouth 2 (two) times daily with a meal. HOLD for 2 days. Restart 03/10/2014.     metoprolol tartrate (LOPRESSOR) 100 MG tablet TAKE 1 TABLET BY MOUTH TWO  TIMES DAILY 180 tablet 2   nitroGLYCERIN (NITROSTAT) 0.4 MG SL tablet Place 1 tablet (0.4 mg total) under the tongue every 5 (five) minutes as needed  for chest pain. 25 tablet 6   pantoprazole (PROTONIX) 40 MG tablet Take 40 mg by mouth daily as needed (acid reflux).      pioglitazone (ACTOS) 15 MG tablet Take 15 mg by mouth daily.     rosuvastatin (CRESTOR) 10 MG tablet TAKE 1 TABLET BY MOUTH  DAILY 90 tablet 3   No current facility-administered medications for this visit.    Allergies:   Pollen extract   Social History:  The patient  reports that he quit smoking about 17 years ago. His smoking use included cigars and cigarettes. He has a 540.00 pack-year smoking history. He has never used smokeless tobacco. He reports current alcohol use. He reports that he does not use drugs.   Family History:  The patient's family history includes Cancer in his father.  ROS:  Please see the history  of present illness.    All other systems are reviewed and otherwise negative.   PHYSICAL EXAM:  VS:  There were no vitals taken for this visit. BMI: There is no height or weight on file to calculate BMI. Well nourished, well developed, in no acute distress HEENT: normocephalic, atraumatic Neck: no JVD, carotid bruits or masses Cardiac:  *** RRR; no significant murmurs, no rubs, or gallops Lungs:  *** CTA b/l, no wheezing, rhonchi or rales Abd: soft, nontender MS: no deformity or *** atrophy Ext: *** no edema Skin: warm and dry, no rash Neuro:  No gross deficits appreciated Psych: euthymic mood, full affect  *** PPM site is stable, no tethering or discomfort   EKG:  not done today  Device interrogation done today and reviewed by myself:  ***   10/04/2014; TTE Study Conclusions - Left ventricle: The cavity size was normal. Wall thickness was    increased in a pattern of mild LVH. Systolic function was normal.    The estimated ejection fraction was in the range of 50% to 55%.    There is hypokinesis of the basalinferolateral myocardium. There    is hypokinesis of the apical myocardium. Doppler parameters are    consistent with abnormal  left ventricular relaxation (grade 1    diastolic dysfunction).  - Aortic root: The aortic root was mildly dilated.  - Mitral valve: Calcified annulus.   Impressions: - Hypokinesis of the apex and basal inferolateral wall with overall    low normal LV function; grade 1 diastolic dysfunction; mildly    dilated aortic root; compared to 11/30/13, LV function has    improved.   Recent Labs: 07/16/2022: BUN 15; Creatinine, Ser 0.74; Hemoglobin 13.3; Platelets 309; Potassium 4.5; Sodium 138  No results found for requested labs within last 365 days.   CrCl cannot be calculated (Patient's most recent lab result is older than the maximum 21 days allowed.).   Wt Readings from Last 3 Encounters:  07/27/22 161 lb (73 kg)  07/16/22 163 lb (73.9 kg)  10/02/20 163 lb (73.9 kg)     Other studies reviewed: Additional studies/records reviewed today include: summarized above  ASSESSMENT AND PLAN:  CRT-P ***  ICM Chronic CHF (combined) Recovered LVEF by last echo *** C/w Dr. Royann Shivers  CAD *** C/w Dr. Royann Shivers    Disposition: F/u with ***  Current medicines are reviewed at length with the patient today.  The patient did not have any concerns regarding medicines.  Norma Fredrickson, PA-C 08/09/2022 12:47 PM     [facility] [address] [place] [address] [phone] (office)  [phone] (fax)

## 2022-08-10 ENCOUNTER — Encounter: Payer: Self-pay | Admitting: Physician Assistant

## 2022-08-10 ENCOUNTER — Ambulatory Visit: Payer: Medicare Other | Attending: Physician Assistant | Admitting: Physician Assistant

## 2022-08-10 VITALS — BP 124/60 | HR 66 | Ht 69.5 in | Wt 161.6 lb

## 2022-08-10 DIAGNOSIS — I5022 Chronic systolic (congestive) heart failure: Secondary | ICD-10-CM

## 2022-08-10 DIAGNOSIS — I2510 Atherosclerotic heart disease of native coronary artery without angina pectoris: Secondary | ICD-10-CM | POA: Diagnosis not present

## 2022-08-10 DIAGNOSIS — Z950 Presence of cardiac pacemaker: Secondary | ICD-10-CM | POA: Diagnosis not present

## 2022-08-10 DIAGNOSIS — I255 Ischemic cardiomyopathy: Secondary | ICD-10-CM

## 2022-08-10 LAB — CUP PACEART INCLINIC DEVICE CHECK
Battery Remaining Longevity: 105 mo
Battery Voltage: 3.19 V
Brady Statistic AP VP Percent: 14.85 %
Brady Statistic AP VS Percent: 0 %
Brady Statistic AS VP Percent: 85.13 %
Brady Statistic AS VS Percent: 0.03 %
Brady Statistic RA Percent Paced: 14.79 %
Brady Statistic RV Percent Paced: 99.97 %
Date Time Interrogation Session: 20231016170743
Implantable Lead Implant Date: 20130418
Implantable Lead Implant Date: 20130418
Implantable Lead Implant Date: 20150312
Implantable Lead Location: 753858
Implantable Lead Location: 753859
Implantable Lead Location: 753860
Implantable Pulse Generator Implant Date: 20231002
Lead Channel Impedance Value: 3344 Ohm
Lead Channel Impedance Value: 3344 Ohm
Lead Channel Impedance Value: 3344 Ohm
Lead Channel Impedance Value: 3344 Ohm
Lead Channel Impedance Value: 3344 Ohm
Lead Channel Impedance Value: 3344 Ohm
Lead Channel Impedance Value: 380 Ohm
Lead Channel Impedance Value: 380 Ohm
Lead Channel Impedance Value: 437 Ohm
Lead Channel Impedance Value: 437 Ohm
Lead Channel Impedance Value: 494 Ohm
Lead Channel Impedance Value: 494 Ohm
Lead Channel Impedance Value: 494 Ohm
Lead Channel Impedance Value: 494 Ohm
Lead Channel Impedance Value: 589 Ohm
Lead Channel Impedance Value: 589 Ohm
Lead Channel Impedance Value: 646 Ohm
Lead Channel Impedance Value: 646 Ohm
Lead Channel Pacing Threshold Amplitude: 0.5 V
Lead Channel Pacing Threshold Amplitude: 0.875 V
Lead Channel Pacing Threshold Amplitude: 0.875 V
Lead Channel Pacing Threshold Amplitude: 1 V
Lead Channel Pacing Threshold Amplitude: 1 V
Lead Channel Pacing Threshold Pulse Width: 0.4 ms
Lead Channel Pacing Threshold Pulse Width: 0.4 ms
Lead Channel Pacing Threshold Pulse Width: 0.4 ms
Lead Channel Pacing Threshold Pulse Width: 0.4 ms
Lead Channel Pacing Threshold Pulse Width: 0.8 ms
Lead Channel Sensing Intrinsic Amplitude: 2.375 mV
Lead Channel Sensing Intrinsic Amplitude: 2.375 mV
Lead Channel Sensing Intrinsic Amplitude: 2.75 mV
Lead Channel Sensing Intrinsic Amplitude: 2.75 mV
Lead Channel Sensing Intrinsic Amplitude: 2.8 mV
Lead Channel Setting Pacing Amplitude: 2 V
Lead Channel Setting Pacing Amplitude: 2 V
Lead Channel Setting Pacing Amplitude: 3.5 V
Lead Channel Setting Pacing Pulse Width: 0.4 ms
Lead Channel Setting Pacing Pulse Width: 0.8 ms
Lead Channel Setting Sensing Sensitivity: 5.6 mV

## 2022-08-10 NOTE — Patient Instructions (Signed)
Medication Instructions:   Your physician recommends that you continue on your current medications as directed. Please refer to the Current Medication list given to you today.   *If you need a refill on your cardiac medications before your next appointment, please call your pharmacy*   Lab Work:  [place]   If you have labs (blood work) drawn today and your tests are completely normal, you will receive your results only by: [place] (if you have MyChart) OR A paper copy in the mail If you have any lab test that is abnormal or we need to change your treatment, we will call you to review the results.   Testing/Procedures: NONE ORDERED  TODAY   Follow-Up: At [facility], you and your health needs are our priority.  As part of our continuing mission to provide you with exceptional heart care, we have created designated Provider Care Teams.  These Care Teams include your primary Cardiologist (physician) and Advanced Practice Providers (APPs -  Physician Assistants and Nurse Practitioners) who all work together to provide you with the care you need, when you need it.  We recommend signing up for the patient portal called "MyChart".  Sign up information is provided on this After Visit Summary.  MyChart is used to connect with patients for Virtual Visits (Telemedicine).  Patients are able to view lab/test results, encounter notes, upcoming appointments, etc.  Non-urgent messages can be sent to your provider as well.   To learn more about what you can do with MyChart, go to [email or web address].    Your next appointment:   ALL SCHEDULED APPOINTMENTS    The format for your next appointment:   In [place]    Other Instructions     Important Information About Sugar

## 2022-08-21 ENCOUNTER — Encounter: Payer: Self-pay | Admitting: Cardiovascular Disease

## 2022-08-21 ENCOUNTER — Ambulatory Visit: Payer: Medicare Other | Attending: Cardiovascular Disease | Admitting: Cardiovascular Disease

## 2022-08-21 VITALS — BP 112/56 | HR 76 | Ht 69.5 in | Wt 165.0 lb

## 2022-08-21 DIAGNOSIS — I5042 Chronic combined systolic (congestive) and diastolic (congestive) heart failure: Secondary | ICD-10-CM

## 2022-08-21 DIAGNOSIS — J449 Chronic obstructive pulmonary disease, unspecified: Secondary | ICD-10-CM

## 2022-08-21 DIAGNOSIS — E785 Hyperlipidemia, unspecified: Secondary | ICD-10-CM | POA: Diagnosis not present

## 2022-08-21 DIAGNOSIS — I2510 Atherosclerotic heart disease of native coronary artery without angina pectoris: Secondary | ICD-10-CM

## 2022-08-21 DIAGNOSIS — I442 Atrioventricular block, complete: Secondary | ICD-10-CM | POA: Diagnosis not present

## 2022-08-21 DIAGNOSIS — I7143 Infrarenal abdominal aortic aneurysm, without rupture: Secondary | ICD-10-CM | POA: Diagnosis not present

## 2022-08-21 DIAGNOSIS — I4719 Other supraventricular tachycardia: Secondary | ICD-10-CM

## 2022-08-21 DIAGNOSIS — I10 Essential (primary) hypertension: Secondary | ICD-10-CM

## 2022-08-21 DIAGNOSIS — E1151 Type 2 diabetes mellitus with diabetic peripheral angiopathy without gangrene: Secondary | ICD-10-CM

## 2022-08-21 DIAGNOSIS — Z950 Presence of cardiac pacemaker: Secondary | ICD-10-CM | POA: Diagnosis not present

## 2022-08-21 MED ORDER — EMPAGLIFLOZIN 10 MG PO TABS: 10.0000 mg | ORAL_TABLET | Freq: Every day | ORAL | 11 refills | Status: DC

## 2022-08-21 NOTE — Patient Instructions (Signed)
Medication Instructions:  START Jardiance 10 mg once daily-assistance papers have been given. Please fill out and return them to the office  STOP Furoesmide only when you start the Jardiance STOP the Pioglitazone (Actos) only when you start the Jardiance  *If you need a refill on your cardiac medications before your next appointment, please call your pharmacy*   Lab Work: None ordered If you have labs (blood work) drawn today and your tests are completely normal, you will receive your results only by: [place] (if you have MyChart) OR A paper copy in the mail If you have any lab test that is abnormal or we need to change your treatment, we will call you to review the results.   Testing/Procedures: None ordered   Follow-Up: At [facility], you and your health needs are our priority.  As part of our continuing mission to provide you with exceptional heart care, we have created designated Provider Care Teams.  These Care Teams include your primary Cardiologist (physician) and Advanced Practice Providers (APPs -  Physician Assistants and Nurse Practitioners) who all work together to provide you with the care you need, when you need it.  We recommend signing up for the patient portal called "MyChart".  Sign up information is provided on this After Visit Summary.  MyChart is used to connect with patients for Virtual Visits (Telemedicine).  Patients are able to view lab/test results, encounter notes, upcoming appointments, etc.  Non-urgent messages can be sent to your provider as well.   To learn more about what you can do with MyChart, go to [email or web address].    Your next appointment:   3 month(s)  The format for your next appointment:   In Person  Provider:   Dr. Sallyanne Kuster

## 2022-08-21 NOTE — Progress Notes (Signed)
Patient ID: [name], male   DOB: Mar 23, 1946, 76 y.o.   MRN: [id number]    Cardiology Office Note    Date:  08/21/2022   ID:  [name], DOB [birth year], MRN [id number]  PCP:  Renford Dills, MD  Cardiologist:  [name], MD:  Thurmon Fair, MD   No chief complaint on file.   History of Present Illness:  [name] is a 76 y.o. male with ischemic cardiomyopathy, chronic systolic and diastolic heart failure, complete heart block, frequent sustained paroxysmal atrial tachycardia and a biventricular pacemaker (new generator August 04, 2022 Medtronic Percepta CRT-P, original leads 2015). He also has advanced chronic obstructive lung disease related to long-standing previous smoking, type 2 diabetes mellitus on oral antidiabetics, small AAA (3 cm by [place] 09/2017), moderate bilateral PAD of lower extremities.  He underwent pacemaker generator change out on 07/27/2022 (Dr. Ladona Ridgel).  The device site has healed well.  Device function is normal by interrogation in the office today with an estimated joint longevity of 8.4 years.  Activity remains steady at about 3 hours a day.  OptiVol has only just now reached maturity.  Has not had any episodes of atrial fibrillation or ventricular tachycardia.  He has 11% atrial pacing and 100% biventricular pacing.  He remains active.  He takes care of of 3 different lawns as well as is on, using both a riding lawnmower and a push mower and doing weed eating.  He is able to split wood and push that in a wheelbarrow to his house.  He denies any problems with chest pain or shortness of breath.  He has not had any dizziness, syncope or palpitations.  He does not have lower extremity edema.  He denies intermittent claudication, but his most recent ABI from during 2022 showed slight reduction on the left side at 0.0.  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  Enjoys his grandson.  He  believes he is more short tempered than he was in the past.  He last underwent evaluation for his small abdominal aortic aneurysm in January 2022 when it measured 3.1 cm, essentially unchanged from previous evaluation.  He also has stable moderate bilateral iliac stenoses that appear to be asymptomatic.  We have decided to perform ultrasound follow-up every 2 years since the findings have been so stable.  He has good metabolic risk factor control.  On statin therapy his recent LDL cholesterol was 66 and HDL 40.  Most recent hemoglobin A1c was borderline at 7.9% (on combination glipizide, metformin, Actos).  His most recent creatinine is normal at 0.74.     He has a long-standing history of coronary disease and underwent bypass surgery in 2007. He had left bundle branch block for years and in 2013 received a dual-chamber permanent pacemaker for complete heart block Just before getting his pacemaker he underwent cardiac catheterization which showed a patent sequential saphenous vein graft bypass to the first and second oblique marginal arteries (both of these vessels had previously been stented and had severe in-stent restenosis).   He has no underlying escape rhythm and is 100% paced. He requires relatively high doses of beta blockers for control of recurrent paroxysmal atrial tachycardia which was quite symptomatic. Left ventricular ejection fraction had been 45-50% prior to pacemaker implantation. Following implantation of his pacemaker his echocardiogram showed moderately depressed left ventricular systolic function with an ejection fraction  of 35-40%. He improved following biventricular pacemaker upgrade, but then had lead dislodgment and deteriorated again. After repositioning of his left ventricular lead, left ventricular ejection fraction increased to 50-55 %- by his most recent echocardiogram in December 2015.   Attempts at increased ACE inhibitor in the past led to severe symptomatic hypotension.   May 2017 had lower extremity arterial Dopplers showing  >50% right external and left common iliac artery stenosis. 30-49% right SFA disease, without focal stenosis. 50-74% left distal SFA stenosis. Occluded bilateral anterior tibial artery disease, two vessel run-off, bilaterally. Bilateral ABI 1.1-1.26 September 2017 AAA 3.0 cm. September 2020 AAA 3.0 cm January 2022 AAA 3.1 cm Right ABI 1.21, left ABI 0.80   Past Medical History:  Diagnosis Date   Atrial tachycardia    Cardiomyopathy (HCC)    mixed ischemic and nonischemic   CHB (complete heart block) (HCC) 02/11/12   Medtronic permanent pacemaker   CHF (congestive heart failure) (HCC)    COPD (chronic obstructive pulmonary disease) (HCC)    Coronary artery disease    DOE (dyspnea on exertion)    NYHA class 2-3   DVT (deep venous thrombosis) (HCC) 12/2005   RLE "had to have OR"   Dyslipidemia    GERD (gastroesophageal reflux disease)    Hypertension    LBBB (left bundle branch block)    Myocardial infarction (HCC)    "I've had 6" (01/03/2014)   Psoriasis    Sleep apnea    c pap at home, does not use   Type II diabetes mellitus (HCC)     Past Surgical History:  Procedure Laterality Date   BI-VENTRICULAR PACEMAKER REVISION N/A 03/06/2014   Procedure: BI-VENTRICULAR PACEMAKER REVISION (CRT-R);  Surgeon: Marinus Maw, MD;  Location: [facility] CATH LAB;  Service: Cardiovascular;  Laterality: N/A;   BIV PACEMAKER GENERATOR CHANGEOUT N/A 07/27/2022   Procedure: BIV PACEMAKER GENERATOR CHANGEOUT;  Surgeon: Marinus Maw, MD;  Location: [facility] INVASIVE CV LAB;  Service: Cardiovascular;  Laterality: N/A;   CARDIAC CATHETERIZATION  02/10/12   left CX: dominant w/40% prox. AV groove stenosis.The first & second marginal branches had stents visualized near the ostium of the vessel bu were functionally occluded. The remainder of the dominant CX had minor irregularities.  RCA: nondominant free of sign. disease. SVG to OM1 & OM2 sequentially was  widely patent.   CATARACT EXTRACTION Left    CORONARY ANGIOPLASTY WITH STENT PLACEMENT     "I had 9 stents before the [facility] in 2007" (01/03/2014)   CORONARY ARTERY BYPASS GRAFT  2007   x 3   ELECTROPHYSIOLOGY STUDY  01/03/2014   EPS by Dr Ladona Ridgel with no inducible ventricular arrhythmias   ELECTROPHYSIOLOGY STUDY N/A 01/03/2014   Procedure: ELECTROPHYSIOLOGY STUDY;  Surgeon: Marinus Maw, MD;  Location: [facility] CATH LAB;  Service: Cardiovascular;  Laterality: N/A;   LEAD REVISION  03-06-2014   LV lead revision by Dr Ladona Ridgel   LEFT HEART CATHETERIZATION WITH CORONARY/GRAFT ANGIOGRAM  02/10/2012   Procedure: LEFT HEART CATHETERIZATION WITH Isabel Caprice;  Surgeon: Runell Gess, MD;  Location: [facility] CATH LAB;  Service: Cardiovascular;;   NM MYOCAR PERF WALL MOTION  10/02/2009   no significant ischemia   PACEMAKER INSERTION  2013; 01/03/2014   MDT dual chamber pacemaker implanted 2013 for complete heart block; upgrade to CRTP (MDT) by Dr Ladona Ridgel 12/2013 due to cardiomyopathy and heart failure   PERMANENT PACEMAKER INSERTION N/A 02/11/2012   Procedure: PERMANENT PACEMAKER INSERTION;  Surgeon: Thurmon Fair, MD;  Location:  [facility] CATH LAB;  Service: Cardiovascular;  Laterality: N/A;   REFRACTIVE SURGERY Right    TEMPORARY PACEMAKER INSERTION Bilateral 02/10/2012   Procedure: TEMPORARY PACEMAKER INSERTION;  Surgeon: Runell Gess, MD;  Location: [facility] CATH LAB;  Service: Cardiovascular;  Laterality: Bilateral;   US ECHOCARDIOGRAPHY  01/10/10   LV systolic fx mod to severely reduced, EF 35-40%, AOV mildly sclerotic    Current Medications: Outpatient Medications Prior to Visit  Medication Sig Dispense Refill   aspirin EC 81 MG tablet Take 81 mg by mouth daily.     benazepril (LOTENSIN) 5 MG tablet Take 5 mg by mouth daily.     clopidogrel (PLAVIX) 75 MG tablet TAKE 1 TABLET BY MOUTH  DAILY 90 tablet 2   furosemide (LASIX) 20 MG tablet TAKE 1 TABLET BY MOUTH  DAILY 90 tablet 3   glipiZIDE (GLUCOTROL  XL) 5 MG 24 hr tablet Take 5 mg by mouth in the morning and at bedtime.     isosorbide mononitrate (IMDUR) 30 MG 24 hr tablet TAKE 1 TABLET BY MOUTH  DAILY 30 tablet 1   metFORMIN (GLUCOPHAGE) 1000 MG tablet Take 1 tablet (1,000 mg total) by mouth 2 (two) times daily with a meal. HOLD for 2 days. Restart 03/10/2014.     metoprolol tartrate (LOPRESSOR) 100 MG tablet TAKE 1 TABLET BY MOUTH TWO  TIMES DAILY 180 tablet 2   pantoprazole (PROTONIX) 40 MG tablet Take 40 mg by mouth daily as needed (acid reflux).      pioglitazone (ACTOS) 15 MG tablet Take 15 mg by mouth daily.     rosuvastatin (CRESTOR) 10 MG tablet TAKE 1 TABLET BY MOUTH  DAILY 90 tablet 3   nitroGLYCERIN (NITROSTAT) 0.4 MG SL tablet Place 1 tablet (0.4 mg total) under the tongue every 5 (five) minutes as needed for chest pain. (Patient not taking: Reported on 08/21/2022) 25 tablet 6   No facility-administered medications prior to visit.     Allergies:   Pollen extract   Social History   Socioeconomic History   Marital status: Married    Spouse name: Not on file   Number of children: Not on file   Years of education: Not on file   Highest education level: Not on file  Occupational History   Not on file  Tobacco Use   Smoking status: Former    Packs/day: 15.00    Years: 36.00    Total pack years: 540.00    Types: Cigars, Cigarettes    Quit date: 05/25/2005    Years since quitting: 17.2   Smokeless tobacco: Never   Tobacco comments:    15 cigars/daily when smoking  Substance and Sexual Activity   Alcohol use: Yes    Alcohol/week: 0.0 standard drinks of alcohol    Comment: "quit alcohol in 1985 or 1986"   Drug use: No   Sexual activity: Not Currently  Other Topics Concern   Not on file  Social History Narrative   Not on file   Social Determinants of Health   Financial Resource Strain: Not on file  Food Insecurity: Not on file  Transportation Needs: Not on file  Physical Activity: Not on file  Stress: Not on  file  Social Connections: Not on file     Family History:  The patient's family history includes Cancer in his father.   ROS:   Please see the history of present illness.    ROS All other systems are reviewed and are negative.  PHYSICAL EXAM:  VS:  BP (!) 112/56 (BP Location: Left Arm, Patient Position: Sitting, Cuff Size: Normal)   Pulse 76   Ht 5' 9.5" (1.765 m)   Wt 74.8 kg   SpO2 92%   BMI 24.02 kg/m       General: Alert, oriented x3, no distress, well-healed left subclavian pacemaker site.  Appears younger than stated age. Head: no evidence of trauma, PERRL, EOMI, no exophtalmos or lid lag, no myxedema, no xanthelasma; normal ears, nose and oropharynx Neck: normal jugular venous pulsations and no hepatojugular reflux; brisk carotid pulses without delay and no carotid bruits Chest: clear to auscultation, no signs of consolidation by percussion or palpation, normal fremitus, symmetrical and full respiratory excursions Cardiovascular: normal position and quality of the apical impulse, regular rhythm, normal first and paradoxically split second heart sounds, no murmurs, rubs or gallops Abdomen: no tenderness or distention, no masses by palpation, no abnormal pulsatility or arterial bruits, normal bowel sounds, no hepatosplenomegaly Extremities: no clubbing, cyanosis or edema; 2+ radial, ulnar and brachial pulses bilaterally; 2+ right femoral, posterior tibial and dorsalis pedis pulses; 2+ left femoral, posterior tibial and dorsalis pedis pulses; no subclavian or femoral bruits Neurological: grossly nonfocal Psych: Normal mood and affect     Wt Readings from Last 3 Encounters:  08/21/22 74.8 kg  08/10/22 73.3 kg  07/27/22 73 kg      Studies/Labs Reviewed:   EKG:  EKG is not ordered today.  Most recent tracing from 07/16/2022 is personally reviewed and shows a sensed, BiV paced rhythm with a very tall R wave in lead V1, but with a broad QRS at 166 ms. Recent  Labs:  09/27/2020 Hemoglobin 14.8, creatinine 0.79, potassium 4.9, normal liver function tests, hemoglobin A1c 7.5% Total cholesterol 141, HDL 42, LDL 68, triglycerides 181  05/12/2022 Hemoglobin A1c 7.9% Cholesterol 136, HDL 40, LDL 66, triglycerides 173 TSH 1.46  07/17/2019. Hemoglobin 13.3, creatinine 0.74, potassium 4.5   ASSESSMENT:    1. Chronic combined systolic and diastolic CHF, NYHA class 3 (HCC)   2. CHB (complete heart block) (HCC)   3. Essential hypertension   4. PAT (paroxysmal atrial tachycardia)   5. Coronary artery disease involving native coronary artery of native heart without angina pectoris   6. Chronic obstructive pulmonary disease, unspecified COPD type (HCC)   7. Status post biventricular pacemaker   8. Dyslipidemia   9. Diabetes mellitus with peripheral vascular disease (HCC)   10. Infrarenal abdominal aortic aneurysm (AAA) without rupture (HCC)      PLAN:  In order of problems listed above:  CHF: Doing well, NYHA functional class I and clinically euvolemic on a very low-dose of loop diuretic.  He has recovered LVEF following CRT-D.  He did not tolerate higher doses of ACE inhibitor due to hypotension and is unlikely to tolerate Entresto.  Would like him to be on SGLT2 inhibitor which would have multiple benefits compared to his current regimen that includes glipizide and pioglitazone.  Cost was a problem when this was prescribed in the past.  We will try to get him qualified for patient assistance.  In the past, his OptiVol-thoracic impedance does not appear to correlate too well with his symptoms.  CHB: He does not have escape rhythm.  Pacemaker dependent. HTN: Very well controlled. PAT: He has not had overt atrial fibrillation many years of monitoring with his pacemaker.  On aspirin and clopidogrel for PAD. CAD: Asymptomatic.  No angina pectoris in many years.  Known inferior scar by previous nuclear  stress testing, not performed since 2015.  Has not  required cardiac catheterization since 2013. COPD: On bronchodilators.  No wheezing noted today.  Quit smoking when he had his first MI. CRT-P: Generator change out.  Site healed well.  Has not derive great benefit from BiV pacing.  Monitored by Dr. Lovena Le. HLP: Lipid parameters are in desirable range. DM, complicated by PAD: Would like to start him on Jardiance.  If he starts this medication we will discontinue furosemide and pioglitazone.  May be able to also cut back on glipizide. PAD: Asymptomatic.  Slightly reduced ABI on the left.  Extensive infrapopliteal disease as well as iliac artery stenoses.  He does not have claudication at this time. AAA: Infrarenal fusiform dilation of the distal aorta last measured 3.1 cm, essentially unchanged over the last 5 years.      Medication Adjustments/Labs and Tests Ordered: Current medicines are reviewed at length with the patient today.  Concerns regarding medicines are outlined above.  Medication changes, Labs and Tests ordered today are listed in the Patient Instructions below. Patient Instructions  Medication Instructions:  START Jardiance 10 mg once daily-assistance papers have been given. Please fill out and return them to the office  STOP Furoesmide only when you start the Jardiance STOP the Pioglitazone (Actos) only when you start the Jardiance  *If you need a refill on your cardiac medications before your next appointment, please call your pharmacy*   Lab Work: None ordered If you have labs (blood work) drawn today and your tests are completely normal, you will receive your results only by: [place] (if you have MyChart) OR A paper copy in the mail If you have any lab test that is abnormal or we need to change your treatment, we will call you to review the results.   Testing/Procedures: None ordered   Follow-Up: At [facility], you and your health needs are our priority.  As part of our continuing mission to  provide you with exceptional heart care, we have created designated Provider Care Teams.  These Care Teams include your primary Cardiologist (physician) and Advanced Practice Providers (APPs -  Physician Assistants and Nurse Practitioners) who all work together to provide you with the care you need, when you need it.  We recommend signing up for the patient portal called "MyChart".  Sign up information is provided on this After Visit Summary.  MyChart is used to connect with patients for Virtual Visits (Telemedicine).  Patients are able to view lab/test results, encounter notes, upcoming appointments, etc.  Non-urgent messages can be sent to your provider as well.   To learn more about what you can do with MyChart, go to [email or web address].    Your next appointment:   3 month(s)  The format for your next appointment:   In Person  Provider:   Dr. Sallyanne Kuster    Signed, Sanda Klein, MD  08/21/2022 10:30 AM    [facility] [place], [place], [place]  [address] Phone: [phone]; Fax: [phone]

## 2022-09-03 ENCOUNTER — Telehealth: Payer: Self-pay | Admitting: *Deleted

## 2022-09-03 NOTE — Telephone Encounter (Signed)
Jardiance patient assistance faxed to [facility]

## 2022-09-16 ENCOUNTER — Telehealth: Payer: Self-pay | Admitting: Cardiovascular Disease

## 2022-09-16 NOTE — Telephone Encounter (Signed)
Device function is normal.  AS/BV paced presenting rhythm.  No episodes noted. AP 1.8% BIV paced 99.9%  Optivol appears to be increasing.

## 2022-09-16 NOTE — Telephone Encounter (Signed)
Agree we need that additional info re: HR, BP, weight. He did not mention edema? Can he do an extra pacer download, please?

## 2022-09-16 NOTE — Telephone Encounter (Signed)
Pt c/o Shortness Of Breath: STAT if SOB developed within the last 24 hours or pt is noticeably SOB on the phone  1. Are you currently SOB (can you hear that pt is SOB on the phone)? No   2. How long have you been experiencing SOB? Since he had pacemaker change 10/02  3. Are you SOB when sitting or when up moving around? Moving around   4. Are you currently experiencing any other symptoms? No

## 2022-09-16 NOTE — Telephone Encounter (Signed)
I attempted to contact patient multiple times, unable to reach but will try again before leaving this afternoon.

## 2022-09-16 NOTE — Telephone Encounter (Signed)
Attempted to contact patient, line was busy- will attempt later.   Thanks!

## 2022-09-16 NOTE — Telephone Encounter (Signed)
Outreach made to Pt.  Left detailed message requesting Pt send a remote transmission.  Left direct number to device clinic to call back if assistance needed.

## 2022-09-16 NOTE — Telephone Encounter (Signed)
Called patient, advised of message from Dr.C-   Patient will increase to 40 mg of lasix, and call back on Monday with an update.

## 2022-09-16 NOTE — Telephone Encounter (Signed)
I contacted patient, see another triage message.   He is going to attempt to call patient assistance since the forms were faxed off.

## 2022-09-16 NOTE — Telephone Encounter (Signed)
Called patient, he states that he has been SOB since having his change on 10/02. He denies chest palpitations, chest pain, swelling (however, does mention he is gaining weight) we did advise of his diet (he eats a lot of microwave meals) we did discuss to eat better, limit salt/sodium intake. Patient states he is going to do this, he is not aware of his recent weight, but will start weighing today. He does not have any recent BP/HR readings, we did discuss to start taking these as well as the weight log. Patient does take Lasix 20 mg daily as on medication list.   Patient states he also request information on his forms for patient assistance- per last message from [facility], RN she faxed over information on 11/09- I advised with him to call the patient assistance foundation to see about a update- if I got a chance to call today I would. Patient thankful for call back.   Will send to MD for any further recommendations.

## 2022-09-16 NOTE — Telephone Encounter (Signed)
Thanks, [name]. [name], please tell him the pacemaker is functioning well, but he does appear to have some extra fluid building up. Please increase furosemide to 40 mg daily until the end of the holiday weekend and touch base again on Monday.  Thank you!

## 2022-09-16 NOTE — Telephone Encounter (Signed)
Pt is requesting call back to get update on forms he brought in to office.

## 2022-09-21 ENCOUNTER — Telehealth: Payer: Self-pay | Admitting: Cardiovascular Disease

## 2022-09-21 DIAGNOSIS — I5042 Chronic combined systolic (congestive) and diastolic (congestive) heart failure: Secondary | ICD-10-CM

## 2022-09-21 DIAGNOSIS — R0602 Shortness of breath: Secondary | ICD-10-CM

## 2022-09-21 NOTE — Telephone Encounter (Signed)
Returned call to patient phone rings busy.Called cell # listed no answer.No voice mail.

## 2022-09-21 NOTE — Telephone Encounter (Signed)
Pt c/o Shortness Of Breath: STAT if SOB developed within the last 24 hours or pt is noticeably SOB on the phone  1. Are you currently SOB (can you hear that pt is SOB on the phone)? No  2. How long have you been experiencing SOB? Last week  3. Are you SOB when sitting or when up moving around? Both  4. Are you currently experiencing any other symptoms? No  Pt states after increasing Lasix (per note on 11/22) he continues to have SOB. Please advise

## 2022-09-21 NOTE — Telephone Encounter (Signed)
Please schedule for echo and first available appt.

## 2022-09-21 NOTE — Telephone Encounter (Signed)
Spoke to patient he stated he is still sob.Stated he doubled lasix dose to 40 mg daily on 11/23.Stated he never noticed any difference.Weight stable.Advised I will send message to Dr.Croitoru for advice.

## 2022-09-21 NOTE — Telephone Encounter (Signed)
Spoke to patient Dr.Croitoru's advice given.Echo scheduled tomorrow 11/28 at 11:30 am at [facility] office.Appointment scheduled with Dr.Croitoru 12/7 at 3:30 pm.

## 2022-09-22 ENCOUNTER — Ambulatory Visit (HOSPITAL_COMMUNITY): Payer: Medicare Other | Attending: Cardiovascular Disease

## 2022-09-22 DIAGNOSIS — R0602 Shortness of breath: Secondary | ICD-10-CM | POA: Diagnosis not present

## 2022-09-22 DIAGNOSIS — I5042 Chronic combined systolic (congestive) and diastolic (congestive) heart failure: Secondary | ICD-10-CM | POA: Diagnosis not present

## 2022-09-22 LAB — ECHOCARDIOGRAM COMPLETE
Area-P 1/2: 3.17 cm2
S' Lateral: 3.9 cm

## 2022-09-24 ENCOUNTER — Encounter: Payer: Self-pay | Admitting: *Deleted

## 2022-10-01 ENCOUNTER — Ambulatory Visit: Payer: Medicare Other | Attending: Cardiovascular Disease | Admitting: Cardiovascular Disease

## 2022-10-01 ENCOUNTER — Encounter: Payer: Self-pay | Admitting: Cardiovascular Disease

## 2022-10-01 VITALS — BP 139/60 | HR 68 | Ht 69.5 in | Wt 165.6 lb

## 2022-10-01 DIAGNOSIS — I7143 Infrarenal abdominal aortic aneurysm, without rupture: Secondary | ICD-10-CM | POA: Diagnosis not present

## 2022-10-01 DIAGNOSIS — I25810 Atherosclerosis of coronary artery bypass graft(s) without angina pectoris: Secondary | ICD-10-CM | POA: Diagnosis not present

## 2022-10-01 DIAGNOSIS — J449 Chronic obstructive pulmonary disease, unspecified: Secondary | ICD-10-CM | POA: Diagnosis not present

## 2022-10-01 DIAGNOSIS — I10 Essential (primary) hypertension: Secondary | ICD-10-CM

## 2022-10-01 DIAGNOSIS — I442 Atrioventricular block, complete: Secondary | ICD-10-CM | POA: Diagnosis not present

## 2022-10-01 DIAGNOSIS — I4719 Other supraventricular tachycardia: Secondary | ICD-10-CM | POA: Diagnosis not present

## 2022-10-01 DIAGNOSIS — Z950 Presence of cardiac pacemaker: Secondary | ICD-10-CM

## 2022-10-01 DIAGNOSIS — E782 Mixed hyperlipidemia: Secondary | ICD-10-CM | POA: Diagnosis not present

## 2022-10-01 DIAGNOSIS — I5042 Chronic combined systolic (congestive) and diastolic (congestive) heart failure: Secondary | ICD-10-CM

## 2022-10-01 DIAGNOSIS — E1151 Type 2 diabetes mellitus with diabetic peripheral angiopathy without gangrene: Secondary | ICD-10-CM | POA: Diagnosis not present

## 2022-10-01 MED ORDER — FUROSEMIDE 20 MG PO TABS: 40.0000 mg | ORAL_TABLET | Freq: Every day | ORAL | 3 refills | Status: AC | PRN

## 2022-10-01 MED ORDER — EMPAGLIFLOZIN 10 MG PO TABS: 10.0000 mg | ORAL_TABLET | Freq: Every day | ORAL | 11 refills | Status: DC

## 2022-10-01 NOTE — Progress Notes (Signed)
Patient ID: [name], male   DOB: 1946/02/07, 76 y.o.   MRN: [id number]    Cardiology Office Note    Date:  10/01/2022   ID:  [name], DOB 12-Aug-1946, MRN [id number]  PCP:  Wesley Dills, MD  Cardiologist:  Wesley Bunting, MD:  Wesley Fair, MD   Chief Complaint  Patient presents with   Congestive Heart Failure    History of Present Illness:  [name] is a 76 y.o. male with ischemic cardiomyopathy, chronic systolic and diastolic heart failure, complete heart block, frequent sustained paroxysmal atrial tachycardia and a biventricular pacemaker (new generator August 04, 2022 Medtronic Percepta CRT-P, original leads 2015). He also has advanced chronic obstructive lung disease related to long-standing previous smoking, type 2 diabetes mellitus on oral antidiabetics, small AAA (3 cm by [place] 09/2017), moderate bilateral PAD of lower extremities.  He called about 2 weeks ago with complaints of worsening shortness of breath.  His OptiVol showed that he had evidence of hypervolemia.  We asked him to increase his furosemide to 40 mg daily starting on 09/17/2022.  There has been partial improvement in his shortness of breath.  His OptiVol has actually returned back to normal range on device interrogation performed today.  Biventricular pacing efficiency is 99.9%.  He only has 1.6% atrial pacing.  He had a handful of very brief episodes of atrial mode switch but no atrial fibrillation.  Estimated generator longevity is 8.2 years.  All lead parameters appear unchanged and in good range.   His echocardiogram, which was performed roughly 5 days after he increased his dose of diuretic actually shows improved LVEF at 55 to 60% and borderline findings of high left heart filling pressures (E/e'=13).  He denies lower extremity edema or intermittent claudication.  He does not have orthopnea or PND.  He has not had palpitations, dizziness or syncope.  He denies chest pain at rest or with activity.  He  last underwent evaluation for his small abdominal aortic aneurysm in January 2022 when it measured 3.1 cm, essentially unchanged from previous evaluation.  He also has stable moderate bilateral iliac stenoses that appear to be asymptomatic.  We have decided to perform ultrasound follow-up every 2 years since the findings have been so stable.  He has good metabolic risk factor control.  On statin therapy his recent LDL cholesterol was 66 and HDL 40.  Most recent hemoglobin A1c was borderline at 7.9% (on combination glipizide, metformin, Actos).  His most recent creatinine is normal at 0.74.  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  Enjoys his grandson.  He believes he is more short tempered than he was in the past.   He has a long-standing history of coronary disease and underwent bypass surgery in 2007. He had left bundle branch block for years and in 2013 received a dual-chamber permanent pacemaker for complete heart block Just before getting his pacemaker he underwent cardiac catheterization which showed a patent sequential saphenous vein graft bypass to the first and second oblique marginal arteries (both of these vessels had previously been stented and had severe in-stent restenosis).   He has no underlying escape rhythm and is 100% paced. He requires relatively high doses of beta blockers for control of recurrent paroxysmal atrial tachycardia which was quite symptomatic. Left ventricular ejection fraction had been 45-50% prior to pacemaker implantation. Following implantation of his pacemaker his  echocardiogram showed moderately depressed left ventricular systolic function with an ejection fraction of 35-40%. He improved following biventricular pacemaker upgrade, but then had lead dislodgment and deteriorated again. After repositioning of his left ventricular lead, left ventricular ejection fraction increased to 50-55  %- by his most recent echocardiogram in December 2015.   Attempts at increased ACE inhibitor in the past led to severe symptomatic hypotension.  May 2017 had lower extremity arterial Dopplers showing  >50% right external and left common iliac artery stenosis. 30-49% right SFA disease, without focal stenosis. 50-74% left distal SFA stenosis. Occluded bilateral anterior tibial artery disease, two vessel run-off, bilaterally. Bilateral ABI 1.1-1.26 September 2017 AAA 3.0 cm. September 2020 AAA 3.0 cm January 2022 AAA 3.1 cm Right ABI 1.21, left ABI 0.80   Past Medical History:  Diagnosis Date   Atrial tachycardia    Cardiomyopathy (HCC)    mixed ischemic and nonischemic   CHB (complete heart block) (HCC) 02/11/12   Medtronic permanent pacemaker   CHF (congestive heart failure) (HCC)    COPD (chronic obstructive pulmonary disease) (HCC)    Coronary artery disease    DOE (dyspnea on exertion)    NYHA class 2-3   DVT (deep venous thrombosis) (HCC) 12/2005   RLE "had to have OR"   Dyslipidemia    GERD (gastroesophageal reflux disease)    Hypertension    LBBB (left bundle branch block)    Myocardial infarction (HCC)    "I've had 6" (01/03/2014)   Psoriasis    Sleep apnea    c pap at home, does not use   Type II diabetes mellitus (HCC)     Past Surgical History:  Procedure Laterality Date   BI-VENTRICULAR PACEMAKER REVISION N/A 03/06/2014   Procedure: BI-VENTRICULAR PACEMAKER REVISION (CRT-R);  Surgeon: Marinus Maw, MD;  Location[facility] C[facility] LAB;  Service: Cardiovascular;  Laterality: N/A;   BIV PACEMAKER GENERATOR CHANGEOUT N/A 07/27/2022   Procedure: BIV PACEMAKER GENERATOR CHANGEOUT;  Surgeon: Marinus Maw, MD;  Location: [facility] INVASIVE CV LAB;  Service: Cardiovascular;  Laterality: N/A;   CARDIAC CATHETERIZATION  02/10/12   left CX: dominant w/40% prox. AV groove stenosis.The first & second marginal branches had stents visualized near the ostium of the vessel bu were functionally  occluded. The remainder of the dominant CX had minor irregularities.  RCA: nondominant free of sign. disease. SVG to OM1 & OM2 sequentially was widely patent.   CATARACT EXTRACTION Left    CORONARY ANGIOPLASTY WITH STENT PLACEMENT     "I had 9 stents before the [facility] in 2007" (01/03/2014)   CORONARY ARTERY BYPASS GRAFT  2007   x 3   ELECTROPHYSIOLOGY STUDY  01/03/2014   EPS by Dr Ladona Ridgel with no inducible ventricular arrhythmias   ELECTROPHYSIOLOGY STUDY N/A 01/03/2014   Procedure: ELECTROPHYSIOLOGY STUDY;  Surgeon: Marinus Maw, MD;  Location: [facility] C[facility] LAB;  Service: Cardiovascular;  Laterality: N/A;   LEAD REVISION  03-06-2014   LV lead revision by Dr Ladona Ridgel   LEFT HEART CATHETERIZATION WITH CORONARY/GRAFT ANGIOGRAM  02/10/2012   Procedure: LEFT HEART CATHETERIZATION WITH Isabel Caprice;  Surgeon: Runell Gess, MD;  Location: [facility] CATH LAB;  Service: Cardiovascular;;   NM MYOCAR PERF WALL MOTION  10/02/2009   no significant ischemia   PACEMAKER INSERTION  2013; 01/03/2014   MDT dual chamber pacemaker implanted 2013 for complete heart block; upgrade to CRTP (MDT) by Dr Ladona Ridgel 12/2013 due to cardiomyopathy and heart failure   PERMANENT PACEMAKER INSERTION N/A 02/11/2012  Procedure: PERMANENT PACEMAKER INSERTION;  Surgeon: Wesley Fair, MD;  Location: [facility] CATH LAB;  Service: Cardiovascular;  Laterality: N/A;   REFRACTIVE SURGERY Right    TEMPORARY PACEMAKER INSERTION Bilateral 02/10/2012   Procedure: TEMPORARY PACEMAKER INSERTION;  Surgeon: Runell Gess, MD;  Location: [facility] CATH LAB;  Service: Cardiovascular;  Laterality: Bilateral;   US ECHOCARDIOGRAPHY  01/10/10   LV systolic fx mod to severely reduced, EF 35-40%, AOV mildly sclerotic    Current Medications: Outpatient Medications Prior to Visit  Medication Sig Dispense Refill   aspirin EC 81 MG tablet Take 81 mg by mouth daily.     benazepril (LOTENSIN) 5 MG tablet Take 5 mg by mouth daily.     clopidogrel (PLAVIX) 75 MG tablet  TAKE 1 TABLET BY MOUTH  DAILY 90 tablet 2   glipiZIDE (GLUCOTROL XL) 5 MG 24 hr tablet Take 5 mg by mouth in the morning and at bedtime.     isosorbide mononitrate (IMDUR) 30 MG 24 hr tablet TAKE 1 TABLET BY MOUTH  DAILY 30 tablet 1   metFORMIN (GLUCOPHAGE) 1000 MG tablet Take 1 tablet (1,000 mg total) by mouth 2 (two) times daily with a meal. HOLD for 2 days. Restart 03/10/2014.     metoprolol tartrate (LOPRESSOR) 100 MG tablet TAKE 1 TABLET BY MOUTH TWO  TIMES DAILY 180 tablet 2   pantoprazole (PROTONIX) 40 MG tablet Take 40 mg by mouth daily as needed (acid reflux).      rosuvastatin (CRESTOR) 10 MG tablet TAKE 1 TABLET BY MOUTH  DAILY 90 tablet 3   furosemide (LASIX) 20 MG tablet TAKE 1 TABLET BY MOUTH  DAILY 90 tablet 3   pioglitazone (ACTOS) 15 MG tablet Take 15 mg by mouth daily.     nitroGLYCERIN (NITROSTAT) 0.4 MG SL tablet Place 1 tablet (0.4 mg total) under the tongue every 5 (five) minutes as needed for chest pain. (Patient not taking: Reported on 10/01/2022) 25 tablet 6   empagliflozin (JARDIANCE) 10 MG TABS tablet Take 1 tablet (10 mg total) by mouth daily before breakfast. (Patient not taking: Reported on 10/01/2022) 30 tablet 11   No facility-administered medications prior to visit.     Allergies:   Pollen extract   Social History   Socioeconomic History   Marital status: Married    Spouse name: Not on file   Number of children: Not on file   Years of education: Not on file   Highest education level: Not on file  Occupational History   Not on file  Tobacco Use   Smoking status: Former    Packs/day: 15.00    Years: 36.00    Total pack years: 540.00    Types: Cigars, Cigarettes    Quit date: 05/25/2005    Years since quitting: 17.3   Smokeless tobacco: Never   Tobacco comments:    15 cigars/daily when smoking  Substance and Sexual Activity   Alcohol use: Yes    Alcohol/week: 0.0 standard drinks of alcohol    Comment: "quit alcohol in 1985 or 1986"   Drug use: No    Sexual activity: Not Currently  Other Topics Concern   Not on file  Social History Narrative   Not on file   Social Determinants of Health   Financial Resource Strain: Not on file  Food Insecurity: Not on file  Transportation Needs: Not on file  Physical Activity: Not on file  Stress: Not on file  Social Connections: Not on file  Family History:  The patient's family history includes Cancer in his father.   ROS:   Please see the history of present illness.    ROS All other systems are reviewed and are negative.  PHYSICAL EXAM:   VS:  BP 139/60   Pulse 68   Ht 5' 9.5" (1.765 m)   Wt 75.1 kg   SpO2 95%   BMI 24.10 kg/m       General: Alert, oriented x3, no distress, well-healed left subclavian pacemaker site Head: no evidence of trauma, PERRL, EOMI, no exophtalmos or lid lag, no myxedema, no xanthelasma; normal ears, nose and oropharynx Neck: normal jugular venous pulsations and no hepatojugular reflux; brisk carotid pulses without delay and no carotid bruits Chest: clear to auscultation, no signs of consolidation by percussion or palpation, normal fremitus, symmetrical and full respiratory excursions Cardiovascular: normal position and quality of the apical impulse, regular rhythm, normal first and second heart sounds, no murmurs, rubs or gallops Abdomen: no tenderness or distention, no masses by palpation, no abnormal pulsatility or arterial bruits, normal bowel sounds, no hepatosplenomegaly Extremities: no clubbing, cyanosis or edema; 2+ radial, ulnar and brachial pulses bilaterally; 2+ right femoral, posterior tibial and dorsalis pedis pulses; 2+ left femoral, posterior tibial and dorsalis pedis pulses; no subclavian or femoral bruits Neurological: grossly nonfocal Psych: Normal mood and affect      Wt Readings from Last 3 Encounters:  10/01/22 75.1 kg  08/21/22 74.8 kg  08/10/22 73.3 kg      Studies/Labs Reviewed:   EKG:  EKG is not ordered today.   Intracardiac electrogram shows atrial sensed, biventricular paced rhythm.   Most recent tracing from 07/16/2022 is personally reviewed and shows a sensed, BiV paced rhythm with a very tall R wave in lead V1, but with a broad QRS at 166 ms. Recent Labs:  09/27/2020 Hemoglobin 14.8, creatinine 0.79, potassium 4.9, normal liver function tests, hemoglobin A1c 7.5% Total cholesterol 141, HDL 42, LDL 68, triglycerides 181  05/12/2022 Hemoglobin A1c 7.9% Cholesterol 136, HDL 40, LDL 66, triglycerides 173 TSH 1.46  07/17/2019. Hemoglobin 13.3, creatinine 0.74, potassium 4.5   ASSESSMENT:    1. Chronic combined systolic and diastolic CHF, NYHA class 3 (HCC)   2. CHB (complete heart block) (HCC)   3. Essential hypertension   4. PAT (paroxysmal atrial tachycardia)   5. Coronary artery disease involving coronary bypass graft of native heart without angina pectoris   6. Chronic obstructive pulmonary disease, unspecified COPD type (HCC)   7. Biventricular cardiac pacemaker in situ   8. Mixed hyperlipidemia   9. Diabetes mellitus with peripheral vascular disease (HCC)   10. Infrarenal abdominal aortic aneurysm (AAA) without rupture (HCC)       PLAN:  In order of problems listed above:  CHF: LVEF has normalized but despite that he has had some evidence of hypervolemia.  I think we need to stop his Actos.  Gave him samples for Jardiance 10 mg once daily.  We have already applied for assistance from the manufacturer, but have yet to hear back.  As he is starting Jardiance, will stop his Actos and stop his diuretic.  I think the net result of all these changes will be beneficial with additional diuresis (he is still roughly 5-6 pounds above what is our best guess for his dry weight around 160 pounds).  He can take the loop diuretic as needed if his weight is 166 pounds.  He has recovered LVEF following CRT-D.  He did not  tolerate higher doses of ACE inhibitor due to hypotension and is unlikely to  tolerate Entresto.   CHB: He does not have escape rhythm.  Pacemaker dependent. HTN: Blood pressure is borderline high today, but usually better controlled. PAT: He has not had overt atrial fibrillation during many years of monitoring with his pacemaker.  On aspirin and clopidogrel for PAD. CAD: He has not had angina pectoris in very many years. Known inferior scar by previous nuclear stress testing, not performed since 2015.  Has not required cardiac catheterization since 2013. COPD: On bronchodilators and today he has no wheezing.  He has not smoked since his first episode of myocardial infarction. CRT-P: Normalization of LVEF with biventricular pacing. HLP: All lipid parameters are in desirable range on statin therapy.  Continue DM, complicated by PAD: Stop Actos.  Start [place].  Continue glipizide for now. PAD: He does not have intermittent claudication jesting good collateral formation.  Slightly reduced ABI on the left.  Extensive infrapopliteal disease as well as iliac artery stenoses.   AAA: Infrarenal fusiform dilation of the distal aorta last measured 3.1 cm, essentially unchanged over the last 5 years.  Last evaluation was in January 2022.  Should reevaluate next year.  We reviewed the potential side effects of SGLT2 inhibitors including increased risk of urinary tract infections, groin yeast infections, low risk of severe Fournier's gangrene.  He should stop the medication immediately if he develops any sign of peritoneal tenderness, redness or other symptoms of infection.    Medication Adjustments/Labs and Tests Ordered: Current medicines are reviewed at length with the patient today.  Concerns regarding medicines are outlined above.  Medication changes, Labs and Tests ordered today are listed in the Patient Instructions below. Patient Instructions  Medication Instructions:   START: JARDIANCE 10mg  ONCE DAILY   STOP: PIOGLITAZONE (ACTOS) WHEN YOU START JARDIANCE   STOP:  FUROSEMIDE DAILY WHEN YOU START JARDIANCE   YOU MAY TAKE FUROSEMIDE 40MG  DAILY ONLY AS NEEDED FOR SWELLING, SHORTNESS OF BREATH AND WEIGHT GREATER THAN 166LBS  *If you need a refill on your cardiac medications before your next appointment, please call your pharmacy*  Lab Work: None Ordered At This Time.  If you have labs (blood work) drawn today and your tests are completely normal, you will receive your results only by: MyChart Message (if you have MyChart) OR A paper copy in the mail If you have any lab test that is abnormal or we need to change your treatment, we will call you to review the results.  Testing/Procedures: None Ordered At This Time.   Follow-Up: At [facility], you and your health needs are our priority.  As part of our continuing mission to provide you with exceptional heart care, we have created designated Provider Care Teams.  These Care Teams include your primary Cardiologist (physician) and Advanced Practice Providers (APPs -  Physician Assistants and Nurse Practitioners) who all work together to provide you with the care you need, when you need it.  Your next appointment:   AS SCHEDULED   The format for your next appointment:   In Person  Provider:   Thurmon Fair, MD            Signed, Wesley Fair, MD  10/01/2022 6:46 PM    [facility] [address] [place], [place], [place]  [address] Phone: [phone]; Fax: [phone]

## 2022-10-01 NOTE — Patient Instructions (Signed)
Medication Instructions:   START: JARDIANCE 10mg  ONCE DAILY   STOP: PIOGLITAZONE (ACTOS) WHEN YOU START JARDIANCE   STOP: FUROSEMIDE DAILY WHEN YOU START JARDIANCE   YOU MAY TAKE FUROSEMIDE 40MG  DAILY ONLY AS NEEDED FOR SWELLING, SHORTNESS OF BREATH AND WEIGHT GREATER THAN 166LBS  *If you need a refill on your cardiac medications before your next appointment, please call your pharmacy*  Lab Work: None Ordered At This Time.  If you have labs (blood work) drawn today and your tests are completely normal, you will receive your results only by: MyChart Message (if you have MyChart) OR A paper copy in the mail If you have any lab test that is abnormal or we need to change your treatment, we will call you to review the results.  Testing/Procedures: None Ordered At This Time.   Follow-Up: At [facility], you and your health needs are our priority.  As part of our continuing mission to provide you with exceptional heart care, we have created designated Provider Care Teams.  These Care Teams include your primary Cardiologist (physician) and Advanced Practice Providers (APPs -  Physician Assistants and Nurse Practitioners) who all work together to provide you with the care you need, when you need it.  Your next appointment:   AS SCHEDULED   The format for your next appointment:   In Person  Provider:   , MD

## 2022-10-12 ENCOUNTER — Telehealth: Payer: Self-pay | Admitting: Cardiovascular Disease

## 2022-10-12 MED ORDER — EMPAGLIFLOZIN 10 MG PO TABS: 10.0000 mg | ORAL_TABLET | Freq: Every day | ORAL | 11 refills | Status: DC

## 2022-10-12 NOTE — Telephone Encounter (Signed)
*  STAT* If patient is at the pharmacy, call can be transferred to refill team.   1. Which medications need to be refilled? (please list name of each medication and dose if known) empagliflozin (JARDIANCE) 10 MG TABS tablet   2. Which pharmacy/location (including street and city if local pharmacy) is medication to be sent to? [facility] [address], [place] - [address]   3. Do they need a 30 day or 90 day supply? 90

## 2022-10-27 ENCOUNTER — Ambulatory Visit (INDEPENDENT_AMBULATORY_CARE_PROVIDER_SITE_OTHER): Payer: Medicare Other

## 2022-10-27 DIAGNOSIS — I442 Atrioventricular block, complete: Secondary | ICD-10-CM | POA: Diagnosis not present

## 2022-10-29 LAB — CUP PACEART REMOTE DEVICE CHECK
Battery Remaining Longevity: 100 mo
Battery Voltage: 3.13 V
Brady Statistic AP VP Percent: 20.03 %
Brady Statistic AP VS Percent: 0 %
Brady Statistic AS VP Percent: 79.94 %
Brady Statistic AS VS Percent: 0.02 %
Brady Statistic RA Percent Paced: 19.97 %
Brady Statistic RV Percent Paced: 99.98 %
Date Time Interrogation Session: 20240104103922
Implantable Lead Connection Status: 753985
Implantable Lead Connection Status: 753985
Implantable Lead Connection Status: 753985
Implantable Lead Implant Date: 20130418
Implantable Lead Implant Date: 20130418
Implantable Lead Implant Date: 20150312
Implantable Lead Location: 753858
Implantable Lead Location: 753859
Implantable Lead Location: 753860
Implantable Pulse Generator Implant Date: 20231002
Lead Channel Impedance Value: 3344 Ohm
Lead Channel Impedance Value: 3344 Ohm
Lead Channel Impedance Value: 3344 Ohm
Lead Channel Impedance Value: 342 Ohm
Lead Channel Impedance Value: 437 Ohm
Lead Channel Impedance Value: 456 Ohm
Lead Channel Impedance Value: 494 Ohm
Lead Channel Impedance Value: 551 Ohm
Lead Channel Impedance Value: 608 Ohm
Lead Channel Pacing Threshold Amplitude: 0.875 V
Lead Channel Pacing Threshold Pulse Width: 0.4 ms
Lead Channel Sensing Intrinsic Amplitude: 2.375 mV
Lead Channel Sensing Intrinsic Amplitude: 2.375 mV
Lead Channel Setting Pacing Amplitude: 2 V
Lead Channel Setting Pacing Amplitude: 2 V
Lead Channel Setting Pacing Amplitude: 2.5 V
Lead Channel Setting Pacing Pulse Width: 0.4 ms
Lead Channel Setting Pacing Pulse Width: 0.8 ms
Lead Channel Setting Sensing Sensitivity: 5.6 mV
Zone Setting Status: 755011
Zone Setting Status: 755011

## 2022-10-30 ENCOUNTER — Encounter: Payer: Self-pay | Admitting: Internal Medicine

## 2022-10-30 ENCOUNTER — Ambulatory Visit: Payer: Medicare Other | Attending: Internal Medicine | Admitting: Internal Medicine

## 2022-10-30 VITALS — BP 148/68 | HR 68 | Ht 69.5 in | Wt 160.8 lb

## 2022-10-30 DIAGNOSIS — I442 Atrioventricular block, complete: Secondary | ICD-10-CM | POA: Diagnosis not present

## 2022-10-30 DIAGNOSIS — Z950 Presence of cardiac pacemaker: Secondary | ICD-10-CM

## 2022-10-30 DIAGNOSIS — I5042 Chronic combined systolic (congestive) and diastolic (congestive) heart failure: Secondary | ICD-10-CM

## 2022-10-30 LAB — CUP PACEART INCLINIC DEVICE CHECK
Battery Remaining Longevity: 103 mo
Battery Voltage: 3.13 V
Brady Statistic AP VP Percent: 19.84 %
Brady Statistic AP VS Percent: 0 %
Brady Statistic AS VP Percent: 80.14 %
Brady Statistic AS VS Percent: 0.02 %
Brady Statistic RA Percent Paced: 19.77 %
Brady Statistic RV Percent Paced: 99.98 %
Date Time Interrogation Session: 20240105171035
Implantable Lead Connection Status: 753985
Implantable Lead Connection Status: 753985
Implantable Lead Connection Status: 753985
Implantable Lead Implant Date: 20130418
Implantable Lead Implant Date: 20130418
Implantable Lead Implant Date: 20150312
Implantable Lead Location: 753858
Implantable Lead Location: 753859
Implantable Lead Location: 753860
Implantable Pulse Generator Implant Date: 20231002
Lead Channel Impedance Value: 3344 Ohm
Lead Channel Impedance Value: 3344 Ohm
Lead Channel Impedance Value: 3344 Ohm
Lead Channel Impedance Value: 380 Ohm
Lead Channel Impedance Value: 475 Ohm
Lead Channel Impedance Value: 513 Ohm
Lead Channel Impedance Value: 532 Ohm
Lead Channel Impedance Value: 608 Ohm
Lead Channel Impedance Value: 665 Ohm
Lead Channel Pacing Threshold Amplitude: 0.75 V
Lead Channel Pacing Threshold Pulse Width: 0.4 ms
Lead Channel Sensing Intrinsic Amplitude: 2.875 mV
Lead Channel Sensing Intrinsic Amplitude: 3.75 mV
Lead Channel Setting Pacing Amplitude: 2 V
Lead Channel Setting Pacing Amplitude: 2 V
Lead Channel Setting Pacing Amplitude: 2.5 V
Lead Channel Setting Pacing Pulse Width: 0.4 ms
Lead Channel Setting Pacing Pulse Width: 0.8 ms
Lead Channel Setting Sensing Sensitivity: 5.6 mV
Zone Setting Status: 755011
Zone Setting Status: 755011

## 2022-10-30 NOTE — Progress Notes (Signed)
HPI [name] returns today for ongoing evaluation and management of CAD, chronic systolic heart failure, COPD, and atrial arrhythmias. In the interim, the patient has not been sob. He denies syncope. No edema. Despite a well functioning biv PPM he has class 2B CHF symptoms. His meds have been adjusted by Dr. Loletha Grayer. He has not had syncope. No chest pain and no edema. Allergies  Allergen Reactions   Pollen Extract Other (See Comments)    "runny nose"     Current Outpatient Medications  Medication Sig Dispense Refill   aspirin EC 81 MG tablet Take 81 mg by mouth daily.     benazepril (LOTENSIN) 5 MG tablet Take 5 mg by mouth daily.     clopidogrel (PLAVIX) 75 MG tablet TAKE 1 TABLET BY MOUTH  DAILY 90 tablet 2   empagliflozin (JARDIANCE) 10 MG TABS tablet Take 1 tablet (10 mg total) by mouth daily before breakfast. 30 tablet 11   furosemide (LASIX) 20 MG tablet Take 2 tablets (40 mg total) by mouth daily as needed for fluid or edema (for shortness of  breath and swelling. Please take for weight greater than 166lb). MAY TAKE 40mg   DAILY AS NEEDED FOR SWELLING/SHORTNESS OF BREATH and WEIGHT OVER 166lbs 30 tablet 3   glipiZIDE (GLUCOTROL XL) 5 MG 24 hr tablet Take 5 mg by mouth in the morning and at bedtime.     isosorbide mononitrate (IMDUR) 30 MG 24 hr tablet TAKE 1 TABLET BY MOUTH  DAILY 30 tablet 1   metFORMIN (GLUCOPHAGE) 1000 MG tablet Take 1 tablet (1,000 mg total) by mouth 2 (two) times daily with a meal. HOLD for 2 days. Restart 03/10/2014.     metoprolol tartrate (LOPRESSOR) 100 MG tablet TAKE 1 TABLET BY MOUTH TWO  TIMES DAILY 180 tablet 2   nitroGLYCERIN (NITROSTAT) 0.4 MG SL tablet Place 1 tablet (0.4 mg total) under the tongue every 5 (five) minutes as needed for chest pain. 25 tablet 6   pantoprazole (PROTONIX) 40 MG tablet Take 40 mg by mouth daily as needed (acid reflux).      rosuvastatin (CRESTOR) 10 MG tablet TAKE 1 TABLET BY MOUTH  DAILY 90 tablet 3   No current  facility-administered medications for this visit.     Past Medical History:  Diagnosis Date   Atrial tachycardia    Cardiomyopathy ([place])    mixed ischemic and nonischemic   CHB (complete heart block) ([place]) 02/11/12   Medtronic permanent pacemaker   CHF (congestive heart failure) ([facility])    COPD (chronic obstructive pulmonary disease) ([facility])    Coronary artery disease    DOE (dyspnea on exertion)    NYHA class 2-3   DVT (deep venous thrombosis) ([place]) 12/2005   RLE "had to have OR"   Dyslipidemia    GERD (gastroesophageal reflux disease)    Hypertension    LBBB (left bundle branch block)    Myocardial infarction ([place])    "I've had 6" (01/03/2014)   Psoriasis    Sleep apnea    c pap at home, does not use   Type II diabetes mellitus ([place])     ROS:   All systems reviewed and negative except as noted in the HPI.   Past Surgical History:  Procedure Laterality Date   BI-VENTRICULAR PACEMAKER REVISION N/A 03/06/2014   Procedure: BI-VENTRICULAR PACEMAKER REVISION (CRT-R);  Surgeon: Evans Lance, MD;  Location: [facility] CATH LAB;  Service: Cardiovascular;  Laterality: N/A;   BIV PACEMAKER  GENERATOR CHANGEOUT N/A 07/27/2022   Procedure: BIV PACEMAKER GENERATOR CHANGEOUT;  Surgeon: Evans Lance, MD;  Location: [facility] CV LAB;  Service: Cardiovascular;  Laterality: N/A;   CARDIAC CATHETERIZATION  02/10/12   left CX: dominant w/40% prox. AV groove stenosis.The first & second marginal branches had stents visualized near the ostium of the vessel bu were functionally occluded. The remainder of the dominant CX had minor irregularities.  RCA: nondominant free of sign. disease. SVG to OM1 & OM2 sequentially was widely patent.   CATARACT EXTRACTION Left    CORONARY ANGIOPLASTY WITH STENT PLACEMENT     "I had 9 stents before the [facility] in 2007" (01/03/2014)   CORONARY ARTERY BYPASS GRAFT  2007   x 3   ELECTROPHYSIOLOGY STUDY  01/03/2014   EPS by Dr Lovena Le with no inducible ventricular arrhythmias    ELECTROPHYSIOLOGY STUDY N/A 01/03/2014   Procedure: ELECTROPHYSIOLOGY STUDY;  Surgeon: Evans Lance, MD;  Location: [facility] C[facility] LAB;  Service: Cardiovascular;  Laterality: N/A;   LEAD REVISION  03-06-2014   LV lead revision by Dr Lovena Le   LEFT HEART CATHETERIZATION WITH CORONARY/GRAFT ANGIOGRAM  02/10/2012   Procedure: LEFT HEART CATHETERIZATION WITH Beatrix Fetters;  Surgeon: Lorretta Harp, MD;  Location: [facility] CATH LAB;  Service: Cardiovascular;;   NM MYOCAR PERF WALL MOTION  10/02/2009   no significant ischemia   PACEMAKER INSERTION  2013; 01/03/2014   MDT dual chamber pacemaker implanted 2013 for complete heart block; upgrade to CRTP (MDT) by Dr Lovena Le 12/2013 due to cardiomyopathy and heart failure   PERMANENT PACEMAKER INSERTION N/A 02/11/2012   Procedure: PERMANENT PACEMAKER INSERTION;  Surgeon: Sanda Klein, MD;  Location: [facility] CATH LAB;  Service: Cardiovascular;  Laterality: N/A;   REFRACTIVE SURGERY Right    TEMPORARY PACEMAKER INSERTION Bilateral 02/10/2012   Procedure: TEMPORARY PACEMAKER INSERTION;  Surgeon: Lorretta Harp, MD;  Location: [facility] CATH LAB;  Service: Cardiovascular;  Laterality: Bilateral;   US ECHOCARDIOGRAPHY  2/59/56   LV systolic fx mod to severely reduced, EF 35-40%, AOV mildly sclerotic     Family History  Problem Relation Age of Onset   Cancer Father      Social History   Socioeconomic History   Marital status: Married    Spouse name: Not on file   Number of children: Not on file   Years of education: Not on file   Highest education level: Not on file  Occupational History   Not on file  Tobacco Use   Smoking status: Former    Packs/day: 15.00    Years: 36.00    Total pack years: 540.00    Types: Cigars, Cigarettes    Quit date: 05/25/2005    Years since quitting: 17.4   Smokeless tobacco: Never   Tobacco comments:    15 cigars/daily when smoking  Substance and Sexual Activity   Alcohol use: Yes    Alcohol/week: 0.0 standard drinks of  alcohol    Comment: "quit alcohol in 1985 or 1986"   Drug use: No   Sexual activity: Not Currently  Other Topics Concern   Not on file  Social History Narrative   Not on file   Social Determinants of Health   Financial Resource Strain: Not on file  Food Insecurity: Not on file  Transportation Needs: Not on file  Physical Activity: Not on file  Stress: Not on file  Social Connections: Not on file  Intimate Partner Violence: Not on file     BP (!) 148/68  Pulse 68   Ht 5' 9.5" (1.765 m)   Wt 160 lb 12.8 oz (72.9 kg)   SpO2 97%   BMI 23.41 kg/m   Physical Exam:  Well appearing NAD HEENT: Unremarkable Neck:  No JVD, no thyromegally Lymphatics:  No adenopathy Back:  No CVA tenderness Lungs:  Clear HEART:  Regular rate rhythm, no murmurs, no rubs, no clicks Abd:  soft, positive bowel sounds, no organomegally, no rebound, no guarding Ext:  2 plus pulses, no edema, no cyanosis, no clubbing Skin:  No rashes no nodules Neuro:  CN II through XII intact, motor grossly intact  EKG - nsr with biv pacing  DEVICE  Normal device function.  See PaceArt for details.   Assess/Plan:  1. Chronic systolic heart failure - he had a normalization of his LV function. No change in meds. 2. BIV PPM - his medtronic device has been reprogrammed after the LV ring has been found to not be working correctly. He is programmed tip to can with no diaphragmatic stim.  3. CHB - he is asymptomatic,s /p PPM insertion.   [name].D.

## 2022-10-30 NOTE — Patient Instructions (Signed)
Medication Instructions:  Your physician recommends that you continue on your current medications as directed. Please refer to the Current Medication list given to you today.  *If you need a refill on your cardiac medications before your next appointment, please call your pharmacy*  Lab Work: None ordered.  If you have labs (blood work) drawn today and your tests are completely normal, you will receive your results only by: [place] (if you have MyChart) OR A paper copy in the mail If you have any lab test that is abnormal or we need to change your treatment, we will call you to review the results.  Testing/Procedures: None ordered.  Follow-Up: At [facility], you and your health needs are our priority.  As part of our continuing mission to provide you with exceptional heart care, we have created designated Provider Care Teams.  These Care Teams include your primary Cardiologist (physician) and Advanced Practice Providers (APPs -  Physician Assistants and Nurse Practitioners) who all work together to provide you with the care you need, when you need it.  We recommend signing up for the patient portal called "MyChart".  Sign up information is provided on this After Visit Summary.  MyChart is used to connect with patients for Virtual Visits (Telemedicine).  Patients are able to view lab/test results, encounter notes, upcoming appointments, etc.  Non-urgent messages can be sent to your provider as well.   To learn more about what you can do with MyChart, go to [email or web address].    Your next appointment:   1 year(s)  The format for your next appointment:   In Person  Provider:   [name], MD{or one of the following Advanced Practice Providers on your designated Care Team:   Tommye Standard, [place] Legrand Como "Jonni Sanger" Chalmers Cater, [place]  Remote monitoring is used to monitor your Pacemaker from home. This monitoring reduces the number of office visits required to check your device to  one time per year. It allows [place] to keep an eye on the functioning of your device to ensure it is working properly. You are scheduled for a device check from home on 01/26/23. You may send your transmission at any time that day. If you have a wireless device, the transmission will be sent automatically. After your physician reviews your transmission, you will receive a postcard with your next transmission date.  Important Information About [place]

## 2022-11-18 ENCOUNTER — Ambulatory Visit: Payer: Medicare Other | Attending: Cardiovascular Disease | Admitting: Cardiovascular Disease

## 2022-11-18 ENCOUNTER — Encounter: Payer: Self-pay | Admitting: Cardiovascular Disease

## 2022-11-18 VITALS — BP 116/72 | HR 75 | Ht 69.5 in | Wt 158.6 lb

## 2022-11-18 DIAGNOSIS — I5042 Chronic combined systolic (congestive) and diastolic (congestive) heart failure: Secondary | ICD-10-CM

## 2022-11-18 DIAGNOSIS — I25810 Atherosclerosis of coronary artery bypass graft(s) without angina pectoris: Secondary | ICD-10-CM | POA: Diagnosis not present

## 2022-11-18 DIAGNOSIS — E782 Mixed hyperlipidemia: Secondary | ICD-10-CM | POA: Diagnosis not present

## 2022-11-18 DIAGNOSIS — I442 Atrioventricular block, complete: Secondary | ICD-10-CM

## 2022-11-18 DIAGNOSIS — I10 Essential (primary) hypertension: Secondary | ICD-10-CM

## 2022-11-18 DIAGNOSIS — Z950 Presence of cardiac pacemaker: Secondary | ICD-10-CM

## 2022-11-18 DIAGNOSIS — I4719 Other supraventricular tachycardia: Secondary | ICD-10-CM

## 2022-11-18 DIAGNOSIS — J449 Chronic obstructive pulmonary disease, unspecified: Secondary | ICD-10-CM

## 2022-11-18 NOTE — Patient Instructions (Addendum)
Medication Instructions:  Your physician recommends that you continue on your current medications as directed. Please refer to the Current Medication list given to you today.  *If you need a refill on your cardiac medications before your next appointment, please call your pharmacy*  Follow-Up: At [facility], you and your health needs are our priority.  As part of our continuing mission to provide you with exceptional heart care, we have created designated Provider Care Teams.  These Care Teams include your primary Cardiologist (physician) and Advanced Practice Providers (APPs -  Physician Assistants and Nurse Practitioners) who all work together to provide you with the care you need, when you need it.  We recommend signing up for the patient portal called "MyChart".  Sign up information is provided on this After Visit Summary.  MyChart is used to connect with patients for Virtual Visits (Telemedicine).  Patients are able to view lab/test results, encounter notes, upcoming appointments, etc.  Non-urgent messages can be sent to your provider as well.   To learn more about what you can do with MyChart, go to [email or web address].    Your next appointment:   6 month(s)  Provider:   Mihai Croitoru, MD       

## 2022-11-18 NOTE — Progress Notes (Signed)
Patient ID: [name], male   DOB: Mar 22, 1946, 77 y.o.   MRN: [id number]    Cardiology Office Note    Date:  11/18/2022   ID:  [name], DOB [birth year], MRN [id number]  PCP:  Renford Dills, MD  Cardiologist:  [name], MD:  Thurmon Fair, MD   No chief complaint on file.   History of Present Illness:  [name] is a 77 y.o. male with ischemic cardiomyopathy, chronic systolic and diastolic heart failure, complete heart block, frequent sustained paroxysmal atrial tachycardia and a biventricular pacemaker (new generator August 04, 2022 Medtronic Percepta CRT-P, original leads 2015). He also has advanced chronic obstructive lung disease related to long-standing previous smoking, type 2 diabetes mellitus on oral antidiabetics, small AAA (3 cm by [place] 09/2017), moderate bilateral PAD of lower extremities.  Although he feels better after starting treatment with Jardiance, he continues to have shortness of breath with activity.  He uses a firewood stone and gets very dyspneic carrying the wood and a wheelbarrow.  Occasionally gets short of breath getting dressed, but he does not have dyspnea at rest, orthopnea or PND.  He does not have any lower extremity edema.  He has not taken any furosemide since he started [place].  He has not had chest pain, palpitations or syncope but does have orthostatic dizziness at times.  He recently had a follow-up EP visit with Dr. Ladona Ridgel.  His biventricular device was working normally.  He has complete heart block and is pacemaker dependent therefore has 100% biventricular paced rhythm.  His OptiVol was out of range in late December but has returned to normal by the time of his visit with Dr. Ladona Ridgel.  In addition to ventricular pacing he has roughly 20% atrial pacing.  Heart rate histogram is distribution is normal.  He has not had brief episodes of atrial mode switch but no atrial fibrillation.  He has lower extremity neuropathy, but does not have symptoms  of carpal tunnel syndrome.  His echocardiogram, which was performed on 09/22/2021, roughly 5 days after he increased his dose of diuretic actually shows improved LVEF at 55 to 60% and borderline findings of high left heart filling pressures (E/e'=13).  Mitral annulus medial e' and lateral e' are both approximately 5 cm/s.  Right ventricular s' is 8.5 cm/s.  He last underwent evaluation for his small abdominal aortic aneurysm in January 2022 when it measured 3.1 cm, essentially unchanged from previous evaluation.  He also has stable moderate bilateral iliac stenoses that appear to be asymptomatic.  We have decided to perform ultrasound follow-up every 2 years since the findings have been so stable.  He has good metabolic risk factor control.  On statin therapy his recent LDL cholesterol was 66 and HDL 40.  Most recent hemoglobin A1c was borderline at 7.9% (on combination glipizide, metformin, Actos).  His most recent creatinine is normal at 0.74.  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  Enjoys his grandson.    He has a long-standing history of coronary disease and underwent bypass surgery in 2007. He had left bundle branch block for years and in 2013 received a dual-chamber permanent pacemaker for complete heart block Just before getting his pacemaker he underwent cardiac catheterization which showed a patent sequential saphenous vein graft bypass to the first and second oblique marginal arteries (both of these vessels had previously been stented and  had severe in-stent restenosis).   He has no underlying escape rhythm and is 100% paced. He requires relatively high doses of beta blockers for control of recurrent paroxysmal atrial tachycardia which was quite symptomatic. Left ventricular ejection fraction had been 45-50% prior to pacemaker implantation. Following implantation of his pacemaker his echocardiogram showed  moderately depressed left ventricular systolic function with an ejection fraction of 35-40%. He improved following biventricular pacemaker upgrade, but then had lead dislodgment and deteriorated again. After repositioning of his left ventricular lead, left ventricular ejection fraction increased to 50-55 %- by his most recent echocardiogram in December 2015.   Attempts at increased ACE inhibitor in the past led to severe symptomatic hypotension.  May 2017 had lower extremity arterial Dopplers showing  >50% right external and left common iliac artery stenosis. 30-49% right SFA disease, without focal stenosis. 50-74% left distal SFA stenosis. Occluded bilateral anterior tibial artery disease, two vessel run-off, bilaterally. Bilateral ABI 1.1-1.26 September 2017 AAA 3.0 cm. September 2020 AAA 3.0 cm January 2022 AAA 3.1 cm Right ABI 1.21, left ABI 0.80   Past Medical History:  Diagnosis Date   Atrial tachycardia    Cardiomyopathy ([place])    mixed ischemic and nonischemic   CHB (complete heart block) ([place]) 02/11/12   Medtronic permanent pacemaker   CHF (congestive heart failure) (HCC)    COPD (chronic obstructive pulmonary disease) (HCC)    Coronary artery disease    DOE (dyspnea on exertion)    NYHA class 2-3   DVT (deep venous thrombosis) ([place]) 12/2005   RLE "had to have OR"   Dyslipidemia    GERD (gastroesophageal reflux disease)    Hypertension    LBBB (left bundle branch block)    Myocardial infarction ([place])    "I've had 6" (01/03/2014)   Psoriasis    Sleep apnea    c pap at home, does not use   Type II diabetes mellitus ([place])     Past Surgical History:  Procedure Laterality Date   BI-VENTRICULAR PACEMAKER REVISION N/A 03/06/2014   Procedure: BI-VENTRICULAR PACEMAKER REVISION (CRT-R);  Surgeon: Evans Lance, MD;  Location: [facility] CATH LAB;  Service: Cardiovascular;  Laterality: N/A;   BIV PACEMAKER GENERATOR CHANGEOUT N/A 07/27/2022   Procedure: BIV PACEMAKER GENERATOR CHANGEOUT;   Surgeon: Evans Lance, MD;  Location: [place] CV LAB;  Service: Cardiovascular;  Laterality: N/A;   CARDIAC CATHETERIZATION  02/10/12   left CX: dominant w/40% prox. AV groove stenosis.The first & second marginal branches had stents visualized near the ostium of the vessel bu were functionally occluded. The remainder of the dominant CX had minor irregularities.  RCA: nondominant free of sign. disease. SVG to OM1 & OM2 sequentially was widely patent.   CATARACT EXTRACTION Left    CORONARY ANGIOPLASTY WITH STENT PLACEMENT     "I had 9 stents before the [facility] in 2007" (01/03/2014)   CORONARY ARTERY BYPASS GRAFT  2007   x 3   ELECTROPHYSIOLOGY STUDY  01/03/2014   EPS by Dr Lovena Le with no inducible ventricular arrhythmias   ELECTROPHYSIOLOGY STUDY N/A 01/03/2014   Procedure: ELECTROPHYSIOLOGY STUDY;  Surgeon: Evans Lance, MD;  Location: [facility] CATH LAB;  Service: Cardiovascular;  Laterality: N/A;   LEAD REVISION  03-06-2014   LV lead revision by Dr Lovena Le   LEFT HEART CATHETERIZATION WITH CORONARY/GRAFT ANGIOGRAM  02/10/2012   Procedure: LEFT HEART CATHETERIZATION WITH Beatrix Fetters;  Surgeon: Lorretta Harp, MD;  Location: [facility] CATH LAB;  Service: Cardiovascular;;   NM  MYOCAR PERF WALL MOTION  10/02/2009   no significant ischemia   PACEMAKER INSERTION  2013; 01/03/2014   MDT dual chamber pacemaker implanted 2013 for complete heart block; upgrade to CRTP (MDT) by Dr Lovena Le 12/2013 due to cardiomyopathy and heart failure   PERMANENT PACEMAKER INSERTION N/A 02/11/2012   Procedure: PERMANENT PACEMAKER INSERTION;  Surgeon: Sanda Klein, MD;  Location: [place] CATH LAB;  Service: Cardiovascular;  Laterality: N/A;   REFRACTIVE SURGERY Right    TEMPORARY PACEMAKER INSERTION Bilateral 02/10/2012   Procedure: TEMPORARY PACEMAKER INSERTION;  Surgeon: Lorretta Harp, MD;  Location: [facility] CATH LAB;  Service: Cardiovascular;  Laterality: Bilateral;   US ECHOCARDIOGRAPHY  0/16/55   LV systolic fx mod to  severely reduced, EF 35-40%, AOV mildly sclerotic    Current Medications: Outpatient Medications Prior to Visit  Medication Sig Dispense Refill   aspirin EC 81 MG tablet Take 81 mg by mouth daily.     benazepril (LOTENSIN) 5 MG tablet Take 5 mg by mouth daily.     clopidogrel (PLAVIX) 75 MG tablet TAKE 1 TABLET BY MOUTH  DAILY 90 tablet 2   empagliflozin (JARDIANCE) 10 MG TABS tablet Take 1 tablet (10 mg total) by mouth daily before breakfast. 30 tablet 11   furosemide (LASIX) 20 MG tablet Take 2 tablets (40 mg total) by mouth daily as needed for fluid or edema (for shortness of  breath and swelling. Please take for weight greater than 166lb). MAY TAKE 40mg   DAILY AS NEEDED FOR SWELLING/SHORTNESS OF BREATH and WEIGHT OVER 166lbs 30 tablet 3   glipiZIDE (GLUCOTROL XL) 5 MG 24 hr tablet Take 5 mg by mouth in the morning and at bedtime.     isosorbide mononitrate (IMDUR) 30 MG 24 hr tablet TAKE 1 TABLET BY MOUTH  DAILY 30 tablet 1   metFORMIN (GLUCOPHAGE) 1000 MG tablet Take 1 tablet (1,000 mg total) by mouth 2 (two) times daily with a meal. HOLD for 2 days. Restart 03/10/2014.     metoprolol tartrate (LOPRESSOR) 100 MG tablet TAKE 1 TABLET BY MOUTH TWO  TIMES DAILY 180 tablet 2   nitroGLYCERIN (NITROSTAT) 0.4 MG SL tablet Place 1 tablet (0.4 mg total) under the tongue every 5 (five) minutes as needed for chest pain. 25 tablet 6   pantoprazole (PROTONIX) 40 MG tablet Take 40 mg by mouth daily as needed (acid reflux).      rosuvastatin (CRESTOR) 10 MG tablet TAKE 1 TABLET BY MOUTH  DAILY 90 tablet 3   No facility-administered medications prior to visit.     Allergies:   Pollen extract   Social History   Socioeconomic History   Marital status: Married    Spouse name: Not on file   Number of children: Not on file   Years of education: Not on file   Highest education level: Not on file  Occupational History   Not on file  Tobacco Use   Smoking status: Former    Packs/day: 15.00    Years:  36.00    Total pack years: 540.00    Types: Cigars, Cigarettes    Quit date: 05/25/2005    Years since quitting: 17.4   Smokeless tobacco: Never   Tobacco comments:    15 cigars/daily when smoking  Substance and Sexual Activity   Alcohol use: Yes    Alcohol/week: 0.0 standard drinks of alcohol    Comment: "quit alcohol in 1985 or 1986"   Drug use: No   Sexual activity: Not Currently  Other  Topics Concern   Not on file  Social History Narrative   Not on file   Social Determinants of Health   Financial Resource Strain: Not on file  Food Insecurity: Not on file  Transportation Needs: Not on file  Physical Activity: Not on file  Stress: Not on file  Social Connections: Not on file     Family History:  The patient's family history includes Cancer in his father.   ROS:   Please see the history of present illness.    ROS All other systems are reviewed and are negative.  PHYSICAL EXAM:   VS:  BP 116/72   Pulse 75   Ht 5' 9.5" (1.765 m)   Wt 158 lb 9.6 oz (71.9 kg)   SpO2 97%   BMI 23.09 kg/m      General: Alert, oriented x3, no distress, well-healed pacemaker site   Head: no evidence of trauma, PERRL, EOMI, no exophtalmos or lid lag, no myxedema, no xanthelasma; normal ears, nose and oropharynx Neck: normal jugular venous pulsations and no hepatojugular reflux; brisk carotid pulses without delay and no carotid bruits Chest: clear to auscultation, no signs of consolidation by percussion or palpation, normal fremitus, symmetrical and full respiratory excursions Cardiovascular: normal position and quality of the apical impulse, regular rhythm, normal first and second heart sounds, no murmurs, rubs or gallops Abdomen: no tenderness or distention, no masses by palpation, no abnormal pulsatility or arterial bruits, normal bowel sounds, no hepatosplenomegaly Extremities: no clubbing, cyanosis or edema; 2+ radial, ulnar and brachial pulses bilaterally; 2+ right femoral, posterior  tibial and dorsalis pedis pulses; 2+ left femoral, posterior tibial and dorsalis pedis pulses; no subclavian or femoral bruits Neurological: grossly nonfocal Psych: Normal mood and affect     Wt Readings from Last 3 Encounters:  11/18/22 158 lb 9.6 oz (71.9 kg)  10/30/22 160 lb 12.8 oz (72.9 kg)  10/01/22 165 lb 9.6 oz (75.1 kg)      Studies/Labs Reviewed:   EKG:  EKG is not ordered today.   Most recent tracing from 10/30/2022 is personally reviewed and shows atrial sensed (sinus), BiV paced rhythm with a very tall R wave in lead V1, but with a broad QRS at 166 ms. Recent Labs:  09/27/2020 Hemoglobin 14.8, creatinine 0.79, potassium 4.9, normal liver function tests, hemoglobin A1c 7.5% Total cholesterol 141, HDL 42, LDL 68, triglycerides 181  05/12/2022 Hemoglobin A1c 7.9% Cholesterol 136, HDL 40, LDL 66, triglycerides 173 TSH 1.46  07/17/2019. Hemoglobin 13.3, creatinine 0.74, potassium 4.5   ASSESSMENT:    1. Chronic combined systolic and diastolic CHF, NYHA class 3 (HCC)   2. CHB (complete heart block) (HCC)   3. Essential hypertension   4. PAT (paroxysmal atrial tachycardia)   5. Coronary artery disease involving coronary bypass graft of native heart without angina pectoris   6. Chronic obstructive pulmonary disease, unspecified COPD type (HCC)   7. Biventricular cardiac pacemaker in situ   8. Mixed hyperlipidemia       PLAN:  In order of problems listed above:  CHF: On Jardiance, ACEi, no longer on scheduled loop diuretics.  Weight now under our estimated dry weight around 160 pounds.  He can take the loop diuretic as needed if his weight is 166 pounds.  He has recovered LVEF following CRT-D.  He did not tolerate higher doses of ACE inhibitor due to hypotension and is unlikely to tolerate Entresto.  Avoid Actos and NSAIDs. Consider superimposed cardiac amyloidosis (neuropathy, orthostatic dizziness, diminished e'  and s', not quite "5-5-5 sign"). Current PYP  shortage, will get a scintigram when available. CHB: He does not have escape rhythm.  Pacemaker dependent. HTN: good control PAT: He has not had overt atrial fibrillation during many years of monitoring with his pacemaker.  On aspirin and clopidogrel for PAD. CAD: asymptomatic. He has not had angina pectoris in very many years. Known inferior scar by previous nuclear stress testing, not performed since 2015.  Has not required cardiac catheterization since 2013. COPD: On bronchodilators and today he has no wheezing.  He has not smoked since his first episode of myocardial infarction. CRT-P: Normalization of LVEF with biventricular pacing. HLP: All lipid parameters are in desirable range on statin therapy.  Continue DM, complicated by PAD: Stop Actos.  Start [place].  Continue glipizide for now. PAD: He does not have intermittent claudication jesting good collateral formation.  Slightly reduced ABI on the left.  Extensive infrapopliteal disease as well as iliac artery stenoses.   AAA: Infrarenal fusiform dilation of the distal aorta last measured 3.1 cm, essentially unchanged over the last 5 years.  Last evaluation was in January 2022.  Should reevaluate next year.  We reviewed the potential side effects of SGLT2 inhibitors including increased risk of urinary tract infections, groin yeast infections, low risk of severe Fournier's gangrene.  He should stop the medication immediately if he develops any sign of peritoneal tenderness, redness or other symptoms of infection.    Medication Adjustments/Labs and Tests Ordered: Current medicines are reviewed at length with the patient today.  Concerns regarding medicines are outlined above.  Medication changes, Labs and Tests ordered today are listed in the Patient Instructions below. Patient Instructions  Medication Instructions:  Your physician recommends that you continue on your current medications as directed. Please refer to the Current Medication  list given to you today.  *If you need a refill on your cardiac medications before your next appointment, please call your pharmacy*  Follow-Up: At [facility], you and your health needs are our priority.  As part of our continuing mission to provide you with exceptional heart care, we have created designated Provider Care Teams.  These Care Teams include your primary Cardiologist (physician) and Advanced Practice Providers (APPs -  Physician Assistants and Nurse Practitioners) who all work together to provide you with the care you need, when you need it.  We recommend signing up for the patient portal called "MyChart".  Sign up information is provided on this After Visit Summary.  MyChart is used to connect with patients for Virtual Visits (Telemedicine).  Patients are able to view lab/test results, encounter notes, upcoming appointments, etc.  Non-urgent messages can be sent to your provider as well.   To learn more about what you can do with MyChart, go to [email or web address].    Your next appointment:   6 month(s)  Provider:   Thurmon Fair, MD         Signed, Thurmon Fair, MD  11/18/2022 10:19 AM    [facility] [address] [place], [place], [place]  [address] Phone: [phone]; Fax: [phone]

## 2022-11-19 ENCOUNTER — Telehealth: Payer: Self-pay | Admitting: Cardiovascular Disease

## 2022-11-19 NOTE — Telephone Encounter (Signed)
Patient called he filled out paperwork for patient assistance for [facility], he was calling to check on the status of it.

## 2022-11-19 NOTE — Telephone Encounter (Signed)
Spoke with pt, aware the nurse that takes care of dr croitoru's paperwork os not in the office today but will forward for her to review and call him back.

## 2022-11-20 NOTE — Telephone Encounter (Signed)
Spoke to patient, he will bring 1040 to the office on Monday.

## 2022-11-20 NOTE — Telephone Encounter (Signed)
Application faxed 40/9 Called [employer] to follow up-need 1040 or proof of income   Left message for patient to call back

## 2022-11-20 NOTE — Telephone Encounter (Signed)
Pt returning call,  Pt states his son brought the proof of income to our office and someone made a copy of it and his son kept the original.

## 2022-11-23 NOTE — Telephone Encounter (Signed)
Proof of income received and faxed to [facility].  Patient made aware.

## 2022-11-25 DIAGNOSIS — I442 Atrioventricular block, complete: Secondary | ICD-10-CM | POA: Diagnosis not present

## 2022-11-25 DIAGNOSIS — Z950 Presence of cardiac pacemaker: Secondary | ICD-10-CM | POA: Diagnosis not present

## 2022-11-25 DIAGNOSIS — E7800 Pure hypercholesterolemia, unspecified: Secondary | ICD-10-CM | POA: Diagnosis not present

## 2022-11-25 DIAGNOSIS — J449 Chronic obstructive pulmonary disease, unspecified: Secondary | ICD-10-CM | POA: Diagnosis not present

## 2022-11-25 DIAGNOSIS — E1142 Type 2 diabetes mellitus with diabetic polyneuropathy: Secondary | ICD-10-CM | POA: Diagnosis not present

## 2022-11-25 DIAGNOSIS — I5042 Chronic combined systolic (congestive) and diastolic (congestive) heart failure: Secondary | ICD-10-CM | POA: Diagnosis not present

## 2022-11-25 DIAGNOSIS — E1151 Type 2 diabetes mellitus with diabetic peripheral angiopathy without gangrene: Secondary | ICD-10-CM | POA: Diagnosis not present

## 2022-11-25 DIAGNOSIS — E1165 Type 2 diabetes mellitus with hyperglycemia: Secondary | ICD-10-CM | POA: Diagnosis not present

## 2022-11-26 NOTE — Progress Notes (Signed)
Remote pacemaker transmission.   

## 2022-12-09 ENCOUNTER — Telehealth: Payer: Self-pay | Admitting: Cardiovascular Disease

## 2022-12-09 NOTE — Telephone Encounter (Signed)
Called [employer] at [phone].  Patient approved for Jardiance assistance until 10/26/23. Per rep, med was to have been shipped but returned to sender as address undeliverable.    Called patient with this update. Advised he call [employer] if nothing received Friday or Monday. Confirmed the address is his chart is up to date.

## 2022-12-09 NOTE — Telephone Encounter (Signed)
Pt called in for an update on patient assistance application for Jardiance turned in about two weeks ago

## 2023-02-04 ENCOUNTER — Ambulatory Visit (INDEPENDENT_AMBULATORY_CARE_PROVIDER_SITE_OTHER): Payer: Medicare Other

## 2023-02-04 DIAGNOSIS — I442 Atrioventricular block, complete: Secondary | ICD-10-CM | POA: Diagnosis not present

## 2023-02-04 LAB — CUP PACEART REMOTE DEVICE CHECK
Battery Remaining Longevity: 96 mo
Battery Voltage: 3.04 V
Brady Statistic AP VP Percent: 14.95 %
Brady Statistic AP VS Percent: 0 %
Brady Statistic AS VP Percent: 85.02 %
Brady Statistic AS VS Percent: 0.03 %
Brady Statistic RA Percent Paced: 14.91 %
Brady Statistic RV Percent Paced: 99.97 %
Date Time Interrogation Session: 20240411061935
Implantable Lead Connection Status: 753985
Implantable Lead Connection Status: 753985
Implantable Lead Connection Status: 753985
Implantable Lead Implant Date: 20130418
Implantable Lead Implant Date: 20130418
Implantable Lead Implant Date: 20150312
Implantable Lead Location: 753858
Implantable Lead Location: 753859
Implantable Lead Location: 753860
Implantable Pulse Generator Implant Date: 20231002
Lead Channel Impedance Value: 323 Ohm
Lead Channel Impedance Value: 3344 Ohm
Lead Channel Impedance Value: 3344 Ohm
Lead Channel Impedance Value: 3344 Ohm
Lead Channel Impedance Value: 418 Ohm
Lead Channel Impedance Value: 437 Ohm
Lead Channel Impedance Value: 475 Ohm
Lead Channel Impedance Value: 532 Ohm
Lead Channel Impedance Value: 608 Ohm
Lead Channel Pacing Threshold Amplitude: 0.75 V
Lead Channel Pacing Threshold Pulse Width: 0.4 ms
Lead Channel Sensing Intrinsic Amplitude: 3.25 mV
Lead Channel Sensing Intrinsic Amplitude: 3.25 mV
Lead Channel Setting Pacing Amplitude: 2 V
Lead Channel Setting Pacing Amplitude: 2 V
Lead Channel Setting Pacing Amplitude: 2.5 V
Lead Channel Setting Pacing Pulse Width: 0.4 ms
Lead Channel Setting Pacing Pulse Width: 0.8 ms
Lead Channel Setting Sensing Sensitivity: 5.6 mV
Zone Setting Status: 755011
Zone Setting Status: 755011

## 2023-03-11 NOTE — Progress Notes (Signed)
Remote pacemaker transmission.   

## 2023-05-06 ENCOUNTER — Ambulatory Visit (INDEPENDENT_AMBULATORY_CARE_PROVIDER_SITE_OTHER): Payer: Medicare Other

## 2023-05-06 DIAGNOSIS — I442 Atrioventricular block, complete: Secondary | ICD-10-CM | POA: Diagnosis not present

## 2023-05-06 LAB — CUP PACEART REMOTE DEVICE CHECK
Battery Remaining Longevity: 94 mo
Battery Voltage: 3.01 V
Brady Statistic AP VP Percent: 9.38 %
Brady Statistic AP VS Percent: 0 %
Brady Statistic AS VP Percent: 90.57 %
Brady Statistic AS VS Percent: 0.05 %
Brady Statistic RA Percent Paced: 9.34 %
Brady Statistic RV Percent Paced: 99.95 %
Date Time Interrogation Session: 20240711075508
Implantable Lead Connection Status: 753985
Implantable Lead Connection Status: 753985
Implantable Lead Connection Status: 753985
Implantable Lead Implant Date: 20130418
Implantable Lead Implant Date: 20130418
Implantable Lead Implant Date: 20150312
Implantable Lead Location: 753858
Implantable Lead Location: 753859
Implantable Lead Location: 753860
Implantable Pulse Generator Implant Date: 20231002
Lead Channel Impedance Value: 323 Ohm
Lead Channel Impedance Value: 3344 Ohm
Lead Channel Impedance Value: 3344 Ohm
Lead Channel Impedance Value: 3344 Ohm
Lead Channel Impedance Value: 437 Ohm
Lead Channel Impedance Value: 437 Ohm
Lead Channel Impedance Value: 494 Ohm
Lead Channel Impedance Value: 532 Ohm
Lead Channel Impedance Value: 589 Ohm
Lead Channel Pacing Threshold Amplitude: 0.75 V
Lead Channel Pacing Threshold Pulse Width: 0.4 ms
Lead Channel Sensing Intrinsic Amplitude: 24.625 mV
Lead Channel Sensing Intrinsic Amplitude: 24.625 mV
Lead Channel Sensing Intrinsic Amplitude: 3.125 mV
Lead Channel Sensing Intrinsic Amplitude: 3.125 mV
Lead Channel Setting Pacing Amplitude: 2 V
Lead Channel Setting Pacing Amplitude: 2 V
Lead Channel Setting Pacing Amplitude: 2.5 V
Lead Channel Setting Pacing Pulse Width: 0.4 ms
Lead Channel Setting Pacing Pulse Width: 0.8 ms
Lead Channel Setting Sensing Sensitivity: 5.6 mV
Zone Setting Status: 755011
Zone Setting Status: 755011

## 2023-05-25 NOTE — Progress Notes (Signed)
Remote pacemaker transmission.   

## 2023-07-30 ENCOUNTER — Telehealth: Payer: Self-pay | Admitting: Cardiovascular Disease

## 2023-07-30 NOTE — Telephone Encounter (Signed)
New Message:      Patient says he needs a copy of his approval letter for Jardiance sent to the new company, that took over please. He said they said to send a copy of the letter to  [email or web address]

## 2023-08-03 NOTE — Telephone Encounter (Signed)
Called the patient and told him to call [employer] at [phone]. Informed him that they were the ones who approved him for pt assistance, so they should have a copy of the approval letter.   Asked him to take notes on what they tell him and to call us back if needed. He verbalized understanding.

## 2023-08-03 NOTE — Telephone Encounter (Signed)
I don't see that we have a copy of his approval letter. I am also not sure what he's referring to about a new company taking over. Prior notes earlier this year mention [employer] [phone], recommend he contact them.

## 2023-08-05 ENCOUNTER — Ambulatory Visit (INDEPENDENT_AMBULATORY_CARE_PROVIDER_SITE_OTHER): Payer: Medicare Other

## 2023-08-05 DIAGNOSIS — I442 Atrioventricular block, complete: Secondary | ICD-10-CM

## 2023-08-05 LAB — CUP PACEART REMOTE DEVICE CHECK
Battery Remaining Longevity: 90 mo
Battery Voltage: 3 V
Brady Statistic AP VP Percent: 11.14 %
Brady Statistic AP VS Percent: 0 %
Brady Statistic AS VP Percent: 88.84 %
Brady Statistic AS VS Percent: 0.02 %
Brady Statistic RA Percent Paced: 11.09 %
Brady Statistic RV Percent Paced: 99.97 %
Date Time Interrogation Session: 20241010014319
Implantable Lead Connection Status: 753985
Implantable Lead Connection Status: 753985
Implantable Lead Connection Status: 753985
Implantable Lead Implant Date: 20130418
Implantable Lead Implant Date: 20130418
Implantable Lead Implant Date: 20150312
Implantable Lead Location: 753858
Implantable Lead Location: 753859
Implantable Lead Location: 753860
Implantable Pulse Generator Implant Date: 20231002
Lead Channel Impedance Value: 323 Ohm
Lead Channel Impedance Value: 3344 Ohm
Lead Channel Impedance Value: 3344 Ohm
Lead Channel Impedance Value: 3344 Ohm
Lead Channel Impedance Value: 399 Ohm
Lead Channel Impedance Value: 437 Ohm
Lead Channel Impedance Value: 456 Ohm
Lead Channel Impedance Value: 532 Ohm
Lead Channel Impedance Value: 608 Ohm
Lead Channel Pacing Threshold Amplitude: 0.75 V
Lead Channel Pacing Threshold Pulse Width: 0.4 ms
Lead Channel Sensing Intrinsic Amplitude: 2 mV
Lead Channel Sensing Intrinsic Amplitude: 2 mV
Lead Channel Sensing Intrinsic Amplitude: 24.625 mV
Lead Channel Sensing Intrinsic Amplitude: 24.625 mV
Lead Channel Setting Pacing Amplitude: 2 V
Lead Channel Setting Pacing Amplitude: 2 V
Lead Channel Setting Pacing Amplitude: 2.5 V
Lead Channel Setting Pacing Pulse Width: 0.4 ms
Lead Channel Setting Pacing Pulse Width: 0.8 ms
Lead Channel Setting Sensing Sensitivity: 5.6 mV
Zone Setting Status: 755011
Zone Setting Status: 755011

## 2023-08-17 NOTE — Progress Notes (Signed)
Remote pacemaker transmission.   

## 2023-09-08 DIAGNOSIS — I10 Essential (primary) hypertension: Secondary | ICD-10-CM | POA: Diagnosis not present

## 2023-09-08 DIAGNOSIS — I5042 Chronic combined systolic (congestive) and diastolic (congestive) heart failure: Secondary | ICD-10-CM | POA: Diagnosis not present

## 2023-09-08 DIAGNOSIS — I110 Hypertensive heart disease with heart failure: Secondary | ICD-10-CM | POA: Diagnosis not present

## 2023-09-08 DIAGNOSIS — I739 Peripheral vascular disease, unspecified: Secondary | ICD-10-CM | POA: Diagnosis not present

## 2023-09-08 DIAGNOSIS — Z23 Encounter for immunization: Secondary | ICD-10-CM | POA: Diagnosis not present

## 2023-09-08 DIAGNOSIS — Z950 Presence of cardiac pacemaker: Secondary | ICD-10-CM | POA: Diagnosis not present

## 2023-09-08 DIAGNOSIS — G4733 Obstructive sleep apnea (adult) (pediatric): Secondary | ICD-10-CM | POA: Diagnosis not present

## 2023-09-08 DIAGNOSIS — E7800 Pure hypercholesterolemia, unspecified: Secondary | ICD-10-CM | POA: Diagnosis not present

## 2023-09-08 DIAGNOSIS — J449 Chronic obstructive pulmonary disease, unspecified: Secondary | ICD-10-CM | POA: Diagnosis not present

## 2023-09-08 DIAGNOSIS — E119 Type 2 diabetes mellitus without complications: Secondary | ICD-10-CM | POA: Diagnosis not present

## 2023-09-08 DIAGNOSIS — Z0000 Encounter for general adult medical examination without abnormal findings: Secondary | ICD-10-CM | POA: Diagnosis not present

## 2023-09-08 DIAGNOSIS — I2510 Atherosclerotic heart disease of native coronary artery without angina pectoris: Secondary | ICD-10-CM | POA: Diagnosis not present

## 2023-09-08 DIAGNOSIS — E1151 Type 2 diabetes mellitus with diabetic peripheral angiopathy without gangrene: Secondary | ICD-10-CM | POA: Diagnosis not present

## 2023-09-08 DIAGNOSIS — E1142 Type 2 diabetes mellitus with diabetic polyneuropathy: Secondary | ICD-10-CM | POA: Diagnosis not present

## 2023-09-15 ENCOUNTER — Telehealth: Payer: Self-pay | Admitting: Cardiovascular Disease

## 2023-09-15 NOTE — Telephone Encounter (Signed)
Paper Work Dropped Off: Patient assistant forms  Date:09/14/2023  Location of paper:  Artist

## 2023-09-16 NOTE — Telephone Encounter (Signed)
Faxed pt assistance for [employer]

## 2023-09-28 DIAGNOSIS — E119 Type 2 diabetes mellitus without complications: Secondary | ICD-10-CM | POA: Diagnosis not present

## 2023-11-04 ENCOUNTER — Ambulatory Visit (INDEPENDENT_AMBULATORY_CARE_PROVIDER_SITE_OTHER): Payer: Medicare Other

## 2023-11-04 DIAGNOSIS — I442 Atrioventricular block, complete: Secondary | ICD-10-CM

## 2023-11-04 LAB — CUP PACEART REMOTE DEVICE CHECK
Battery Remaining Longevity: 86 mo
Battery Voltage: 2.99 V
Brady Statistic AP VP Percent: 24.17 %
Brady Statistic AP VS Percent: 0 %
Brady Statistic AS VP Percent: 75.81 %
Brady Statistic AS VS Percent: 0.02 %
Brady Statistic RA Percent Paced: 23.84 %
Brady Statistic RV Percent Paced: 99.98 %
Date Time Interrogation Session: 20250109043028
Implantable Lead Connection Status: 753985
Implantable Lead Connection Status: 753985
Implantable Lead Connection Status: 753985
Implantable Lead Implant Date: 20130418
Implantable Lead Implant Date: 20130418
Implantable Lead Implant Date: 20150312
Implantable Lead Location: 753858
Implantable Lead Location: 753859
Implantable Lead Location: 753860
Implantable Pulse Generator Implant Date: 20231002
Lead Channel Impedance Value: 304 Ohm
Lead Channel Impedance Value: 3344 Ohm
Lead Channel Impedance Value: 3344 Ohm
Lead Channel Impedance Value: 3344 Ohm
Lead Channel Impedance Value: 399 Ohm
Lead Channel Impedance Value: 418 Ohm
Lead Channel Impedance Value: 456 Ohm
Lead Channel Impedance Value: 532 Ohm
Lead Channel Impedance Value: 589 Ohm
Lead Channel Pacing Threshold Amplitude: 0.75 V
Lead Channel Pacing Threshold Pulse Width: 0.4 ms
Lead Channel Sensing Intrinsic Amplitude: 2.5 mV
Lead Channel Sensing Intrinsic Amplitude: 2.5 mV
Lead Channel Sensing Intrinsic Amplitude: 24.625 mV
Lead Channel Sensing Intrinsic Amplitude: 24.625 mV
Lead Channel Setting Pacing Amplitude: 2 V
Lead Channel Setting Pacing Amplitude: 2 V
Lead Channel Setting Pacing Amplitude: 2.5 V
Lead Channel Setting Pacing Pulse Width: 0.4 ms
Lead Channel Setting Pacing Pulse Width: 0.8 ms
Lead Channel Setting Sensing Sensitivity: 5.6 mV
Zone Setting Status: 755011
Zone Setting Status: 755011

## 2023-12-14 NOTE — Progress Notes (Signed)
 Remote pacemaker transmission.

## 2024-02-03 ENCOUNTER — Ambulatory Visit (INDEPENDENT_AMBULATORY_CARE_PROVIDER_SITE_OTHER): Payer: Medicare Other

## 2024-02-03 DIAGNOSIS — I442 Atrioventricular block, complete: Secondary | ICD-10-CM | POA: Diagnosis not present

## 2024-02-03 LAB — CUP PACEART REMOTE DEVICE CHECK
Battery Remaining Longevity: 83 mo
Battery Voltage: 2.99 V
Brady Statistic AP VP Percent: 17.3 %
Brady Statistic AP VS Percent: 0 %
Brady Statistic AS VP Percent: 82.68 %
Brady Statistic AS VS Percent: 0.02 %
Brady Statistic RA Percent Paced: 16.99 %
Brady Statistic RV Percent Paced: 99.98 %
Date Time Interrogation Session: 20250409232250
Implantable Lead Connection Status: 753985
Implantable Lead Connection Status: 753985
Implantable Lead Connection Status: 753985
Implantable Lead Implant Date: 20130418
Implantable Lead Implant Date: 20130418
Implantable Lead Implant Date: 20150312
Implantable Lead Location: 753858
Implantable Lead Location: 753859
Implantable Lead Location: 753860
Implantable Pulse Generator Implant Date: 20231002
Lead Channel Impedance Value: 304 Ohm
Lead Channel Impedance Value: 3344 Ohm
Lead Channel Impedance Value: 3344 Ohm
Lead Channel Impedance Value: 3344 Ohm
Lead Channel Impedance Value: 399 Ohm
Lead Channel Impedance Value: 399 Ohm
Lead Channel Impedance Value: 456 Ohm
Lead Channel Impedance Value: 513 Ohm
Lead Channel Impedance Value: 570 Ohm
Lead Channel Pacing Threshold Amplitude: 0.75 V
Lead Channel Pacing Threshold Pulse Width: 0.4 ms
Lead Channel Sensing Intrinsic Amplitude: 2.625 mV
Lead Channel Sensing Intrinsic Amplitude: 2.625 mV
Lead Channel Sensing Intrinsic Amplitude: 24.625 mV
Lead Channel Sensing Intrinsic Amplitude: 24.625 mV
Lead Channel Setting Pacing Amplitude: 2 V
Lead Channel Setting Pacing Amplitude: 2 V
Lead Channel Setting Pacing Amplitude: 2.5 V
Lead Channel Setting Pacing Pulse Width: 0.4 ms
Lead Channel Setting Pacing Pulse Width: 0.8 ms
Lead Channel Setting Sensing Sensitivity: 5.6 mV
Zone Setting Status: 755011
Zone Setting Status: 755011

## 2024-02-29 ENCOUNTER — Telehealth: Payer: Self-pay | Admitting: Cardiovascular Disease

## 2024-02-29 NOTE — Telephone Encounter (Signed)
 Pt reports heaviness in his chest and "aggravates me in my neck, sort of feels like I'm choking." States it's been going on for two week intermittently, but now heaviness has gotten steady. He says cleaning his house in preparation for a family reunion this weekend has him working pretty hard this week and causing some mental stress as well. Condones SOB that has worsened over last 2 weeks. All day yesterday he stayed in the recliner because he felt so bad. Any little activity yesterday wore him out, felt like he couldn't breathe, states since waking this morning he's felt much better.Asked if he'd tried NTG and he states no. He will try it today. He agrees that he knows something is going on and the pressure (and other symptoms are worsening). He says that he has a family reunion this weekend, expecting 100 ppl and he "cannot go to the hospital." He says that he doesn't live far from the emergency room and if he feels worse, he'll go in. Told him it sounds as if he's having a heart attack now (he's had bypass and several stents) and he really needs to go to ED. He refused again. Scheduled with Croitoru for tomorrow morning at 9:40, he at least agrees to come in.

## 2024-02-29 NOTE — Telephone Encounter (Signed)
   Pt c/o of Chest Pain: STAT if active CP, including tightness, pressure, jaw pain, radiating pain to shoulder/upper arm/back, CP unrelieved by Nitro. Symptoms reported of SOB, nausea, vomiting, sweating.  1. Are you having CP right now? Chest pain and neck pain- a little at this time    2. Are you experiencing any other symptoms (ex. SOB, nausea, vomiting, sweating)? Short of breath- been getting worse the last few weeks   3. Is your CP continuous or coming and going? staying   4. Have you taken Nitroglycerin ? no   5. How long have you been experiencing CP? About 2 weeks ago, but getting worse    6. If NO CP at time of call then end call with telling Pt to call back or call 911 if Chest pain returns prior to return call from triage team.

## 2024-03-01 ENCOUNTER — Other Ambulatory Visit (HOSPITAL_COMMUNITY): Payer: Self-pay

## 2024-03-01 ENCOUNTER — Ambulatory Visit: Attending: Cardiovascular Disease | Admitting: Cardiovascular Disease

## 2024-03-01 ENCOUNTER — Encounter: Payer: Self-pay | Admitting: Cardiovascular Disease

## 2024-03-01 VITALS — BP 140/66 | HR 66 | Ht 69.5 in | Wt 155.0 lb

## 2024-03-01 DIAGNOSIS — I5042 Chronic combined systolic (congestive) and diastolic (congestive) heart failure: Secondary | ICD-10-CM

## 2024-03-01 DIAGNOSIS — R0789 Other chest pain: Secondary | ICD-10-CM

## 2024-03-01 DIAGNOSIS — E782 Mixed hyperlipidemia: Secondary | ICD-10-CM

## 2024-03-01 DIAGNOSIS — I25708 Atherosclerosis of coronary artery bypass graft(s), unspecified, with other forms of angina pectoris: Secondary | ICD-10-CM

## 2024-03-01 DIAGNOSIS — E1151 Type 2 diabetes mellitus with diabetic peripheral angiopathy without gangrene: Secondary | ICD-10-CM | POA: Diagnosis not present

## 2024-03-01 DIAGNOSIS — R0602 Shortness of breath: Secondary | ICD-10-CM | POA: Diagnosis not present

## 2024-03-01 DIAGNOSIS — I7143 Infrarenal abdominal aortic aneurysm, without rupture: Secondary | ICD-10-CM | POA: Diagnosis not present

## 2024-03-01 DIAGNOSIS — I739 Peripheral vascular disease, unspecified: Secondary | ICD-10-CM

## 2024-03-01 DIAGNOSIS — I4719 Other supraventricular tachycardia: Secondary | ICD-10-CM

## 2024-03-01 DIAGNOSIS — I442 Atrioventricular block, complete: Secondary | ICD-10-CM | POA: Diagnosis not present

## 2024-03-01 DIAGNOSIS — J449 Chronic obstructive pulmonary disease, unspecified: Secondary | ICD-10-CM | POA: Diagnosis not present

## 2024-03-01 DIAGNOSIS — I10 Essential (primary) hypertension: Secondary | ICD-10-CM | POA: Diagnosis not present

## 2024-03-01 MED ORDER — ISOSORBIDE MONONITRATE ER 60 MG PO TB24: 60.0000 mg | ORAL_TABLET | Freq: Every day | ORAL | 3 refills | Status: DC

## 2024-03-01 MED ORDER — NITROGLYCERIN 0.4 MG SL SUBL
0.4000 mg | SUBLINGUAL_TABLET | SUBLINGUAL | 6 refills | Status: AC | PRN
  Filled 2024-03-01: qty 25, 8d supply, fill #0

## 2024-03-01 NOTE — Patient Instructions (Addendum)
 Medication Instructions:  INCREASE YOUR ISOSORBIDE  TO 60 MG DAILY   *If you need a refill on your cardiac medications before your next appointment, please call your pharmacy*  Lab Work: NONE   Testing/Procedures: CARDIAC PET SCAN  IF YOU DO NOT HEAR FROM THE OFFICE IN 2 WEEKS, CALL TO FOLLOW UP   Follow-Up: At [facility], you and your health needs are our priority.  As part of our continuing mission to provide you with exceptional heart care, our providers are all part of one team.  This team includes your primary Cardiologist (physician) and Advanced Practice Providers or APPs (Physician Assistants and Nurse Practitioners) who all work together to provide you with the care you need, when you need it.  Your next appointment:   6 month(s)  Provider:   Luana Rumple, MD     We recommend signing up for the patient portal called "MyChart".  Sign up information is provided on this After Visit Summary.  MyChart is used to connect with patients for Virtual Visits (Telemedicine).  Patients are able to view lab/test results, encounter notes, upcoming appointments, etc.  Non-urgent messages can be sent to your provider as well.   To learn more about what you can do with MyChart, go to [email or web address].   Other Instructions    Please report to Radiology at the [facility] Main Entrance 30 minutes early for your test.  [address] [place], [place] [address]                         OR   Please report to Radiology at [facility] Main Entrance, medical mall, 30 mins prior to your test.  [address]  [place], [place]  How to Prepare for Your Cardiac PET/CT Stress Test:  Nothing to eat or drink, except water, 3 hours prior to arrival time.  NO caffeine/decaffeinated products, or chocolate 12 hours prior to arrival. (Please note decaffeinated beverages (teas/coffees) still contain caffeine).  If you have caffeine within 12 hours  prior, the test will need to be rescheduled.  Medication instructions: Do not take erectile dysfunction medications for 72 hours prior to test (sildenafil, tadalafil) Do not take nitrates (isosorbide  mononitrate, Ranexa) the day before or day of test Do not take tamsulosin the day before or morning of test Hold theophylline containing medications for 12 hours. Hold Dipyridamole 48 hours prior to the test.  Diabetic Preparation: If able to eat breakfast prior to 3 hour fasting, you may take all medications, including your insulin . Do not worry if you miss your breakfast dose of insulin  - start at your next meal. If you do not eat prior to 3 hour fast-Hold all diabetes (oral and insulin ) medications. Patients who wear a continuous glucose monitor MUST remove the device prior to scanning.  You may take your remaining medications with water.  NO perfume, cologne or lotion on chest or abdomen area. FEMALES - Please avoid wearing dresses to this appointment.  Total time is 1 to 2 hours; you may want to bring reading material for the waiting time.  IF YOU THINK YOU MAY BE PREGNANT, OR ARE NURSING PLEASE INFORM THE TECHNOLOGIST.  In preparation for your appointment, medication and supplies will be purchased.  Appointment availability is limited, so if you need to cancel or reschedule, please call the Radiology Department Scheduler at [phone] 24 hours in advance to avoid a cancellation fee of $100.00  What to  Expect When you Arrive:  Once you arrive and check in for your appointment, you will be taken to a preparation room within the Radiology Department.  A technologist or Nurse will obtain your medical history, verify that you are correctly prepped for the exam, and explain the procedure.  Afterwards, an IV will be started in your arm and electrodes will be placed on your skin for EKG monitoring during the stress portion of the exam. Then you will be escorted to the PET/CT scanner.  There,  staff will get you positioned on the scanner and obtain a blood pressure and EKG.  During the exam, you will continue to be connected to the EKG and blood pressure machines.  A small, safe amount of a radioactive tracer will be injected in your IV to obtain a series of pictures of your heart along with an injection of a stress agent.    After your Exam:  It is recommended that you eat a meal and drink a caffeinated beverage to counter act any effects of the stress agent.  Drink plenty of fluids for the remainder of the day and urinate frequently for the first couple of hours after the exam.  Your doctor will inform you of your test results within 7-10 business days.  For more information and frequently asked questions, please visit our website: [email or web address]  For questions about your test or how to prepare for your test, please call: Cardiac Imaging Nurse Navigators Office: [phone]

## 2024-03-01 NOTE — Progress Notes (Signed)
 Patient ID: [name], male   DOB: 10-Jul-1946, 78 y.o.   MRN: [id number]    Cardiology Office Note    Date:  03/01/2024   ID:  [name], DOB 1946-08-13, MRN [id number]  PCP:  Merl Star, MD  Cardiologist:  Manya Sells, MD:  Luana Rumple, MD   Chief Complaint  Patient presents with   Chest Pain    History of Present Illness:  [name] is a 78 y.o. male with CAD (multiple PCI before CABG 2007; SVG-OM1-OM2 and SVG-PLA), ischemic cardiomyopathy, chronic systolic and diastolic heart failure, complete heart block, frequent sustained paroxysmal atrial tachycardia and a biventricular pacemaker (new generator August 04, 2022 Medtronic Percepta CRT-P, original leads 2015). He also has advanced chronic obstructive lung disease related to long-standing previous smoking, type 2 diabetes mellitus on oral antidiabetics, small AAA (3 cm by US  09/2017), moderate bilateral PAD of lower extremities.  He has recently developed chest pain and throat tightness reminiscent of his previous angina.  There was no obvious trigger but his symptoms resolved with a single sublingual nitroglycerin  tablet.  The timing of his angina events do not coincide with episodes of paroxysmal atrial tachycardia documented by his pacemaker.  Otherwise he does well and has good exercise tolerance, especially after starting treatment with Jardiance .  He has not had lower extremity edema, orthopnea, PND.  He has furosemide  available as needed for weight gain, but has not needed to take it.  He has not had dizziness, palpitations or syncope.  He has lower extremity neuropathy, but does not have symptoms of carpal tunnel syndrome.  Interrogation of his pacemaker shows normal device function with estimated 6.8 years of generator longevity, 15% atrial pacing and 100% biventricular pacing (he is pacemaker dependent).  LV pacing is programmed to tip to can.  His OptiVol has been in normal range over the last 6 months.  He has rare  episodes of paroxysmal atrial tachycardia, but these can be sustained up to about 6 minutes.  The frequency of these events has been substantially higher in the last 4 months for a cumulative total of 4 hours of atrial tachycardia in those 4 months.  He has not had any atrial fibrillation.  His most recent echocardiogram performed in November 2023 shows normal LVEF 55 to 60%, impaired laxation pattern of the mitral inflow and mildly abnormal global longitudinal strain at -16.5%.  There are no serious valve abnormalities.  He last underwent evaluation for his small abdominal aortic aneurysm in January 2022 when it measured 3.1 cm, essentially unchanged from previous evaluation.  He also has stable moderate bilateral iliac stenoses that appear to be asymptomatic.  We have decided to perform ultrasound follow-up less frequently since the findings have been so stable.  Metabolic parameters look good with a hemoglobin A1c of 7.4% and LDL cholesterol 50, HDL 38.  He has normal renal function.  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  Enjoys his grandson.    He has a long-standing history of coronary disease and underwent bypass surgery in 2007. He had left bundle branch block for years and in 2013 received a dual-chamber permanent pacemaker for complete heart block Just before getting his pacemaker he underwent cardiac catheterization which showed a patent sequential saphenous vein graft bypass to the first and second oblique marginal arteries (both of these vessels had previously been stented and had severe  in-stent restenosis).   He has no underlying escape rhythm and is 100% paced. He requires relatively high doses of beta blockers for control of recurrent paroxysmal atrial tachycardia which was quite symptomatic. Left ventricular ejection fraction had been 45-50% prior to pacemaker implantation. Following implantation of  his pacemaker his echocardiogram showed moderately depressed left ventricular systolic function with an ejection fraction of 35-40%. He improved following biventricular pacemaker upgrade, but then had lead dislodgment and deteriorated again. After repositioning of his left ventricular lead, left ventricular ejection fraction increased to 55%. Attempts at increased ACE inhibitor in the past led to severe symptomatic hypotension.  May 2017 had lower extremity arterial Dopplers showing  >50% right external and left common iliac artery stenosis. 30-49% right SFA disease, without focal stenosis. 50-74% left distal SFA stenosis. Occluded bilateral anterior tibial artery disease, two vessel run-off, bilaterally. Bilateral ABI 1.1-1.26 September 2017 AAA 3.0 cm. September 2020 AAA 3.0 cm January 2022 AAA 3.1 cm Right ABI 1.21, left ABI 0.80   Past Medical History:  Diagnosis Date   Atrial tachycardia (HCC)    Cardiomyopathy (HCC)    mixed ischemic and nonischemic   CHB (complete heart block) (HCC) 02/11/12   Medtronic permanent pacemaker   CHF (congestive heart failure) (HCC)    COPD (chronic obstructive pulmonary disease) (HCC)    Coronary artery disease    DOE (dyspnea on exertion)    NYHA class 2-3   DVT (deep venous thrombosis) (HCC) 12/2005   RLE "had to have OR"   Dyslipidemia    GERD (gastroesophageal reflux disease)    Hypertension    LBBB (left bundle branch block)    Myocardial infarction (HCC)    "I've had 6" (01/03/2014)   Psoriasis    Sleep apnea    c pap at home, does not use   Type II diabetes mellitus (HCC)     Past Surgical History:  Procedure Laterality Date   BI-VENTRICULAR PACEMAKER REVISION N/A 03/06/2014   Procedure: BI-VENTRICULAR PACEMAKER REVISION (CRT-R);  Surgeon: Tammie Fall, MD;  Location: [facility] CATH LAB;  Service: Cardiovascular;  Laterality: N/A;   BIV PACEMAKER GENERATOR CHANGEOUT N/A 07/27/2022   Procedure: BIV PACEMAKER GENERATOR CHANGEOUT;  Surgeon:  Tammie Fall, MD;  Location: [facility] INVASIVE CV LAB;  Service: Cardiovascular;  Laterality: N/A;   CARDIAC CATHETERIZATION  02/10/12   left CX: dominant w/40% prox. AV groove stenosis.The first & second marginal branches had stents visualized near the ostium of the vessel bu were functionally occluded. The remainder of the dominant CX had minor irregularities.  RCA: nondominant free of sign. disease. SVG to OM1 & OM2 sequentially was widely patent.   CATARACT EXTRACTION Left    CORONARY ANGIOPLASTY WITH STENT PLACEMENT     "I had 9 stents before the [facility] in 2007" (01/03/2014)   CORONARY ARTERY BYPASS GRAFT  2007   x 3   ELECTROPHYSIOLOGY STUDY  01/03/2014   EPS by Dr Carolynne Citron with no inducible ventricular arrhythmias   ELECTROPHYSIOLOGY STUDY N/A 01/03/2014   Procedure: ELECTROPHYSIOLOGY STUDY;  Surgeon: Tammie Fall, MD;  Location: [facility] CATH LAB;  Service: Cardiovascular;  Laterality: N/A;   LEAD REVISION  03-06-2014   LV lead revision by Dr Carolynne Citron   LEFT HEART CATHETERIZATION WITH CORONARY/GRAFT ANGIOGRAM  02/10/2012   Procedure: LEFT HEART CATHETERIZATION WITH Estella Helling;  Surgeon: Avanell Leigh, MD;  Location: [facility] CATH LAB;  Service: Cardiovascular;;   NM MYOCAR PERF WALL MOTION  10/02/2009   no significant ischemia  PACEMAKER INSERTION  2013; 01/03/2014   MDT dual chamber pacemaker implanted 2013 for complete heart block; upgrade to CRTP (MDT) by Dr Carolynne Citron 12/2013 due to cardiomyopathy and heart failure   PERMANENT PACEMAKER INSERTION N/A 02/11/2012   Procedure: PERMANENT PACEMAKER INSERTION;  Surgeon: Luana Rumple, MD;  Location: [facility] CATH LAB;  Service: Cardiovascular;  Laterality: N/A;   REFRACTIVE SURGERY Right    TEMPORARY PACEMAKER INSERTION Bilateral 02/10/2012   Procedure: TEMPORARY PACEMAKER INSERTION;  Surgeon: Avanell Leigh, MD;  Location: [facility] CATH LAB;  Service: Cardiovascular;  Laterality: Bilateral;   US  ECHOCARDIOGRAPHY  01/10/10   LV systolic fx mod to severely  reduced, EF 35-40%, AOV mildly sclerotic    Current Medications: Outpatient Medications Prior to Visit  Medication Sig Dispense Refill   aspirin  EC 81 MG tablet Take 81 mg by mouth daily.     benazepril  (LOTENSIN ) 5 MG tablet Take 5 mg by mouth daily.     clopidogrel  (PLAVIX ) 75 MG tablet TAKE 1 TABLET BY MOUTH  DAILY 90 tablet 2   empagliflozin  (JARDIANCE ) 10 MG TABS tablet Take 1 tablet (10 mg total) by mouth daily before breakfast. 30 tablet 11   furosemide  (LASIX ) 20 MG tablet Take 2 tablets (40 mg total) by mouth daily as needed for fluid or edema (for shortness of  breath and swelling. Please take for weight greater than 166lb). MAY TAKE 40mg   DAILY AS NEEDED FOR SWELLING/SHORTNESS OF BREATH and WEIGHT OVER 166lbs 30 tablet 3   glipiZIDE  (GLUCOTROL  XL) 5 MG 24 hr tablet Take 5 mg by mouth in the morning and at bedtime.     metFORMIN  (GLUCOPHAGE ) 1000 MG tablet Take 1 tablet (1,000 mg total) by mouth 2 (two) times daily with a meal. HOLD for 2 days. Restart 03/10/2014.     metoprolol  tartrate (LOPRESSOR ) 100 MG tablet TAKE 1 TABLET BY MOUTH TWO  TIMES DAILY 180 tablet 2   pantoprazole  (PROTONIX ) 40 MG tablet Take 40 mg by mouth daily as needed (acid reflux).      rosuvastatin  (CRESTOR ) 10 MG tablet TAKE 1 TABLET BY MOUTH  DAILY 90 tablet 3   isosorbide  mononitrate (IMDUR ) 30 MG 24 hr tablet TAKE 1 TABLET BY MOUTH  DAILY 30 tablet 1   nitroGLYCERIN  (NITROSTAT ) 0.4 MG SL tablet Place 1 tablet (0.4 mg total) under the tongue every 5 (five) minutes as needed for chest pain. (Patient not taking: Reported on 03/01/2024) 25 tablet 6   No facility-administered medications prior to visit.     Allergies:   Pollen extract   Family History:  The patient's family history includes Cancer in his father.   ROS:   Please see the history of present illness.    ROS All other systems are reviewed and are negative.  PHYSICAL EXAM:   VS:  BP (!) 140/66 (BP Location: Left Arm, Patient Position: Sitting,  Cuff Size: Normal)   Pulse 66   Ht 5' 9.5" (1.765 m)   Wt 70.3 kg   SpO2 96%   BMI 22.56 kg/m       General: Alert, oriented x3, no distress, appears lean and fit.  Healthy subclavian pacemaker site. Head: no evidence of trauma, PERRL, EOMI, no exophtalmos or lid lag, no myxedema, no xanthelasma; normal ears, nose and oropharynx Neck: normal jugular venous pulsations and no hepatojugular reflux; brisk carotid pulses without delay and no carotid bruits Chest: clear to auscultation, no signs of consolidation by percussion or palpation, normal fremitus, symmetrical and full respiratory excursions  Cardiovascular: normal position and quality of the apical impulse, regular rhythm, normal first and second heart sounds, no murmurs, rubs or gallops Abdomen: no tenderness or distention, no masses by palpation, no abnormal pulsatility or arterial bruits, normal bowel sounds, no hepatosplenomegaly Extremities: no clubbing, cyanosis or edema; 2+ radial, ulnar and brachial pulses bilaterally; 2+ right femoral, posterior tibial and dorsalis pedis pulses; 2+ left femoral, posterior tibial and dorsalis pedis pulses; no subclavian or femoral bruits Neurological: grossly nonfocal Psych: Normal mood and affect      Wt Readings from Last 3 Encounters:  03/01/24 70.3 kg  11/18/22 71.9 kg  10/30/22 72.9 kg      Studies/Labs Reviewed:   EKG:    EKG Interpretation Date/Time:  Wednesday Mar 01 2024 10:04:35 EDT Ventricular Rate:  66 PR Interval:  140 QRS Duration:  162 QT Interval:  450 QTC Calculation: 471 R Axis:   -41  Text Interpretation: Normal sinus rhythm Left axis deviation Right bundle branch block Left ventricular hypertrophy with repolarization abnormality ( R in aVL , Romhilt-Estes ) Cannot rule out Anterior infarct , age undetermined When compared with ECG of 07-Mar-2014 05:53, Sinus rhythm has replaced Electronic ventricular pacemaker Confirmed by Shawntel Farnworth [phone]) on 03/01/2024  10:16:50 AM        Recent Labs:  09/08/2023 Cholesterol 112, HDL 38, LDL 50, triglycerides 142 Hemoglobin A1c 7.4% Hemoglobin 14.4, potassium 4.4, ALT 15, TSH 1.480  ASSESSMENT:    1. Chest pressure   2. SOB (shortness of breath)   3. Coronary artery disease involving coronary bypass graft of native heart with other forms of angina pectoris (HCC)   4. Chronic combined systolic and diastolic CHF, NYHA class 3 (HCC)   5. CHB (complete heart block) (HCC)   6. Essential hypertension   7. PAT (paroxysmal atrial tachycardia) (HCC)   8. Chronic obstructive pulmonary disease, unspecified COPD type (HCC)   9. Mixed hyperlipidemia   10. Diabetes mellitus with peripheral vascular disease (HCC)   11. PAD (peripheral artery disease) (HCC)   12. Infrarenal abdominal aortic aneurysm (AAA) without rupture (HCC)       PLAN:  In order of problems listed above:  CAD: He has had angina pectoris recently, for the first time in many years.  It resolved promptly with a single sublingual nitroglycerin  tablet.  He has had extensive stenting and then bypassing of the branches of the left circumflex coronary artery (sequential SVG to OM1 and OM 2 and SVG-PLA, all of which were patent on his cardiac catheterization in 2013).  Will schedule for PET scan. Known inferior scar by previous nuclear stress testing, not performed since 2015.  On dual antiplatelet therapy chronically.  On lipid-lowering medications. CHF: On Jardiance  and ACE inhibitors (did not tolerate higher doses and would not tolerate Entresto).   He has recovered LVEF following CRT.  Clinically euvolemic and also euvolemic based on thoracic impedance readings from his pacemaker.  He did not tolerate higher doses of ACE inhibitor due to hypotension and is unlikely to tolerate Entresto.  Consider superimposed cardiac amyloidosis (neuropathy, orthostatic dizziness, diminished e' and s', not quite "5-5-5 sign").  Have considered PYP scan.  Will focus  first on potential worsening ischemic issues. CHB: Pacemaker dependent HTN: Will not tolerate higher doses of medications. PAT: There has been a recent marked increased in the burden of atrial tachycardia, although there is still remains up well under 1%.  Episodes are sometimes a few minutes in duration.  Have not been able to  establish a correlation between when these episodes happen and his recent episodes of chest pain.  He has not had overt atrial fibrillation during many years of monitoring with his pacemaker.  On aspirin  and clopidogrel  for PAD. COPD: Not currently requiring bronchodilators.  He has not smoked since his first episode of myocardial infarction. CRT-P: Normalization of LVEF with biventricular pacing.  Normal device function. HLP: All lipid parameters are in desirable range on statin therapy.  Continue DM, complicated by PAD: Avoid Actos due to history of heart failure.  Doing well on Jardiance  and metformin .  Acceptable hemoglobin A1c, although ideally would like to see it less than 7%.Aaron Aas PAD: Denies intermittent claudication and must have good collateral formation.  Slightly reduced ABI on the left.  Extensive infrapopliteal disease as well as iliac artery stenoses.   AAA: Infrarenal fusiform dilation of the distal aorta last measured 3.1 cm in January 2022, essentially unchanged over the preceding 5 years.  Plan to reevaluate this.    Medication Adjustments/Labs and Tests Ordered: Current medicines are reviewed at length with the patient today.  Concerns regarding medicines are outlined above.  Medication changes, Labs and Tests ordered today are listed in the Patient Instructions below. Patient Instructions  Medication Instructions:  INCREASE YOUR ISOSORBIDE  TO 60 MG DAILY   *If you need a refill on your cardiac medications before your next appointment, please call your pharmacy*  Lab Work: NONE   Testing/Procedures: CARDIAC PET SCAN  IF YOU DO NOT HEAR FROM THE  OFFICE IN 2 WEEKS, CALL TO FOLLOW UP   Follow-Up: At [facility], you and your health needs are our priority.  As part of our continuing mission to provide you with exceptional heart care, our providers are all part of one team.  This team includes your primary Cardiologist (physician) and Advanced Practice Providers or APPs (Physician Assistants and Nurse Practitioners) who all work together to provide you with the care you need, when you need it.  Your next appointment:   6 month(s)  Provider:   Luana Rumple, MD     We recommend signing up for the patient portal called "MyChart".  Sign up information is provided on this After Visit Summary.  MyChart is used to connect with patients for Virtual Visits (Telemedicine).  Patients are able to view lab/test results, encounter notes, upcoming appointments, etc.  Non-urgent messages can be sent to your provider as well.   To learn more about what you can do with MyChart, go to [email or web address].   Other Instructions    Please report to Radiology at the [facility] Main Entrance 30 minutes early for your test.  [address] [place], [place] [address]                         OR   Please report to Radiology at [facility] Main Entrance, medical mall, 30 mins prior to your test.  [address]  [place], [place]  How to Prepare for Your Cardiac PET/CT Stress Test:  Nothing to eat or drink, except water, 3 hours prior to arrival time.  NO caffeine/decaffeinated products, or chocolate 12 hours prior to arrival. (Please note decaffeinated beverages (teas/coffees) still contain caffeine).  If you have caffeine within 12 hours prior, the test will need to be rescheduled.  Medication instructions: Do not take erectile dysfunction medications for 72 hours prior to test (sildenafil, tadalafil) Do not take nitrates (isosorbide  mononitrate, Ranexa)  the day before or day of test Do not take  tamsulosin the day before or morning of test Hold theophylline containing medications for 12 hours. Hold Dipyridamole 48 hours prior to the test.  Diabetic Preparation: If able to eat breakfast prior to 3 hour fasting, you may take all medications, including your insulin . Do not worry if you miss your breakfast dose of insulin  - start at your next meal. If you do not eat prior to 3 hour fast-Hold all diabetes (oral and insulin ) medications. Patients who wear a continuous glucose monitor MUST remove the device prior to scanning.  You may take your remaining medications with water.  NO perfume, cologne or lotion on chest or abdomen area. FEMALES - Please avoid wearing dresses to this appointment.  Total time is 1 to 2 hours; you may want to bring reading material for the waiting time.  IF YOU THINK YOU MAY BE PREGNANT, OR ARE NURSING PLEASE INFORM THE TECHNOLOGIST.  In preparation for your appointment, medication and supplies will be purchased.  Appointment availability is limited, so if you need to cancel or reschedule, please call the Radiology Department Scheduler at [phone] 24 hours in advance to avoid a cancellation fee of $100.00  What to Expect When you Arrive:  Once you arrive and check in for your appointment, you will be taken to a preparation room within the Radiology Department.  A technologist or Nurse will obtain your medical history, verify that you are correctly prepped for the exam, and explain the procedure.  Afterwards, an IV will be started in your arm and electrodes will be placed on your skin for EKG monitoring during the stress portion of the exam. Then you will be escorted to the PET/CT scanner.  There, staff will get you positioned on the scanner and obtain a blood pressure and EKG.  During the exam, you will continue to be connected to the EKG and blood pressure machines.  A small, safe amount of a radioactive tracer will be injected in your IV to obtain a series  of pictures of your heart along with an injection of a stress agent.    After your Exam:  It is recommended that you eat a meal and drink a caffeinated beverage to counter act any effects of the stress agent.  Drink plenty of fluids for the remainder of the day and urinate frequently for the first couple of hours after the exam.  Your doctor will inform you of your test results within 7-10 business days.  For more information and frequently asked questions, please visit our website: [email or web address]  For questions about your test or how to prepare for your test, please call: Cardiac Imaging Nurse Navigators Office: [phone]        Signed, Luana Rumple, MD  03/01/2024 3:55 PM    [facility] [address] [place], [place], [place]  [address] Phone: [phone]; Fax: [phone]

## 2024-03-04 ENCOUNTER — Other Ambulatory Visit: Payer: Self-pay

## 2024-03-04 ENCOUNTER — Emergency Department (HOSPITAL_BASED_OUTPATIENT_CLINIC_OR_DEPARTMENT_OTHER)
Admission: EM | Admit: 2024-03-04 | Discharge: 2024-03-04 | Disposition: A | Discharge status: Home / Self Care | Attending: Emergency Medicine | Admitting: Emergency Medicine

## 2024-03-04 ENCOUNTER — Emergency Department (HOSPITAL_BASED_OUTPATIENT_CLINIC_OR_DEPARTMENT_OTHER)

## 2024-03-04 ENCOUNTER — Encounter (HOSPITAL_BASED_OUTPATIENT_CLINIC_OR_DEPARTMENT_OTHER): Payer: Self-pay | Admitting: Emergency Medicine

## 2024-03-04 DIAGNOSIS — I5042 Chronic combined systolic (congestive) and diastolic (congestive) heart failure: Secondary | ICD-10-CM | POA: Insufficient documentation

## 2024-03-04 DIAGNOSIS — J449 Chronic obstructive pulmonary disease, unspecified: Secondary | ICD-10-CM | POA: Insufficient documentation

## 2024-03-04 DIAGNOSIS — I2510 Atherosclerotic heart disease of native coronary artery without angina pectoris: Secondary | ICD-10-CM | POA: Diagnosis not present

## 2024-03-04 DIAGNOSIS — R918 Other nonspecific abnormal finding of lung field: Secondary | ICD-10-CM | POA: Diagnosis not present

## 2024-03-04 DIAGNOSIS — Z7901 Long term (current) use of anticoagulants: Secondary | ICD-10-CM | POA: Insufficient documentation

## 2024-03-04 DIAGNOSIS — R55 Syncope and collapse: Secondary | ICD-10-CM | POA: Insufficient documentation

## 2024-03-04 DIAGNOSIS — Z7982 Long term (current) use of aspirin: Secondary | ICD-10-CM | POA: Insufficient documentation

## 2024-03-04 DIAGNOSIS — E119 Type 2 diabetes mellitus without complications: Secondary | ICD-10-CM | POA: Insufficient documentation

## 2024-03-04 DIAGNOSIS — E860 Dehydration: Secondary | ICD-10-CM | POA: Diagnosis not present

## 2024-03-04 DIAGNOSIS — Z87891 Personal history of nicotine dependence: Secondary | ICD-10-CM | POA: Diagnosis not present

## 2024-03-04 DIAGNOSIS — Z79899 Other long term (current) drug therapy: Secondary | ICD-10-CM | POA: Insufficient documentation

## 2024-03-04 DIAGNOSIS — Z7984 Long term (current) use of oral hypoglycemic drugs: Secondary | ICD-10-CM | POA: Insufficient documentation

## 2024-03-04 DIAGNOSIS — I110 Hypertensive heart disease with heart failure: Secondary | ICD-10-CM | POA: Diagnosis not present

## 2024-03-04 DIAGNOSIS — I959 Hypotension, unspecified: Secondary | ICD-10-CM | POA: Diagnosis not present

## 2024-03-04 DIAGNOSIS — J439 Emphysema, unspecified: Secondary | ICD-10-CM | POA: Diagnosis not present

## 2024-03-04 DIAGNOSIS — R42 Dizziness and giddiness: Secondary | ICD-10-CM | POA: Diagnosis not present

## 2024-03-04 DIAGNOSIS — Z950 Presence of cardiac pacemaker: Secondary | ICD-10-CM | POA: Insufficient documentation

## 2024-03-04 LAB — CBC
HCT: 44.7 % (ref 39.0–52.0)
Hemoglobin: 15.2 g/dL (ref 13.0–17.0)
MCH: 32.9 pg (ref 26.0–34.0)
MCHC: 34 g/dL (ref 30.0–36.0)
MCV: 96.8 fL (ref 80.0–100.0)
Platelets: 324 10*3/uL (ref 150–400)
RBC: 4.62 MIL/uL (ref 4.22–5.81)
RDW: 12.9 % (ref 11.5–15.5)
WBC: 12 10*3/uL — ABNORMAL HIGH (ref 4.0–10.5)
nRBC: 0 % (ref 0.0–0.2)

## 2024-03-04 LAB — URINALYSIS, W/ REFLEX TO CULTURE (INFECTION SUSPECTED)
Bilirubin Urine: NEGATIVE
Glucose, UA: >=500 mg/dL — AB
Hgb urine dipstick: NEGATIVE
Ketones, ur: NEGATIVE mg/dL
Leukocytes,Ua: NEGATIVE
Nitrite: NEGATIVE
Protein, ur: NEGATIVE mg/dL
Specific Gravity, Urine: 1.015 (ref 1.005–1.030)
pH: 5.5 (ref 5.0–8.0)

## 2024-03-04 LAB — BASIC METABOLIC PANEL WITH GFR
Anion gap: 17 — ABNORMAL HIGH (ref 5–15)
BUN: 27 mg/dL — ABNORMAL HIGH (ref 8–23)
CO2: 24 mmol/L (ref 22–32)
Calcium: 9.9 mg/dL (ref 8.9–10.3)
Chloride: 99 mmol/L (ref 98–111)
Creatinine, Ser: 1.22 mg/dL (ref 0.61–1.24)
GFR, Estimated: >60 mL/min (ref >60)
Glucose, Bld: 240 mg/dL — ABNORMAL HIGH (ref 70–99)
Potassium: 4.8 mmol/L (ref 3.5–5.1)
Sodium: 140 mmol/L (ref 135–145)

## 2024-03-04 LAB — TROPONIN T, HIGH SENSITIVITY
Troponin T High Sensitivity: 19 ng/L — ABNORMAL HIGH (ref <19)
Troponin T High Sensitivity: 16 ng/L (ref <19)

## 2024-03-04 MED ORDER — LACTATED RINGERS IV BOLUS
1000.0000 mL | Freq: Once | INTRAVENOUS | Status: AC
  Administered 2024-03-04: 1000 mL via INTRAVENOUS

## 2024-03-04 NOTE — ED Notes (Signed)
 Pt ambulated to restroom and back unassisted without any difficulties

## 2024-03-04 NOTE — ED Triage Notes (Signed)
 Pt was at a family reunion today when he became lightheaded and clammy; denies pain, NAD at this time; had eaten today, but son says he didn't eat a lot

## 2024-03-04 NOTE — Discharge Instructions (Addendum)
 We waited you for your near fainting episode.  Your testing suggest that you were dehydrated.  Your symptoms improved with IV fluids.  We also evaluated your pacemaker and checked your cardiac testing which was reassuring.  Please follow-up with your primary physician.  If you have any new or worsening symptoms such as fainting, chest pain, difficulty breathing, fevers or chills, lightheadedness or dizziness, or any other new symptoms, please return to the emergency department.

## 2024-03-04 NOTE — ED Provider Notes (Signed)
 [facility] Provider Note  CSN: [id number] Arrival date & time: 03/04/24 1613  Chief Complaint(s) Near Syncope  HPI [name] is a 78 y.o. male history of complete heart block status post pacemaker, CHF, coronary artery disease, COPD presenting to the emergency department with near syncope.  Patient was at a family reunion.  He was sitting at the table when he felt lightheaded and dizzy.Aaron Aas  Also reported some slight chest tightness and shortness of breath which resolved, but denies chest pain.  Did not feel nauseous, no vomiting.  No loss of consciousness.  Paramedics were called, found patient to be hypotensive when standing.  He reports that when he is sitting down or laying down he feels okay.  He declined EMS transport.  He does take diuretics occasionally but as needed not taking currently.  Denies any abdominal pain.  Symptoms mild.   Past Medical History Past Medical History:  Diagnosis Date   Atrial tachycardia (HCC)    Cardiomyopathy (HCC)    mixed ischemic and nonischemic   CHB (complete heart block) (HCC) 02/11/12   [employer]tronic permanent pacemaker   CHF (congestive heart failure) (HCC)    COPD (chronic obstructive pulmonary disease) (HCC)    Coronary artery disease    DOE (dyspnea on exertion)    NYHA class 2-3   DVT (deep venous thrombosis) (HCC) 12/2005   RLE "had to have OR"   Dyslipidemia    GERD (gastroesophageal reflux disease)    Hypertension    LBBB (left bundle branch block)    Myocardial infarction (HCC)    "I've had 6" (01/03/2014)   Psoriasis    Sleep apnea    c pap at home, does not use   Type II diabetes mellitus [facility])    Patient Active Problem List   Diagnosis Date Noted   PAD (peripheral artery disease) (HCC) 03/21/2017   AAA (abdominal aortic aneurysm) (HCC) 03/21/2017   Mechanical complication of other vascular device, implant, and graft 03/06/2014   Cardiac resynchronization therapy pacemaker (CRT-P)  in place 02/17/2014   Pacemaker lead malfunction 02/17/2014   CHB (complete heart block) (HCC) 02/17/2014   Chronic systolic heart failure (HCC) 01/03/2014   Chronic combined systolic and diastolic CHF, NYHA class 3 (HCC) 11/24/2013   Complete heart block (HCC) 02/10/2012   CAD (coronary artery disease) of bypass graft, CABG 2007, cath Feb 2011 10/16/2011   PAF (paroxysmal atrial fibrillation), history of PAF in past, in NSR on adm 10/16/2011   LBBB (left bundle branch block) 10/16/2011   HTN (hypertension) 10/16/2011   Dyslipidemia 10/16/2011   Chest pain 10/16/2011   Myocardial infarction (HCC)    Psoriasis    DM, Type 2 NIDDM 08/24/2008   Obstructive sleep apnea 08/24/2008   COPD (chronic obstructive pulmonary disease) (HCC) 08/24/2008   DYSPNEA ON EXERTION 08/24/2008   Home Medication(s) Prior to Admission medications   Medication Sig Start Date End Date Taking? Authorizing Provider  aspirin  EC 81 MG tablet Take 81 mg by mouth daily.    [provider]  benazepril  (LOTENSIN ) 5 MG tablet Take 5 mg by mouth daily. 04/22/22   [provider]  clopidogrel  (PLAVIX ) 75 MG tablet TAKE 1 TABLET BY MOUTH  DAILY 10/06/17   Croitoru, Karyl Paget, MD  empagliflozin  (JARDIANCE ) 10 MG TABS tablet Take 1 tablet (10 mg total) by mouth daily before breakfast. 10/12/22   Croitoru, Mihai, MD  furosemide  (LASIX ) 20 MG tablet Take 2 tablets (40 mg total)  by mouth daily as needed for fluid or edema (for shortness of  breath and swelling. Please take for weight greater than 166lb). MAY TAKE 40mg   DAILY AS NEEDED FOR SWELLING/SHORTNESS OF BREATH and WEIGHT OVER 166lbs 10/01/22   Croitoru, Mihai, MD  glipiZIDE  (GLUCOTROL  XL) 5 MG 24 hr tablet Take 5 mg by mouth in the morning and at bedtime. 05/10/16   [provider]  isosorbide  mononitrate (IMDUR ) 60 MG 24 hr tablet Take 1 tablet (60 mg total) by mouth daily. 03/01/24   Croitoru, Mihai, MD  metFORMIN  (GLUCOPHAGE ) 1000 MG tablet Take 1  tablet (1,000 mg total) by mouth 2 (two) times daily with a meal. HOLD for 2 days. Restart 03/10/2014. 03/07/14   Edmisten, Arsenio Larger, PA-C  metoprolol  tartrate (LOPRESSOR ) 100 MG tablet TAKE 1 TABLET BY MOUTH TWO  TIMES DAILY 10/06/17   Croitoru, Karyl Paget, MD  nitroGLYCERIN  (NITROSTAT ) 0.4 MG SL tablet Place 1 tablet (0.4 mg total) under the tongue every 5 (five) minutes as needed for chest pain. max 3 tablets per event. call 911 if no relief 03/01/24   Croitoru, Mihai, MD  pantoprazole  (PROTONIX ) 40 MG tablet Take 40 mg by mouth daily as needed (acid reflux).     [provider]  rosuvastatin  (CRESTOR ) 10 MG tablet TAKE 1 TABLET BY MOUTH  DAILY 03/30/18   Millicent Ally, MD                                                                                                                                    Past Surgical History Past Surgical History:  Procedure Laterality Date   BI-VENTRICULAR PACEMAKER REVISION N/A 03/06/2014   Procedure: BI-VENTRICULAR PACEMAKER REVISION (CRT-R);  Surgeon: Tammie Fall, MD;  Location[facility] CATH LAB;  Service: Cardiovascular;  Laterality: N/A;   BIV PACEMAKER GENERATOR CHANGEOUT N/A 07/27/2022   Procedure: BIV PACEMAKER GENERATOR CHANGEOUT;  Surgeon: Tammie Fall, MD;  Location: [facility] INVASIVE CV LAB;  Service: Cardiovascular;  Laterality: N/A;   CARDIAC CATHETERIZATION  02/10/12   left CX: dominant w/40% prox. AV groove stenosis.The first & second marginal branches had stents visualized near the ostium of the vessel bu were functionally occluded. The remainder of the dominant CX had minor irregularities.  RCA: nondominant free of sign. disease. SVG to OM1 & OM2 sequentially was widely patent.   CATARACT EXTRACTION Left    CORONARY ANGIOPLASTY WITH STENT PLACEMENT     "I had 9 stents before the [facility] in 2007" (01/03/2014)   CORONARY ARTERY BYPASS GRAFT  2007   x 3   ELECTROPHYSIOLOGY STUDY  01/03/2014   EPS by Dr Carolynne Citron with no inducible ventricular arrhythmias    ELECTROPHYSIOLOGY STUDY N/A 01/03/2014   Procedure: ELECTROPHYSIOLOGY STUDY;  Surgeon: Tammie Fall, MD;  Location: [facility] CATH LAB;  Service: Cardiovascular;  Laterality: N/A;   LEAD REVISION  03-06-2014   LV lead revision by Dr Carolynne Citron  LEFT HEART CATHETERIZATION WITH CORONARY/GRAFT ANGIOGRAM  02/10/2012   Procedure: LEFT HEART CATHETERIZATION WITH Estella Helling;  Surgeon: Avanell Leigh, MD;  Location: [facility] CATH LAB;  Service: Cardiovascular;;   NM MYOCAR PERF WALL MOTION  10/02/2009   no significant ischemia   PACEMAKER INSERTION  2013; 01/03/2014   MDT dual chamber pacemaker implanted 2013 for complete heart block; upgrade to CRTP (MDT) by Dr Carolynne Citron 12/2013 due to cardiomyopathy and heart failure   PERMANENT PACEMAKER INSERTION N/A 02/11/2012   Procedure: PERMANENT PACEMAKER INSERTION;  Surgeon: Luana Rumple, MD;  Location: [facility] CATH LAB;  Service: Cardiovascular;  Laterality: N/A;   REFRACTIVE SURGERY Right    TEMPORARY PACEMAKER INSERTION Bilateral 02/10/2012   Procedure: TEMPORARY PACEMAKER INSERTION;  Surgeon: Avanell Leigh, MD;  Location: [facility] CATH LAB;  Service: Cardiovascular;  Laterality: Bilateral;   US  ECHOCARDIOGRAPHY  01/10/10   LV systolic fx mod to severely reduced, EF 35-40%, AOV mildly sclerotic   Family History Family History  Problem Relation Age of Onset   Cancer Father     Social History Social History   Tobacco Use   Smoking status: Former    Current packs/day: 0.00    Average packs/day: 15.0 packs/day for 36.0 years (540.0 ttl pk-yrs)    Types: Cigars, Cigarettes    Start date: 05/25/1969    Quit date: 05/25/2005    Years since quitting: 18.7   Smokeless tobacco: Never   Tobacco comments:    15 cigars/daily when smoking  Substance Use Topics   Alcohol use: Yes    Alcohol/week: 0.0 standard drinks of alcohol    Comment: "quit alcohol in 1985 or 1986"   Drug use: No   Allergies Pollen extract  Review of Systems Review of Systems  All other  systems reviewed and are negative.   Physical Exam Vital Signs  I have reviewed the triage vital signs BP 130/62   Pulse 82   Temp 97.7 F (36.5 C) (Oral)   Resp 19   Ht 5' 9.5" (1.765 m)   Wt 70.3 kg   SpO2 97%   BMI 22.56 kg/m  Physical Exam Vitals and nursing note reviewed.  Constitutional:      General: He is not in acute distress.    Appearance: Normal appearance.  HENT:     Mouth/Throat:     Mouth: Mucous membranes are dry.  Eyes:     Conjunctiva/sclera: Conjunctivae normal.  Cardiovascular:     Rate and Rhythm: Normal rate and regular rhythm.  Pulmonary:     Effort: Pulmonary effort is normal. No respiratory distress.     Breath sounds: Normal breath sounds.  Abdominal:     General: Abdomen is flat.     Palpations: Abdomen is soft.     Tenderness: There is no abdominal tenderness.  Musculoskeletal:     Right lower leg: No edema.     Left lower leg: No edema.  Skin:    General: Skin is warm and dry.     Capillary Refill: Capillary refill takes less than 2 seconds.  Neurological:     Mental Status: He is alert and oriented to person, place, and time. Mental status is at baseline.  Psychiatric:        Mood and Affect: Mood normal.        Behavior: Behavior normal.     ED Results and Treatments Labs (all labs ordered are listed, but only abnormal results are displayed) Labs Reviewed  BASIC METABOLIC PANEL WITH GFR -  Abnormal; Notable for the following components:      Result Value   Glucose, Bld 240 (*)    BUN 27 (*)    Anion gap 17 (*)    All other components within normal limits  CBC - Abnormal; Notable for the following components:   WBC 12.0 (*)    All other components within normal limits  URINALYSIS, W/ REFLEX TO CULTURE (INFECTION SUSPECTED) - Abnormal; Notable for the following components:   Glucose, UA >=500 (*)    Bacteria, UA RARE (*)    All other components within normal limits  TROPONIN T, HIGH SENSITIVITY - Abnormal; Notable for the  following components:   Troponin T High Sensitivity 19 (*)    All other components within normal limits  TROPONIN T, HIGH SENSITIVITY                                                                                                                          Radiology DG Chest Portable 1 View Result Date: 03/04/2024 CLINICAL DATA:  Syncope. EXAM: PORTABLE CHEST 1 VIEW COMPARISON:  Remote radiograph 05/21/2014 FINDINGS: Left-sided pacemaker in place. Post median sternotomy. Normal heart size with stable mediastinal contours. Interstitial coarsening suggestive emphysema. Bronchial thickening. No focal airspace disease, large pleural effusion or pneumothorax. No acute osseous findings. IMPRESSION: Emphysema and bronchial thickening.  No acute findings. Electronically Signed   By: Chadwick Colonel M.D.   On: 03/04/2024 19:29    Pertinent labs & imaging results that were available during my care of the patient were reviewed by me and considered in my medical decision making (see MDM for details).  Medications Ordered in ED Medications  lactated ringers bolus 1,000 mL (0 mLs Intravenous Stopped 03/04/24 1835)  lactated ringers bolus 1,000 mL (0 mLs Intravenous Stopped 03/04/24 1947)                                                                                                                                     Procedures Procedures  (including critical care time)  Medical Decision Making / ED Course   MDM:  78 year old presenting to the emergency department with near syncopal episode.  Patient overall well-appearing, vitals notable for mild hypotension.  ECG shows paced rhythm.  Patient appears mildly dehydrated.  Suspect most likely causes orthostatic given reported positive orthostatic vital signs with EMS, patient appears volume down.  Less likely cardiac  cause, patient denies chest pain, did report some tightness and shortness of breath, will check troponin x 2, initial troponin only  borderline elevated.  ECG with no signs of acute ST changes from prior ECG.  No fever to suggest other underlying infectious cause of hypotension.  No anemia, rectal bleeding abdominal pain or other symptoms to suggest acute blood loss.  Will reassess.  If patient responds to fluids and feels better with normal attention, pacemaker interrogation normal, could potentially be discharged versus admit for observation.  Clinical Course as of 03/04/24 2040  Sat Mar 04, 2024  2038 Blood pressure significantly improved after receiving IV fluids.  Patient reports he feels much better.  His pacemaker was interrogated with no evidence of any arrhythmia or bradycardia arrhythmia today.  His initial troponin was borderline elevated and the second 1 is normal.  Advise close follow-up with his primary physician.  He was able to ambulate independently and feels much better. Will discharge patient to home. All questions answered. Patient comfortable with plan of discharge. Return precautions discussed with patient and specified on the after visit summary.  [WS]    Clinical Course User Index [WS] Isaiah Marc, Dozier Genre, MD     Additional history obtained: -Additional history obtained from family -External records from outside source obtained and reviewed including: Chart review including previous notes, labs, imaging, consultation notes including prior notes    Lab Tests: -I ordered, reviewed, and interpreted labs.   The pertinent results include:   Labs Reviewed  BASIC METABOLIC PANEL WITH GFR - Abnormal; Notable for the following components:      Result Value   Glucose, Bld 240 (*)    BUN 27 (*)    Anion gap 17 (*)    All other components within normal limits  CBC - Abnormal; Notable for the following components:   WBC 12.0 (*)    All other components within normal limits  URINALYSIS, W/ REFLEX TO CULTURE (INFECTION SUSPECTED) - Abnormal; Notable for the following components:   Glucose, UA >=500 (*)     Bacteria, UA RARE (*)    All other components within normal limits  TROPONIN T, HIGH SENSITIVITY - Abnormal; Notable for the following components:   Troponin T High Sensitivity 19 (*)    All other components within normal limits  TROPONIN T, HIGH SENSITIVITY    Notable for AKI, mild anion gap likely due to dehydration   EKG  Paced rhythm     Imaging Studies ordered: I ordered imaging studies including CXR On my interpretation imaging demonstrates no acute process I independently visualized and interpreted imaging. I agree with the radiologist interpretation   Medicines ordered and prescription drug management: Meds ordered this encounter  Medications   lactated ringers bolus 1,000 mL   lactated ringers bolus 1,000 mL    -I have reviewed the patients home medicines and have made adjustments as needed   Reevaluation: After the interventions noted above, I reevaluated the patient and found that their symptoms have resolved  Co morbidities that complicate the patient evaluation  Past Medical History:  Diagnosis Date   Atrial tachycardia (HCC)    Cardiomyopathy (HCC)    mixed ischemic and nonischemic   CHB (complete heart block) (HCC) 02/11/12   Medtronic permanent pacemaker   CHF (congestive heart failure) (HCC)    COPD (chronic obstructive pulmonary disease) (HCC)    Coronary artery disease    DOE (dyspnea on exertion)    NYHA class 2-3   DVT (deep  venous thrombosis) (HCC) 12/2005   RLE "had to have OR"   Dyslipidemia    GERD (gastroesophageal reflux disease)    Hypertension    LBBB (left bundle branch block)    Myocardial infarction (HCC)    "I've had 6" (01/03/2014)   Psoriasis    Sleep apnea    c pap at home, does not use   Type II diabetes mellitus (HCC)       Dispostion: Disposition decision including need for hospitalization was considered, and patient discharged from emergency department.    Final Clinical Impression(s) / ED Diagnoses Final  diagnoses:  Near syncope  Dehydration     This chart was dictated using voice recognition software.  Despite best efforts to proofread,  errors can occur which can change the documentation meaning.    Mordecai Applebaum, MD 03/04/24 2040

## 2024-03-04 NOTE — ED Notes (Signed)
 [employer] returned call.  Only received a partial transmission.  Re interrogated patient's pacemaker with another successful sent message.  Called [employer] back to be sure they received.

## 2024-03-04 NOTE — ED Notes (Signed)
 Called [employer] to inquire about interrogation report.  Was told someone (a rep) would return my call.

## 2024-03-08 DIAGNOSIS — I739 Peripheral vascular disease, unspecified: Secondary | ICD-10-CM | POA: Diagnosis not present

## 2024-03-08 DIAGNOSIS — E7800 Pure hypercholesterolemia, unspecified: Secondary | ICD-10-CM | POA: Diagnosis not present

## 2024-03-08 DIAGNOSIS — R55 Syncope and collapse: Secondary | ICD-10-CM | POA: Diagnosis not present

## 2024-03-08 DIAGNOSIS — I10 Essential (primary) hypertension: Secondary | ICD-10-CM | POA: Diagnosis not present

## 2024-03-08 DIAGNOSIS — I5042 Chronic combined systolic (congestive) and diastolic (congestive) heart failure: Secondary | ICD-10-CM | POA: Diagnosis not present

## 2024-03-08 DIAGNOSIS — Z950 Presence of cardiac pacemaker: Secondary | ICD-10-CM | POA: Diagnosis not present

## 2024-03-08 DIAGNOSIS — E1142 Type 2 diabetes mellitus with diabetic polyneuropathy: Secondary | ICD-10-CM | POA: Diagnosis not present

## 2024-03-08 DIAGNOSIS — G4733 Obstructive sleep apnea (adult) (pediatric): Secondary | ICD-10-CM | POA: Diagnosis not present

## 2024-03-08 DIAGNOSIS — J449 Chronic obstructive pulmonary disease, unspecified: Secondary | ICD-10-CM | POA: Diagnosis not present

## 2024-03-08 DIAGNOSIS — I2510 Atherosclerotic heart disease of native coronary artery without angina pectoris: Secondary | ICD-10-CM | POA: Diagnosis not present

## 2024-03-17 NOTE — Progress Notes (Signed)
 Remote pacemaker transmission.

## 2024-03-21 ENCOUNTER — Telehealth (HOSPITAL_COMMUNITY): Payer: Self-pay | Admitting: *Deleted

## 2024-03-21 NOTE — Telephone Encounter (Signed)
 Attempted to call patient regarding upcoming cardiac PET appointment. Voicemail box not set up.  Chase Copping RN Navigator Cardiac Imaging [facility] [phone] Office [phone] Cell

## 2024-03-22 ENCOUNTER — Ambulatory Visit (HOSPITAL_COMMUNITY): Admission: RE | Admit: 2024-03-22 | Source: Ambulatory Visit

## 2024-04-24 ENCOUNTER — Telehealth (HOSPITAL_COMMUNITY): Payer: Self-pay | Admitting: *Deleted

## 2024-04-24 NOTE — Telephone Encounter (Signed)
 Reaching out to patient to offer assistance regarding upcoming cardiac imaging study; pt verbalizes understanding of appt date/time, parking situation and where to check in, pre-test NPO status; name and call back number provided for further questions should they arise  Chantal Requena RN Navigator Cardiac Imaging [facility] [phone] office [phone] cell  Patient aware to avoid caffeine and Imdur  prior to his cardiac PET scan.

## 2024-04-25 ENCOUNTER — Ambulatory Visit (HOSPITAL_COMMUNITY)
Admission: RE | Admit: 2024-04-25 | Discharge: 2024-04-25 | Disposition: A | Discharge status: Home / Self Care | Source: Ambulatory Visit | Attending: Cardiovascular Disease | Admitting: Cardiovascular Disease

## 2024-04-25 DIAGNOSIS — R0602 Shortness of breath: Secondary | ICD-10-CM

## 2024-04-25 DIAGNOSIS — R0789 Other chest pain: Secondary | ICD-10-CM | POA: Diagnosis not present

## 2024-04-25 LAB — NM PET CT CARDIAC PERFUSION MULTI W/ABSOLUTE BLOODFLOW
LV dias vol: 98 mL (ref 62–150)
LV sys vol: 42 mL (ref 4.2–5.8)
MBFR: 1.9
Nuc Rest EF: 57 %
Nuc Stress EF: 57 %
Peak HR: 85 {beats}/min
Rest HR: 63 {beats}/min
Rest MBF: 0.9 ml/g/min
Rest Nuclear Isotope Dose: 18.2 mCi
SDS: 15
SRS: 1
SSS: 17
ST Depression (mm): 0 mm
Stress MBF: 1.71 ml/g/min
Stress Nuclear Isotope Dose: 18.1 mCi

## 2024-04-25 MED ORDER — RUBIDIUM RB82 GENERATOR (RUBYFILL)
18.1200 | PACK | Freq: Once | INTRAVENOUS | Status: AC
  Administered 2024-04-25: 18.12 via INTRAVENOUS

## 2024-04-25 MED ORDER — REGADENOSON 0.4 MG/5ML IV SOLN
0.4000 mg | Freq: Once | INTRAVENOUS | Status: AC
  Administered 2024-04-25: 0.4 mg via INTRAVENOUS

## 2024-04-25 MED ORDER — REGADENOSON 0.4 MG/5ML IV SOLN
INTRAVENOUS | Status: AC
Start: 2024-04-25 — End: 2024-04-25
  Filled 2024-04-25: qty 5

## 2024-04-25 MED ORDER — RUBIDIUM RB82 GENERATOR (RUBYFILL)
18.1600 | PACK | Freq: Once | INTRAVENOUS | Status: AC
  Administered 2024-04-25: 18.16 via INTRAVENOUS

## 2024-04-25 NOTE — Progress Notes (Signed)
 Pt. Tolerated lexi scan well.

## 2024-04-26 ENCOUNTER — Ambulatory Visit: Payer: Self-pay | Admitting: Cardiovascular Disease

## 2024-04-26 NOTE — Telephone Encounter (Signed)
 Croitoru, Mihai, MD to Me (Selected Message)    04/26/24 12:08 PM Result Note The PET scan does show a moderate area of poor blood flow in the sidewall of the heart, probably in the territory of the same bypass graft where he received stents in the past.  Recommend cardiac catheterization.  Can we please bring him in first available appointment (APP okay) to discuss and schedule the procedure?  Went over the results above and scheduled Appt with APP 05/03/24. Dr Francyne went over the results and recommendations with the patient as well.   He verbalized understanding.

## 2024-05-01 NOTE — Progress Notes (Signed)
 Cardiology Office Note    Patient Name: [name] Date of Encounter: 05/01/2024  Primary Care Provider:  Rexanne Ingle, MD Primary Cardiologist:  Jerel Balding, MD Primary Electrophysiologist: None   Past Medical History    Past Medical History:  Diagnosis Date   Atrial tachycardia (HCC)    Cardiomyopathy (HCC)    mixed ischemic and nonischemic   CHB (complete heart block) (HCC) 02/11/12   Medtronic permanent pacemaker   CHF (congestive heart failure) (HCC)    COPD (chronic obstructive pulmonary disease) (HCC)    Coronary artery disease    DOE (dyspnea on exertion)    NYHA class 2-3   DVT (deep venous thrombosis) (HCC) 12/2005   RLE had to have OR   Dyslipidemia    GERD (gastroesophageal reflux disease)    Hypertension    LBBB (left bundle branch block)    Myocardial infarction (HCC)    I've had 6 (01/03/2014)   Psoriasis    Sleep apnea    c pap at home, does not use   Type II diabetes mellitus (HCC)     History of Present Illness  [name] is a 78 y.o. male with a PMH of CAD s/p CABG 2007 DM type II, ICM, HFrEF, OSA, PAF, LBBB, HTN, HLD, CHB s/p PPM 2015 and upgrade to CRT-P, PAD, AAA, for pre-LHC visit.  [name] was last seen by Dr. Balding on 03/01/2024 with complaint of chest pain and throat tightness reminiscent of previous angina.  He reported resolution of symptoms with nitroglycerin .  He underwent PET stress test that showed reversible defect in the territory of bypass grafts with recommendation of left heart cath.  [name] presents today for preheart cath visit. He has been experiencing fluctuating blood pressure readings at home, with values ranging from low to as high as 160-170/80. A recent episode of dehydration on Mar 04, 2024, led to hospitalization where his blood pressure was recorded at 80/40. He received two bags of IV fluids, which temporarily normalized his blood pressure. He continues to monitor his fluid intake, drinking well water and  diluted Gatorade, and reports consuming about two to three glasses of water daily. He experiences significant shortness of breath, which has not improved since his last visit. Physical activity, such as walking up stairs, exacerbates his symptoms. He denies chest pain and has COPD and quit smoking on March 26, 2005, after his first heart attack, following an 18-year smoking history. He has a history of neuropathy, initially managed with Gabapentin , which was effective for a few weeks but subsequently lost efficacy. He reports no current issues with swelling in his feet. Patient denies palpitations,PND, orthopnea, nausea, vomiting, dizziness, syncope, edema, weight gain, or early satiety.   Discussed the use of AI scribe software for clinical note transcription with the patient, who gave verbal consent to proceed.  History of Present Illness   Review of Systems  Please see the history of present illness.    All other systems reviewed and are otherwise negative except as noted above.  Physical Exam    Wt Readings from Last 3 Encounters:  03/04/24 155 lb (70.3 kg)  03/01/24 155 lb (70.3 kg)  11/18/22 158 lb 9.6 oz (71.9 kg)   CD:Uyzmz were no vitals filed for this visit.,There is no height or weight on file to calculate BMI. GEN: Well nourished, well developed in no acute distress Neck: No JVD; No carotid bruits Pulmonary: Clear to auscultation without rales, wheezing or rhonchi  Cardiovascular:  Normal rate. Regular rhythm. Normal S1. Normal S2.   Murmurs: There is no murmur.  ABDOMEN: Soft, non-tender, non-distended EXTREMITIES:  No edema; No deformity   EKG/LABS/ Recent Cardiac Studies   ECG personally reviewed by me today -atrial sensed and V paced with rate of 71 bpm and no acute changes  Risk Assessment/Calculations:       Lab Results  Component Value Date   WBC 12.0 (H) 03/04/2024   HGB 15.2 03/04/2024   HCT 44.7 03/04/2024   MCV 96.8 03/04/2024   PLT 324 03/04/2024   Lab  Results  Component Value Date   CREATININE 1.22 03/04/2024   BUN 27 (H) 03/04/2024   NA 140 03/04/2024   K 4.8 03/04/2024   CL 99 03/04/2024   CO2 24 03/04/2024   Lab Results  Component Value Date   CHOL 135 10/16/2011   HDL 34 (L) 10/16/2011   LDLCALC 64 10/16/2011   TRIG 187 (H) 10/16/2011   CHOLHDL 4.0 10/16/2011    Lab Results  Component Value Date   HGBA1C 10.4 (H) 02/11/2012   Assessment & Plan    Assessment & Plan  1.  History of CAD: -Persistent shortness of breath likely due to coronary artery disease or COPD. Stress test indicated decreased blood flow in the heart's sidewall, suggesting possible blockage. - Schedule heart catheterization to assess for blockage and collateral flow. - Hold Jardiance  three days prior to procedure. - Check CBC and BMET today - Continue current GDMT with Plavix  75 mg, ASA 81 mg, Imdur  60 mg, metoprolol  100 mg twice daily, Crestor  10 mg daily and as needed Nitrostat  0.4 mg  2.  HFrEF/ICM: - Patient's last 2D echo completed in 2023 showing EF of 55 to 60% with grade 1 DD - Patient is euvolemic on examination today but endorses shortness of breath with exertion. - Continue Lasix  20 mg as needed, Imdur  60 mg daily, benazepril  5 mg daily and Jardiance  10 mg daily -Low sodium diet, fluid restriction <2L, and daily weights encouraged. Educated to contact our office for weight gain of 2 lbs overnight or 5 lbs in one week.   3.  CHB: -s/p Medtronic CRT-P implanted 07/2014 with Paceart report showing normal lead function and battery life - EKG shows atrial sensed and V paced  4.  Essential HTN: - Patient's blood pressure today was initially low at 98/50 and was 111/58 on recheck - Continue Lotensin  5 mg and Imdur  60 mg daily  5.  Hyperlipidemia: - Patient's LDL cholesterol was 70 at goal - Continue Crestor  10 mg daily  6.  History of COPD  -COPD with persistent shortness of breath, possibly related to coronary artery disease. - Proceed  with heart catheterization to evaluate cardiac contribution to symptoms.     Disposition: Follow-up with Jerel Balding, MD or APP in 3-4 weeks Informed Consent   Shared Decision Making/Informed Consent The risks [stroke (1 in 1000), death (1 in 1000), kidney failure [usually temporary] (1 in 500), bleeding (1 in 200), allergic reaction [possibly serious] (1 in 200)], benefits (diagnostic support and management of coronary artery disease) and alternatives of a cardiac catheterization were discussed in detail with Mr. Kirn and he is willing to proceed.      Signed, Wyn Raddle, Jackee Shove, NP 05/01/2024, 11:27 AM [facility]

## 2024-05-01 NOTE — H&P (View-Only) (Signed)
 Cardiology Office Note    Patient Name: [name] Date of Encounter: 05/01/2024  Primary Care Provider:  Rexanne Ingle, MD Primary Cardiologist:  Jerel Balding, MD Primary Electrophysiologist: None   Past Medical History    Past Medical History:  Diagnosis Date   Atrial tachycardia (HCC)    Cardiomyopathy (HCC)    mixed ischemic and nonischemic   CHB (complete heart block) (HCC) 02/11/12   Medtronic permanent pacemaker   CHF (congestive heart failure) (HCC)    COPD (chronic obstructive pulmonary disease) (HCC)    Coronary artery disease    DOE (dyspnea on exertion)    NYHA class 2-3   DVT (deep venous thrombosis) (HCC) 12/2005   RLE had to have OR   Dyslipidemia    GERD (gastroesophageal reflux disease)    Hypertension    LBBB (left bundle branch block)    Myocardial infarction (HCC)    I've had 6 (01/03/2014)   Psoriasis    Sleep apnea    c pap at home, does not use   Type II diabetes mellitus (HCC)     History of Present Illness  [name] is a 78 y.o. male with a PMH of CAD s/p CABG 2007 DM type II, ICM, HFrEF, OSA, PAF, LBBB, HTN, HLD, CHB s/p PPM 2015 and upgrade to CRT-P, PAD, AAA, for pre-LHC visit.  [name] was last seen by Dr. Balding on 03/01/2024 with complaint of chest pain and throat tightness reminiscent of previous angina.  He reported resolution of symptoms with nitroglycerin .  He underwent PET stress test that showed reversible defect in the territory of bypass grafts with recommendation of left heart cath.  [name] presents today for preheart cath visit. He has been experiencing fluctuating blood pressure readings at home, with values ranging from low to as high as 160-170/80. A recent episode of dehydration on Mar 04, 2024, led to hospitalization where his blood pressure was recorded at 80/40. He received two bags of IV fluids, which temporarily normalized his blood pressure. He continues to monitor his fluid intake, drinking well water and  diluted Gatorade, and reports consuming about two to three glasses of water daily. He experiences significant shortness of breath, which has not improved since his last visit. Physical activity, such as walking up stairs, exacerbates his symptoms. He denies chest pain and has COPD and quit smoking on March 26, 2005, after his first heart attack, following an 18-year smoking history. He has a history of neuropathy, initially managed with Gabapentin , which was effective for a few weeks but subsequently lost efficacy. He reports no current issues with swelling in his feet. Patient denies palpitations,PND, orthopnea, nausea, vomiting, dizziness, syncope, edema, weight gain, or early satiety.   Discussed the use of AI scribe software for clinical note transcription with the patient, who gave verbal consent to proceed.  History of Present Illness   Review of Systems  Please see the history of present illness.    All other systems reviewed and are otherwise negative except as noted above.  Physical Exam    Wt Readings from Last 3 Encounters:  03/04/24 155 lb (70.3 kg)  03/01/24 155 lb (70.3 kg)  11/18/22 158 lb 9.6 oz (71.9 kg)   CD:Uyzmz were no vitals filed for this visit.,There is no height or weight on file to calculate BMI. GEN: Well nourished, well developed in no acute distress Neck: No JVD; No carotid bruits Pulmonary: Clear to auscultation without rales, wheezing or rhonchi  Cardiovascular:  Normal rate. Regular rhythm. Normal S1. Normal S2.   Murmurs: There is no murmur.  ABDOMEN: Soft, non-tender, non-distended EXTREMITIES:  No edema; No deformity   EKG/LABS/ Recent Cardiac Studies   ECG personally reviewed by me today -atrial sensed and V paced with rate of 71 bpm and no acute changes  Risk Assessment/Calculations:       Lab Results  Component Value Date   WBC 12.0 (H) 03/04/2024   HGB 15.2 03/04/2024   HCT 44.7 03/04/2024   MCV 96.8 03/04/2024   PLT 324 03/04/2024   Lab  Results  Component Value Date   CREATININE 1.22 03/04/2024   BUN 27 (H) 03/04/2024   NA 140 03/04/2024   K 4.8 03/04/2024   CL 99 03/04/2024   CO2 24 03/04/2024   Lab Results  Component Value Date   CHOL 135 10/16/2011   HDL 34 (L) 10/16/2011   LDLCALC 64 10/16/2011   TRIG 187 (H) 10/16/2011   CHOLHDL 4.0 10/16/2011    Lab Results  Component Value Date   HGBA1C 10.4 (H) 02/11/2012   Assessment & Plan    Assessment & Plan  1.  History of CAD: -Persistent shortness of breath likely due to coronary artery disease or COPD. Stress test indicated decreased blood flow in the heart's sidewall, suggesting possible blockage. - Schedule heart catheterization to assess for blockage and collateral flow. - Hold Jardiance  three days prior to procedure. - Check CBC and BMET today - Continue current GDMT with Plavix  75 mg, ASA 81 mg, Imdur  60 mg, metoprolol  100 mg twice daily, Crestor  10 mg daily and as needed Nitrostat  0.4 mg  2.  HFrEF/ICM: - Patient's last 2D echo completed in 2023 showing EF of 55 to 60% with grade 1 DD - Patient is euvolemic on examination today but endorses shortness of breath with exertion. - Continue Lasix  20 mg as needed, Imdur  60 mg daily, benazepril  5 mg daily and Jardiance  10 mg daily -Low sodium diet, fluid restriction <2L, and daily weights encouraged. Educated to contact our office for weight gain of 2 lbs overnight or 5 lbs in one week.   3.  CHB: -s/p Medtronic CRT-P implanted 07/2014 with Paceart report showing normal lead function and battery life - EKG shows atrial sensed and V paced  4.  Essential HTN: - Patient's blood pressure today was initially low at 98/50 and was 111/58 on recheck - Continue Lotensin  5 mg and Imdur  60 mg daily  5.  Hyperlipidemia: - Patient's LDL cholesterol was 70 at goal - Continue Crestor  10 mg daily  6.  History of COPD  -COPD with persistent shortness of breath, possibly related to coronary artery disease. - Proceed  with heart catheterization to evaluate cardiac contribution to symptoms.     Disposition: Follow-up with Jerel Balding, MD or APP in 3-4 weeks Informed Consent   Shared Decision Making/Informed Consent The risks [stroke (1 in 1000), death (1 in 1000), kidney failure [usually temporary] (1 in 500), bleeding (1 in 200), allergic reaction [possibly serious] (1 in 200)], benefits (diagnostic support and management of coronary artery disease) and alternatives of a cardiac catheterization were discussed in detail with Mr. Kirn and he is willing to proceed.      Signed, Wyn Raddle, Jackee Shove, NP 05/01/2024, 11:27 AM [facility]

## 2024-05-03 ENCOUNTER — Ambulatory Visit: Attending: Nurse Practitioner | Admitting: Nurse Practitioner

## 2024-05-03 VITALS — BP 111/58 | HR 68 | Ht 70.0 in | Wt 154.0 lb

## 2024-05-03 DIAGNOSIS — J449 Chronic obstructive pulmonary disease, unspecified: Secondary | ICD-10-CM

## 2024-05-03 DIAGNOSIS — I5042 Chronic combined systolic (congestive) and diastolic (congestive) heart failure: Secondary | ICD-10-CM | POA: Diagnosis not present

## 2024-05-03 DIAGNOSIS — E782 Mixed hyperlipidemia: Secondary | ICD-10-CM | POA: Diagnosis not present

## 2024-05-03 DIAGNOSIS — I25708 Atherosclerosis of coronary artery bypass graft(s), unspecified, with other forms of angina pectoris: Secondary | ICD-10-CM | POA: Diagnosis not present

## 2024-05-03 DIAGNOSIS — I442 Atrioventricular block, complete: Secondary | ICD-10-CM | POA: Diagnosis not present

## 2024-05-03 DIAGNOSIS — I10 Essential (primary) hypertension: Secondary | ICD-10-CM

## 2024-05-03 NOTE — Patient Instructions (Addendum)
 Medication Instructions:  Your physician recommends that you continue on your current medications as directed. Please refer to the Current Medication list given to you today.  *If you need a refill on your cardiac medications before your next appointment, please call your pharmacy*   Lab Work: TODAY-BMET & CBC If you have labs (blood work) drawn today and your tests are completely normal, you will receive your results only by: MyChart Message (if you have MyChart) OR A paper copy in the mail If you have any lab test that is abnormal or we need to change your treatment, we will call you to review the results.    Testing/Procedures: Your physician has requested that you have a cardiac catheterization. Cardiac catheterization is used to diagnose and/or treat various heart conditions. Doctors may recommend this procedure for a number of different reasons. The most common reason is to evaluate chest pain. Chest pain can be a symptom of coronary artery disease (CAD), and cardiac catheterization can show whether plaque is narrowing or blocking your heart's arteries. This procedure is also used to evaluate the valves, as well as measure the blood flow and oxygen levels in different parts of your heart. For further information please visit [email or web address] Please follow instruction sheet, as given.    Follow-Up: At [facility], you and your health needs are our priority.  As part of our continuing mission to provide you with exceptional heart care, our providers are all part of one team.  This team includes your primary Cardiologist (physician) and Advanced Practice Providers or APPs (Physician Assistants and Nurse Practitioners) who all work together to provide you with the care you need, when you need it.  Your next appointment:   3-4 week(s)  Provider:   Jerel Balding, MD      Other Instructions        Cardiac Catheterization   You are scheduled for a Cardiac  Catheterization on Tuesday, July 15 with Dr. Gordy Bergamo.  1. Please arrive at the [facility] (Main Entrance A) at [facility]: [address] [place], [place] [address] at 10:00 AM (This time is 2 hour(s) before your procedure to ensure your preparation).   Free valet parking service is available. You will check in at ADMITTING. The support person will be asked to wait in the waiting room.  It is OK to have someone drop you off and come back when you are ready to be discharged.        Special note: Every effort is made to have your procedure done on time. Please understand that emergencies sometimes delay scheduled procedures.  2. Diet: Do not eat solid foods after midnight.  You may have clear liquids until 5 AM the day of the procedure.  3. Labs: You will need to have blood drawn on Wednesday, July 9 at [facility] (1st Floor), [address], [place], [place] [address]. You do not need to be fasting.  4. Medication instructions in preparation for your procedure:   Contrast Allergy: No    Stop taking, Lasix  (Furosemide )  Tuesday, July 15,   Do not take Diabetes Med Glucophage  (Metformin ) on the day of the procedure and HOLD 48 HOURS AFTER THE PROCEDURE.  HOLD GLIPIZIDE  THE MORNING OF YOUR CARDIAC CATH (7/15)  HOLD JARDIANCE  THE MORNING OF YOUR CARDIA CATH (7/15)  On the morning of your procedure, take Aspirin  81 mg and Plavix /Clopidogrel  and any morning medicines NOT listed  above.  You may use sips of water.  5. Plan to go home the same day, you will only stay overnight if medically necessary. 6. You MUST have a responsible adult to drive you home. 7. An adult MUST be with you the first 24 hours after you arrive home. 8. Bring a current list of your medications, and the last time and date medication taken. 9. Bring ID and current insurance cards. 10.Please wear clothes that are easy to get on and off and wear slip-on  shoes.  Thank you for allowing us  to care for you!   -- [facility]

## 2024-05-04 ENCOUNTER — Ambulatory Visit: Payer: Medicare Other

## 2024-05-04 ENCOUNTER — Ambulatory Visit: Payer: Self-pay | Admitting: Internal Medicine

## 2024-05-04 ENCOUNTER — Ambulatory Visit: Payer: Self-pay | Admitting: Nurse Practitioner

## 2024-05-04 DIAGNOSIS — I442 Atrioventricular block, complete: Secondary | ICD-10-CM | POA: Diagnosis not present

## 2024-05-04 LAB — CUP PACEART REMOTE DEVICE CHECK
Battery Remaining Longevity: 84 mo
Battery Voltage: 2.99 V
Brady Statistic AP VP Percent: 16.71 %
Brady Statistic AP VS Percent: 0 %
Brady Statistic AS VP Percent: 83.27 %
Brady Statistic AS VS Percent: 0.02 %
Brady Statistic RA Percent Paced: 16.38 %
Brady Statistic RV Percent Paced: 99.98 %
Date Time Interrogation Session: 20250709234620
Implantable Lead Connection Status: 753985
Implantable Lead Connection Status: 753985
Implantable Lead Connection Status: 753985
Implantable Lead Implant Date: 20130418
Implantable Lead Implant Date: 20130418
Implantable Lead Implant Date: 20150312
Implantable Lead Location: 753858
Implantable Lead Location: 753859
Implantable Lead Location: 753860
Implantable Pulse Generator Implant Date: 20231002
Lead Channel Impedance Value: 323 Ohm
Lead Channel Impedance Value: 3344 Ohm
Lead Channel Impedance Value: 3344 Ohm
Lead Channel Impedance Value: 3344 Ohm
Lead Channel Impedance Value: 418 Ohm
Lead Channel Impedance Value: 494 Ohm
Lead Channel Impedance Value: 532 Ohm
Lead Channel Impedance Value: 551 Ohm
Lead Channel Impedance Value: 608 Ohm
Lead Channel Pacing Threshold Amplitude: 0.75 V
Lead Channel Pacing Threshold Pulse Width: 0.4 ms
Lead Channel Sensing Intrinsic Amplitude: 2.625 mV
Lead Channel Sensing Intrinsic Amplitude: 2.625 mV
Lead Channel Sensing Intrinsic Amplitude: 24.625 mV
Lead Channel Sensing Intrinsic Amplitude: 24.625 mV
Lead Channel Setting Pacing Amplitude: 2 V
Lead Channel Setting Pacing Amplitude: 2 V
Lead Channel Setting Pacing Amplitude: 2.5 V
Lead Channel Setting Pacing Pulse Width: 0.4 ms
Lead Channel Setting Pacing Pulse Width: 0.8 ms
Lead Channel Setting Sensing Sensitivity: 5.6 mV
Zone Setting Status: 755011
Zone Setting Status: 755011

## 2024-05-04 LAB — BASIC METABOLIC PANEL WITH GFR
BUN/Creatinine Ratio: 25 — AB (ref 10–24)
BUN: 20 mg/dL (ref 8–27)
CO2: 19 mmol/L — AB (ref 20–29)
Calcium: 10.2 mg/dL (ref 8.6–10.2)
Chloride: 99 mmol/L (ref 96–106)
Creatinine, Ser: 0.81 mg/dL (ref 0.76–1.27)
Glucose: 131 mg/dL — AB (ref 70–99)
Potassium: 4.7 mmol/L (ref 3.5–5.2)
Sodium: 142 mmol/L (ref 134–144)
eGFR: 91 mL/min/1.73 (ref >59)

## 2024-05-04 LAB — CBC
Hematocrit: 48.9 % (ref 37.5–51.0)
Hemoglobin: 15.6 g/dL (ref 13.0–17.7)
MCH: 31.5 pg (ref 26.6–33.0)
MCHC: 31.9 g/dL (ref 31.5–35.7)
MCV: 99 fL — ABNORMAL HIGH (ref 79–97)
Platelets: 341 x10E3/uL (ref 150–450)
RBC: 4.96 x10E6/uL (ref 4.14–5.80)
RDW: 12.4 % (ref 11.6–15.4)
WBC: 8 x10E3/uL (ref 3.4–10.8)

## 2024-05-08 ENCOUNTER — Telehealth: Payer: Self-pay | Admitting: *Deleted

## 2024-05-08 NOTE — Telephone Encounter (Signed)
 Cardiac Catheterization scheduled at [facility] for: Tuesday May 09, 2024 12 Noon Arrival time [facility] Main Entrance A at: 10 AM  Nothing to eat after midnight prior to procedure, clear liquids until 5 AM day of procedure.  Medication instructions: -Hold:  Metformin -day of procedure and 48 hours post procedure  Glipizide /Jardiance /Lasix -AM of procedure -Other usual morning medications can be taken with sips of water including aspirin  81 mg and Plavix  75 mg.  Plan to go home the same day, you will only stay overnight if medically necessary.  You must have responsible adult to drive you home.  Someone must be with you the first 24 hours after you arrive home.  Reviewed procedure instructions with patient.

## 2024-05-09 ENCOUNTER — Other Ambulatory Visit: Payer: Self-pay

## 2024-05-09 ENCOUNTER — Ambulatory Visit (HOSPITAL_COMMUNITY)
Admission: RE | Admit: 2024-05-09 | Discharge: 2024-05-09 | Disposition: A | Discharge status: Home / Self Care | Attending: Cardiology | Admitting: Cardiology

## 2024-05-09 ENCOUNTER — Encounter (HOSPITAL_COMMUNITY): Payer: Self-pay | Admitting: Cardiology

## 2024-05-09 ENCOUNTER — Ambulatory Visit: Payer: Self-pay | Admitting: Cardiovascular Disease

## 2024-05-09 ENCOUNTER — Encounter (HOSPITAL_COMMUNITY)
Admission: RE | Disposition: A | Discharge status: Home / Self Care | Payer: Self-pay | Source: Home / Self Care | Attending: Cardiology

## 2024-05-09 DIAGNOSIS — Z79899 Other long term (current) drug therapy: Secondary | ICD-10-CM | POA: Diagnosis not present

## 2024-05-09 DIAGNOSIS — E785 Hyperlipidemia, unspecified: Secondary | ICD-10-CM | POA: Insufficient documentation

## 2024-05-09 DIAGNOSIS — Z950 Presence of cardiac pacemaker: Secondary | ICD-10-CM | POA: Insufficient documentation

## 2024-05-09 DIAGNOSIS — Z7902 Long term (current) use of antithrombotics/antiplatelets: Secondary | ICD-10-CM | POA: Insufficient documentation

## 2024-05-09 DIAGNOSIS — I110 Hypertensive heart disease with heart failure: Secondary | ICD-10-CM | POA: Diagnosis not present

## 2024-05-09 DIAGNOSIS — Z951 Presence of aortocoronary bypass graft: Secondary | ICD-10-CM | POA: Insufficient documentation

## 2024-05-09 DIAGNOSIS — I2510 Atherosclerotic heart disease of native coronary artery without angina pectoris: Secondary | ICD-10-CM | POA: Insufficient documentation

## 2024-05-09 DIAGNOSIS — R9439 Abnormal result of other cardiovascular function study: Secondary | ICD-10-CM | POA: Insufficient documentation

## 2024-05-09 DIAGNOSIS — I25810 Atherosclerosis of coronary artery bypass graft(s) without angina pectoris: Secondary | ICD-10-CM | POA: Diagnosis not present

## 2024-05-09 DIAGNOSIS — Z87891 Personal history of nicotine dependence: Secondary | ICD-10-CM | POA: Insufficient documentation

## 2024-05-09 DIAGNOSIS — I25708 Atherosclerosis of coronary artery bypass graft(s), unspecified, with other forms of angina pectoris: Secondary | ICD-10-CM

## 2024-05-09 DIAGNOSIS — I5022 Chronic systolic (congestive) heart failure: Secondary | ICD-10-CM | POA: Diagnosis not present

## 2024-05-09 HISTORY — PX: LEFT HEART CATH AND CORS/GRAFTS ANGIOGRAPHY: CATH118250

## 2024-05-09 HISTORY — PX: CORONARY STENT INTERVENTION: CATH118234

## 2024-05-09 HISTORY — PX: CORONARY PRESSURE/FFR STUDY: CATH118243

## 2024-05-09 LAB — GLUCOSE, CAPILLARY: Glucose-Capillary: 200 mg/dL — ABNORMAL HIGH (ref 70–99)

## 2024-05-09 LAB — POCT ACTIVATED CLOTTING TIME
Activated Clotting Time: 262 s
Activated Clotting Time: 273 s
Activated Clotting Time: 291 s

## 2024-05-09 SURGERY — LEFT HEART CATH AND CORS/GRAFTS ANGIOGRAPHY
Anesthesia: LOCAL

## 2024-05-09 MED ORDER — METFORMIN HCL 1000 MG PO TABS: 1000.0000 mg | ORAL_TABLET | Freq: Two times a day (BID) | ORAL | Status: AC

## 2024-05-09 MED ORDER — HEPARIN SODIUM (PORCINE) 1000 UNIT/ML IJ SOLN
INTRAMUSCULAR | Status: AC
  Filled 2024-05-09: qty 10

## 2024-05-09 MED ORDER — SODIUM CHLORIDE 0.9% FLUSH: 3.0000 mL | Freq: Two times a day (BID) | INTRAVENOUS | Status: DC

## 2024-05-09 MED ORDER — SODIUM CHLORIDE 0.9 % IV SOLN
INTRAVENOUS | Status: DC | PRN
  Administered 2024-05-09: 10 mL/h via INTRAVENOUS

## 2024-05-09 MED ORDER — SODIUM CHLORIDE 0.9 % IV SOLN: 250.0000 mL | INTRAVENOUS | Status: DC | PRN

## 2024-05-09 MED ORDER — ASPIRIN 81 MG PO CHEW: 81.0000 mg | CHEWABLE_TABLET | ORAL | Status: DC

## 2024-05-09 MED ORDER — LIDOCAINE HCL (PF) 1 % IJ SOLN
INTRAMUSCULAR | Status: DC | PRN
  Administered 2024-05-09: 2 mL via INTRADERMAL

## 2024-05-09 MED ORDER — HEPARIN SODIUM (PORCINE) 1000 UNIT/ML IJ SOLN
INTRAMUSCULAR | Status: DC | PRN
Start: 2024-05-09 — End: 2024-05-10
  Administered 2024-05-09: 3000 [IU] via INTRAVENOUS
  Administered 2024-05-09: 5000 [IU] via INTRAVENOUS
  Administered 2024-05-09: 2000 [IU] via INTRAVENOUS
  Administered 2024-05-09: 5000 [IU] via INTRAVENOUS

## 2024-05-09 MED ORDER — ACETAMINOPHEN 325 MG PO TABS
650.0000 mg | ORAL_TABLET | ORAL | Status: DC | PRN
Start: 2024-05-09 — End: 2024-05-10

## 2024-05-09 MED ORDER — FENTANYL CITRATE (PF) 100 MCG/2ML IJ SOLN
INTRAMUSCULAR | Status: DC | PRN
  Administered 2024-05-09: 25 ug via INTRAVENOUS

## 2024-05-09 MED ORDER — SODIUM CHLORIDE 0.9 % WEIGHT BASED INFUSION: 3.0000 mL/kg/h | INTRAVENOUS | Status: AC

## 2024-05-09 MED ORDER — ONDANSETRON HCL 4 MG/2ML IJ SOLN: 4.0000 mg | Freq: Four times a day (QID) | INTRAMUSCULAR | Status: DC | PRN

## 2024-05-09 MED ORDER — IOHEXOL 350 MG/ML SOLN
INTRAVENOUS | Status: DC | PRN
Start: 2024-05-09 — End: 2024-05-10
  Administered 2024-05-09: 110 mL

## 2024-05-09 MED ORDER — LIDOCAINE HCL (PF) 1 % IJ SOLN
INTRAMUSCULAR | Status: AC
Start: 2024-05-09 — End: 2024-05-09
  Filled 2024-05-09: qty 30

## 2024-05-09 MED ORDER — SODIUM CHLORIDE 0.9 % WEIGHT BASED INFUSION: 1.0000 mL/kg/h | INTRAVENOUS | Status: DC

## 2024-05-09 MED ORDER — VERAPAMIL HCL 2.5 MG/ML IV SOLN
INTRAVENOUS | Status: AC
Start: 2024-05-09 — End: 2024-05-09
  Filled 2024-05-09: qty 2

## 2024-05-09 MED ORDER — FENTANYL CITRATE (PF) 100 MCG/2ML IJ SOLN
INTRAMUSCULAR | Status: AC
  Filled 2024-05-09: qty 2

## 2024-05-09 MED ORDER — MIDAZOLAM HCL 2 MG/2ML IJ SOLN
INTRAMUSCULAR | Status: AC
  Filled 2024-05-09: qty 2

## 2024-05-09 MED ORDER — MIDAZOLAM HCL 2 MG/2ML IJ SOLN
INTRAMUSCULAR | Status: DC | PRN
  Administered 2024-05-09: 2 mg via INTRAVENOUS

## 2024-05-09 MED ORDER — HEPARIN (PORCINE) IN NACL 2-0.9 UNITS/ML
INTRAMUSCULAR | Status: DC | PRN
  Administered 2024-05-09: 10 mL via INTRA_ARTERIAL

## 2024-05-09 MED ORDER — SODIUM CHLORIDE 0.9% FLUSH: 3.0000 mL | INTRAVENOUS | Status: DC | PRN

## 2024-05-09 MED ORDER — HEPARIN (PORCINE) IN NACL 1000-0.9 UT/500ML-% IV SOLN
INTRAVENOUS | Status: DC | PRN
Start: 2024-05-09 — End: 2024-05-10
  Administered 2024-05-09: 1000 mL
  Administered 2024-05-09: 500 mL

## 2024-05-09 SURGICAL SUPPLY — 21 items
BALLOON EMERGE MR 2.5X15 (BALLOONS) IMPLANT
CATH GUIDELINER COAST (CATHETERS) IMPLANT
CATH INFINITI 5FR AL1 (CATHETERS) IMPLANT
CATH INFINITI AMBI 5FR TG (CATHETERS) IMPLANT
CATH INFINITI JR4 5F (CATHETERS) IMPLANT
CATH LAUNCHER 6FR AL.75 (CATHETERS) IMPLANT
CATH LAUNCHER 6FR AR1 (CATHETERS) IMPLANT
CATH VISTA GUIDE 6FR XB3.5 EPK (CATHETERS) IMPLANT
CATHETER LAUNCHER 6FR LCB (CATHETERS) IMPLANT
DEVICE RAD COMP TR BAND LRG (VASCULAR PRODUCTS) IMPLANT
DEVICE SPIDERFX EMB PROT 3MM (WIRE) IMPLANT
ELECT DEFIB PAD ADLT CADENCE (PAD) IMPLANT
GLIDESHEATH SLEND A-KIT 6F 22G (SHEATH) IMPLANT
GUIDEWIRE INQWIRE 1.5J.035X260 (WIRE) IMPLANT
GUIDEWIRE PRESSURE X 175 (WIRE) IMPLANT
KIT ENCORE 26 ADVANTAGE (KITS) IMPLANT
KIT HEMO VALVE WATCHDOG (MISCELLANEOUS) IMPLANT
PACK CARDIAC CATHETERIZATION (CUSTOM PROCEDURE TRAY) ×1 IMPLANT
SET ATX-X65L (MISCELLANEOUS) IMPLANT
STENT SYNERGY XD 3.50X16 (Permanent Stent) IMPLANT
WIRE RUNTHROUGH .014X180CM (WIRE) IMPLANT

## 2024-05-09 NOTE — Discharge Summary (Signed)
 Discharge Summary for Same Day PCI   Patient ID: [name] MRN: [id number]; DOB: 07-08-1946  Admit date: 05/09/2024 Discharge date: 05/09/2024  Primary Care Provider: Rexanne Ingle, MD  Primary Cardiologist: Jerel Balding, MD  Primary Electrophysiologist:  None   Discharge Diagnoses    Active Problems:   CAD (coronary artery disease) of bypass graft, CABG 2007, cath Feb 2011    Diagnostic Studies/Procedures    Cardiac Catheterization 05/09/2024: Cardiac Catheterization 05/09/24: Hemodynamic data: LVEDP 12 mmHg.  There is no pressure gradient across aortic valve.   Angiographic data: LM: Large-caliber vessel, smooth and normal. LAD: Is a large-caliber vessel with mild disease. LCx: Is a large-caliber vessel and a dominant vessel.  Gives origin to large OM1 and OM 2 which are occluded in the proximal segment at the site of prior stents.  Distal CX is large giving origin to PDA branches.  Mid segment of the CX has a 60% stenosis in some views appears to be 70%. RCA: Nondominant.  Distal RCA has a 90% focal stenosis. SVG to OM1 and OM 2: Widely patent in the proximal segment however at the insertion of OM1 there is a high-grade 99% stenosis.   Intervention data: Successful PTCA and stenting of the SVG to OM1-OM 2 placing the stent into the distal SVG and OM1 branch, a 3.5 x 16 mm Synergy XD DES deployed at 20 atmospheric pressure.  Stenosis reduced from 99% to 0% with TIMI-3 to TIMI-3 flow.   RFR to mid CX 60 to 70% stenosis: 0.99, not hemodynamically significant.  Lesion left alone.      Impression and recommendations: Continue DAPT for 6 months followed by probably Plavix  alone.  Will be discharged home today with outpatient follow-up.    _____________   History of Present Illness     [name] is a 78 y.o. male with a past medical history of CAD s/p CABG in 2007, type 2 diabetes, ICM, HFrEF, PAF, LBBB, HTN, HLD, CHB s/p PPM in 2015 with later upgrade to CRT, PAD,  AAA.  Patient had been seen by Dr. Balding on 03/01/24 for evaluation of chest pain weakness.  This reminded him of his anginal symptoms.  He underwent cardiac PET on 04/25/2024 that showed findings consistent with ischemia in the left circumflex artery distribution (probably restenosis in the sequential SVG-OM1-OM.  Study was intermediate risk. Cardiac catheterization was arranged for further evaluation.  Hospital Course     The patient underwent cardiac cath as noted above with Dr. Ladona. Plan for DAPT with ASA/Plavix  for at least 6 months. The patient was seen by cardiac rehab while in short stay. There were no observed complications post cath. Radial cath site was re-evaluated prior to discharge and found to be stable without any complications. Instructions/precautions regarding cath site care were given prior to discharge.  [name] was seen by Dr. Ladona and determined stable for discharge home. Follow up with our office has been arranged. Medications are listed below. No changes were made.   He has follow up with general cardiology on 7/31     _____________  Cath/PCI Registry Performance & Quality Measures: Aspirin  prescribed? - Yes ADP Receptor Inhibitor (Plavix /Clopidogrel , Brilinta/Ticagrelor or Effient/Prasugrel) prescribed (includes medically managed patients)? - Yes High Intensity Statin (Lipitor 40-80mg  or Crestor  20-40mg ) prescribed? - No - cholesterol at goal on crestor  10 mg daily For EF <40%, was ACEI/ARB prescribed? - Not Applicable (EF >/= 40%) For EF <40%, Aldosterone Antagonist (Spironolactone or Eplerenone) prescribed? -  Not Applicable (EF >/= 40%) Cardiac Rehab Phase II ordered (Included Medically managed Patients)? - Yes  _____________   Discharge Vitals Blood pressure 128/66, pulse 64, temperature 97.6 F (36.4 C), temperature source Oral, resp. rate 18, height 5' 9.5 (1.765 m), weight 70.3 kg, SpO2 96%.  Filed Weights   05/09/24 1107  Weight: 70.3 kg     Last Labs & Radiologic Studies    CBC No results for input(s): WBC, NEUTROABS, HGB, HCT, MCV, PLT in the last 72 hours. Basic Metabolic Panel No results for input(s): NA, K, CL, CO2, GLUCOSE, BUN, CREATININE, CALCIUM , MG, PHOS in the last 72 hours. Liver Function Tests No results for input(s): AST, ALT, ALKPHOS, BILITOT, PROT, ALBUMIN in the last 72 hours. No results for input(s): LIPASE, AMYLASE in the last 72 hours. High Sensitivity Troponin:   No results for input(s): TROPONINIHS in the last 720 hours.  BNP Invalid input(s): POCBNP D-Dimer No results for input(s): DDIMER in the last 72 hours. Hemoglobin A1C No results for input(s): HGBA1C in the last 72 hours. Fasting Lipid Panel No results for input(s): CHOL, HDL, LDLCALC, TRIG, CHOLHDL, LDLDIRECT in the last 72 hours. Thyroid  Function Tests No results for input(s): TSH, T4TOTAL, T3FREE, THYROIDAB in the last 72 hours.  Invalid input(s): FREET3 _____________  CUP PACEART REMOTE DEVICE CHECK Result Date: 05/04/2024 Pacemaker:  Scheduled remote reviewed. Normal device function.  Presenting rhythm: AS/BiVP 1 NSVT, V>A, 11 beats, V-rate 153 bpm Next remote 91 days. ML, CVRS  NM PET CT CARDIAC PERFUSION MULTI W/ABSOLUTE BLOODFLOW Result Date: 04/25/2024   Findings are consistent with ischemia in the left circumflex artery distribution (probably restenosis in the sequential SCG to OM1-OM2). The study is intermediate risk.   LV perfusion is abnormal. There is evidence of ischemia. There is no evidence of infarction. Defect 1: There is a medium defect with moderate reduction in uptake present in the apical to basal inferolateral location(s) that is reversible. There is normal wall motion in the defect area. Consistent with ischemia.   Rest left ventricular function is normal. Rest EF: 57%. Stress left ventricular function is normal. Stress EF: 57%. End  diastolic cavity size is normal. End systolic cavity size is normal.   Myocardial blood flow was computed to be 0.85ml/g/min at rest and 1.71ml/g/min at stress. Global myocardial blood flow reserve was 1.90 and was mildly abnormal. In the left circumflex territory the flow reserve is markedly abnormal at 1.52.   Coronary calcium  assessment not performed due to prior revascularization.   Electronically signed by Jerel Balding, MD EXAM: OVER-READ INTERPRETATION  CT CHEST The following report is a limited chest CT over-read performed by radiologist Dr. Elsie Ko [facility] on 04/25/2024. This over-read does not include interpretation of cardiac or coronary anatomy or pathology nor does it include evaluation of the PET data. The cardiac PET-CT interpretation by the cardiologist is attached. COMPARISON:  Chest radiographs 03/04/2024.  Chest CT 12/02/2009. FINDINGS: Mediastinum/Nodes: No enlarged lymph nodes within the visualized mediastinum.Status post median sternotomy and CABG. Diffuse atherosclerosis of the aorta, great vessels and coronary arteries. Pacemaker leads project into the right atrium and right ventricle. Lungs/Pleura: No pleural effusion or pneumothorax. Mild centrilobular emphysema. No suspicious pulmonary nodularity. Upper abdomen: No significant findings in the visualized upper abdomen. Musculoskeletal/Chest wall: No chest wall mass or suspicious osseous findings within the visualized chest. IMPRESSION: No acute or significant extracardiac findings. Aortic Atherosclerosis (ICD10-I70.0) and Emphysema (ICD10-J43.9). Electronically Signed   By: Elsie Perone M.D.   On:  04/25/2024 14:25   Disposition   Pt is being discharged home today in good condition.  Follow-up Plans & Appointments     Discharge Instructions     AMB Referral to Cardiac Rehabilitation - Phase II   Complete by: As directed    Diagnosis:  PTCA Stable Angina Coronary Stents     After initial  evaluation and assessments completed: Virtual Based Care may be provided alone or in conjunction with Phase 2 Cardiac Rehab based on patient barriers.: Yes   Intensive Cardiac Rehabilitation (ICR) [facility] location only OR Traditional Cardiac Rehabilitation (TCR) *If criteria for ICR are not met will enroll in TCR [facility] only): Yes        Discharge Medications   Allergies as of 05/09/2024       Reactions   Pollen Extract Other (See Comments)   runny nose        Medication List     TAKE these medications    aspirin  EC 81 MG tablet Take 81 mg by mouth daily.   benazepril  5 MG tablet Commonly known as: LOTENSIN  Take 5 mg by mouth daily.   clopidogrel  75 MG tablet Commonly known as: PLAVIX  TAKE 1 TABLET BY MOUTH  DAILY   empagliflozin  10 MG Tabs tablet Commonly known as: Jardiance  Take 1 tablet (10 mg total) by mouth daily before breakfast.   furosemide  20 MG tablet Commonly known as: LASIX  Take 2 tablets (40 mg total) by mouth daily as needed for fluid or edema (for shortness of  breath and swelling. Please take for weight greater than 166lb). MAY TAKE 40mg   DAILY AS NEEDED FOR SWELLING/SHORTNESS OF BREATH and WEIGHT OVER 166lbs   glipiZIDE  5 MG 24 hr tablet Commonly known as: GLUCOTROL  XL Take 5 mg by mouth in the morning and at bedtime.   isosorbide  mononitrate 60 MG 24 hr tablet Commonly known as: IMDUR  Take 1 tablet (60 mg total) by mouth daily.   metFORMIN  1000 MG tablet Commonly known as: GLUCOPHAGE  Take 1 tablet (1,000 mg total) by mouth 2 (two) times daily with a meal. HOLD for 2 days. Restart 03/10/2014. Start taking on: May 11, 2024 What changed: These instructions start on May 11, 2024. If you are unsure what to do until then, ask your doctor or other care provider.   metoprolol  tartrate 100 MG tablet Commonly known as: LOPRESSOR  TAKE 1 TABLET BY MOUTH TWO  TIMES DAILY   nitroGLYCERIN  0.4 MG SL tablet Commonly known as: NITROSTAT  Place 1 tablet (0.4 mg  total) under the tongue every 5 (five) minutes as needed for chest pain. max 3 tablets per event. call 911 if no relief   pantoprazole  40 MG tablet Commonly known as: PROTONIX  Take 40 mg by mouth daily as needed (acid reflux).   rosuvastatin  10 MG tablet Commonly known as: CRESTOR  TAKE 1 TABLET BY MOUTH  DAILY           Allergies Allergies  Allergen Reactions   Pollen Extract Other (See Comments)    runny nose    Outstanding Labs/Studies    Duration of Discharge Encounter   Greater than 30 minutes including physician time.  Signed, Rollo FABIENE Louder, PA-C 05/09/2024, 3:13 PM

## 2024-05-09 NOTE — Interval H&P Note (Signed)
 History and Physical Interval Note:  05/09/2024 1:21 PM  [name]  has presented today for surgery, with the diagnosis of abnormal pet - cad.  The various methods of treatment have been discussed with the patient and family. After consideration of risks, benefits and other options for treatment, the patient has consented to  Procedure(s): LEFT HEART CATH AND CORS/GRAFTS ANGIOGRAPHY (N/A) and possible coronary angioplasty for class III angina pectoris with  moderate risk nuclear stress test hence proceeding with peripheral arteriogram and possible angioplasty as a surgical intervention.  The patient's history has been reviewed, patient examined, no change in status, stable for surgery.  I have reviewed the patient's chart and labs.  Questions were answered to the patient's satisfaction.     Gordy Bergamo

## 2024-05-09 NOTE — Discharge Instructions (Signed)

## 2024-05-09 NOTE — Progress Notes (Signed)
 Reviewed post cath wound care, activity restrictions, and s/s to report to MD. Stressed medication compliance, especially the DAPT (Asa/Plavix ). Reviewed heart healthy and low Na diet, provided handouts. Stressed daily weights with h/o CHF. Reviewed S/S of CHF. Reviewed activity guidelines and use of NTG. Pt will be referred to [facility] CRP2 program. Pt has stent card, encouraged to copy and carry. Pt verbalized understanding of the education provided.   Alm Parkins MS, ACSM-CEP, CCRP

## 2024-05-25 ENCOUNTER — Ambulatory Visit: Admitting: Cardiovascular Disease

## 2024-05-25 ENCOUNTER — Encounter: Payer: Self-pay | Admitting: Cardiovascular Disease

## 2024-05-25 VITALS — BP 120/56 | HR 66 | Ht 69.5 in | Wt 153.0 lb

## 2024-05-25 DIAGNOSIS — J449 Chronic obstructive pulmonary disease, unspecified: Secondary | ICD-10-CM

## 2024-05-25 DIAGNOSIS — I5032 Chronic diastolic (congestive) heart failure: Secondary | ICD-10-CM | POA: Diagnosis not present

## 2024-05-25 DIAGNOSIS — E1151 Type 2 diabetes mellitus with diabetic peripheral angiopathy without gangrene: Secondary | ICD-10-CM | POA: Diagnosis not present

## 2024-05-25 DIAGNOSIS — I4719 Other supraventricular tachycardia: Secondary | ICD-10-CM

## 2024-05-25 DIAGNOSIS — I10 Essential (primary) hypertension: Secondary | ICD-10-CM

## 2024-05-25 DIAGNOSIS — I442 Atrioventricular block, complete: Secondary | ICD-10-CM

## 2024-05-25 DIAGNOSIS — I25708 Atherosclerosis of coronary artery bypass graft(s), unspecified, with other forms of angina pectoris: Secondary | ICD-10-CM

## 2024-05-25 DIAGNOSIS — Z950 Presence of cardiac pacemaker: Secondary | ICD-10-CM

## 2024-05-25 DIAGNOSIS — E782 Mixed hyperlipidemia: Secondary | ICD-10-CM

## 2024-05-25 NOTE — Progress Notes (Signed)
 Patient ID: [name], male   DOB: 1946-10-04, 78 y.o.   MRN: [id number]    Cardiology Office Note    Date:  05/25/2024   ID:  [name], DOB [birth year], MRN [id number]  PCP:  Rexanne Ingle, MD  Cardiologist:  Danelle Birmingham, MD:  Jerel Balding, MD   No chief complaint on file.   History of Present Illness:  [name] is a 78 y.o. male with CAD (multiple PCI before CABG 2007; SVG-OM1-OM2 and SVG-PLA), ischemic cardiomyopathy, chronic systolic and diastolic heart failure, complete heart block, frequent sustained paroxysmal atrial tachycardia and a biventricular pacemaker (new generator August 04, 2022 Medtronic Percepta CRT-P, original leads 2015). He also has advanced chronic obstructive lung disease related to long-standing previous smoking, type 2 diabetes mellitus on oral antidiabetics, small AAA (3 cm by US  09/2017), moderate bilateral PAD of lower extremities.  He returns in follow-up after undergoing left heart catheterization and percutaneous revascularization of a 99% stenosis in the SVG to OM1-OM2 with placement of a drug-eluting stent (3.5 x 16 mm Synergy XD) on 05/09/2024.  He has not had any angina pectoris (chest pressure and throat tightness) since undergoing the stent procedure.  He felt great immediately after the procedure.  About a week later he noticed a little bit of shortness of breath and has begun to notice some diaphragmatic stimulation from his pacemaker, occasionally.  His blood pressure has been low frequently.  He has not had any problems at the right radial artery access site.  He has not taken any furosemide  recently.  He has had occasional orthostatic dizziness, but has not experienced syncope.  Diaphragmatic stimulation was not obvious during pacemaker check today and may be positional.  Device function is otherwise normal.  OptiVol has been in normal range.  He has 15.6% atrial pacing and 100% biventricular pacing.  There has been no atrial fibrillation or  high ventricular rate.  His coronary sinus lead is programmed to tip to can.  Estimated generator longevity 6.7 years.  All other lead parameters are excellent.  We did not explore reprogramming of the LV lead today.  His most recent echocardiogram performed in November 2023 shows normal LVEF 55 to 60%, impaired relaxation pattern of the mitral inflow and mildly abnormal global longitudinal strain at -16.5%.  There are no serious valve abnormalities.  He last underwent evaluation for his small abdominal aortic aneurysm in January 2022 when it measured 3.1 cm, essentially unchanged from previous evaluation.  He also has stable moderate bilateral iliac stenoses that appear to be asymptomatic.  We have decided to perform ultrasound follow-up less frequently since the findings have been so stable.  Metabolic parameters look good with a hemoglobin A1c of 7.4% and LDL cholesterol 50, HDL 38.  He has normal renal function.  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  Enjoys his grandson.    He has a long-standing history of coronary disease and underwent bypass surgery in 2007. He had left bundle branch block for years and in 2013 received a dual-chamber permanent pacemaker for complete heart block Just before getting his pacemaker he underwent cardiac catheterization which showed a patent sequential saphenous vein graft bypass to the first and second oblique marginal arteries (both of these vessels had previously been stented and had severe in-stent restenosis).   He has no underlying escape rhythm and is 100% paced. He requires  relatively high doses of beta blockers for control of recurrent paroxysmal atrial tachycardia which was quite symptomatic. Left ventricular ejection fraction had been 45-50% prior to pacemaker implantation. Following implantation of his pacemaker his echocardiogram showed moderately depressed left  ventricular systolic function with an ejection fraction of 35-40%. He improved following biventricular pacemaker upgrade, but then had lead dislodgment and deteriorated again. After repositioning of his left ventricular lead, left ventricular ejection fraction increased to 55%. Attempts at increased ACE inhibitor in the past led to severe symptomatic hypotension.  May 2017 had lower extremity arterial Dopplers showing  >50% right external and left common iliac artery stenosis. 30-49% right SFA disease, without focal stenosis. 50-74% left distal SFA stenosis. Occluded bilateral anterior tibial artery disease, two vessel run-off, bilaterally. Bilateral ABI 1.1-1.26 September 2017 AAA 3.0 cm. September 2020 AAA 3.0 cm January 2022 AAA 3.1 cm Right ABI 1.21, left ABI 0.80   Past Medical History:  Diagnosis Date   Atrial tachycardia (HCC)    Cardiomyopathy (HCC)    mixed ischemic and nonischemic   CHB (complete heart block) (HCC) 02/11/12   Medtronic permanent pacemaker   CHF (congestive heart failure) (HCC)    COPD (chronic obstructive pulmonary disease) (HCC)    Coronary artery disease    DOE (dyspnea on exertion)    NYHA class 2-3   DVT (deep venous thrombosis) (HCC) 12/2005   RLE had to have OR   Dyslipidemia    GERD (gastroesophageal reflux disease)    Hypertension    LBBB (left bundle branch block)    Myocardial infarction (HCC)    I've had 6 (01/03/2014)   Psoriasis    Sleep apnea    c pap at home, does not use   Type II diabetes mellitus (HCC)     Past Surgical History:  Procedure Laterality Date   BI-VENTRICULAR PACEMAKER REVISION N/A 03/06/2014   Procedure: BI-VENTRICULAR PACEMAKER REVISION (CRT-R);  Surgeon: Danelle LELON Birmingham, MD;  Location: [facility] CATH LAB;  Service: Cardiovascular;  Laterality: N/A;   BIV PACEMAKER GENERATOR CHANGEOUT N/A 07/27/2022   Procedure: BIV PACEMAKER GENERATOR CHANGEOUT;  Surgeon: Birmingham Danelle LELON, MD;  Location: [facility] INVASIVE CV LAB;  Service:  Cardiovascular;  Laterality: N/A;   CARDIAC CATHETERIZATION  02/10/12   left CX: dominant w/40% prox. AV groove stenosis.The first & second marginal branches had stents visualized near the ostium of the vessel bu were functionally occluded. The remainder of the dominant CX had minor irregularities.  RCA: nondominant free of sign. disease. SVG to OM1 & OM2 sequentially was widely patent.   CATARACT EXTRACTION Left    CORONARY ANGIOPLASTY WITH STENT PLACEMENT     I had 9 stents before the [facility] in 2007 (01/03/2014)   CORONARY ARTERY BYPASS GRAFT  2007   x 3   CORONARY PRESSURE/FFR STUDY N/A 05/09/2024   Procedure: CORONARY PRESSURE/FFR STUDY;  Surgeon: Ladona Heinz, MD;  Location: [facility] INVASIVE CV LAB;  Service: Cardiovascular;  Laterality: N/A;   CORONARY STENT INTERVENTION N/A 05/09/2024   Procedure: CORONARY STENT INTERVENTION;  Surgeon: Ladona Heinz, MD;  Location: [facility] INVASIVE CV LAB;  Service: Cardiovascular;  Laterality: N/A;   ELECTROPHYSIOLOGY STUDY  01/03/2014   EPS by Dr Birmingham with no inducible ventricular arrhythmias   ELECTROPHYSIOLOGY STUDY N/A 01/03/2014   Procedure: ELECTROPHYSIOLOGY STUDY;  Surgeon: Danelle LELON Birmingham, MD;  Location: [facility] CATH LAB;  Service: Cardiovascular;  Laterality: N/A;   LEAD REVISION  03-06-2014   LV lead revision by Dr Birmingham   LEFT HEART  CATH AND CORS/GRAFTS ANGIOGRAPHY N/A 05/09/2024   Procedure: LEFT HEART CATH AND CORS/GRAFTS ANGIOGRAPHY;  Surgeon: Ladona Heinz, MD;  Location: [facility] INVASIVE CV LAB;  Service: Cardiovascular;  Laterality: N/A;   LEFT HEART CATHETERIZATION WITH CORONARY/GRAFT ANGIOGRAM  02/10/2012   Procedure: LEFT HEART CATHETERIZATION WITH EL BILE;  Surgeon: Dorn JINNY Lesches, MD;  Location: [facility] CATH LAB;  Service: Cardiovascular;;   NM MYOCAR PERF WALL MOTION  10/02/2009   no significant ischemia   PACEMAKER INSERTION  2013; 01/03/2014   MDT dual chamber pacemaker implanted 2013 for complete heart block; upgrade to CRTP (MDT) by Dr Waddell  12/2013 due to cardiomyopathy and heart failure   PERMANENT PACEMAKER INSERTION N/A 02/11/2012   Procedure: PERMANENT PACEMAKER INSERTION;  Surgeon: Jerel Balding, MD;  Location: [facility] CATH LAB;  Service: Cardiovascular;  Laterality: N/A;   REFRACTIVE SURGERY Right    TEMPORARY PACEMAKER INSERTION Bilateral 02/10/2012   Procedure: TEMPORARY PACEMAKER INSERTION;  Surgeon: Dorn JINNY Lesches, MD;  Location: [facility] CATH LAB;  Service: Cardiovascular;  Laterality: Bilateral;   US  ECHOCARDIOGRAPHY  01/10/10   LV systolic fx mod to severely reduced, EF 35-40%, AOV mildly sclerotic    Current Medications: Outpatient Medications Prior to Visit  Medication Sig Dispense Refill   aspirin  EC 81 MG tablet Take 81 mg by mouth daily.     clopidogrel  (PLAVIX ) 75 MG tablet TAKE 1 TABLET BY MOUTH  DAILY 90 tablet 2   empagliflozin  (JARDIANCE ) 10 MG TABS tablet Take 1 tablet (10 mg total) by mouth daily before breakfast. 30 tablet 11   glipiZIDE  (GLUCOTROL  XL) 5 MG 24 hr tablet Take 5 mg by mouth in the morning and at bedtime.     metFORMIN  (GLUCOPHAGE ) 1000 MG tablet Take 1 tablet (1,000 mg total) by mouth 2 (two) times daily with a meal. HOLD for 2 days. Restart 03/10/2014.     metoprolol  tartrate (LOPRESSOR ) 100 MG tablet TAKE 1 TABLET BY MOUTH TWO  TIMES DAILY 180 tablet 2   rosuvastatin  (CRESTOR ) 10 MG tablet TAKE 1 TABLET BY MOUTH  DAILY 90 tablet 3   benazepril  (LOTENSIN ) 5 MG tablet Take 5 mg by mouth daily.     isosorbide  mononitrate (IMDUR ) 60 MG 24 hr tablet Take 1 tablet (60 mg total) by mouth daily. 90 tablet 3   furosemide  (LASIX ) 20 MG tablet Take 2 tablets (40 mg total) by mouth daily as needed for fluid or edema (for shortness of  breath and swelling. Please take for weight greater than 166lb). MAY TAKE 40mg   DAILY AS NEEDED FOR SWELLING/SHORTNESS OF BREATH and WEIGHT OVER 166lbs (Patient not taking: Reported on 05/25/2024) 30 tablet 3   nitroGLYCERIN  (NITROSTAT ) 0.4 MG SL tablet Place 1 tablet (0.4 mg total)  under the tongue every 5 (five) minutes as needed for chest pain. max 3 tablets per event. call 911 if no relief (Patient not taking: Reported on 05/25/2024) 25 tablet 6   pantoprazole  (PROTONIX ) 40 MG tablet Take 40 mg by mouth daily as needed (acid reflux).  (Patient not taking: Reported on 05/25/2024)     No facility-administered medications prior to visit.     Allergies:   Pollen extract   Family History:  The patient's family history includes Cancer in his father.   ROS:   Please see the history of present illness.    ROS All other systems are reviewed and are negative.  PHYSICAL EXAM:   VS:  BP (!) 120/56 (BP Location: Left Arm, Patient Position: Sitting, Cuff Size:  Normal)   Pulse 66   Ht 5' 9.5 (1.765 m)   Wt 153 lb (69.4 kg)   SpO2 97%   BMI 22.27 kg/m       General: Alert, oriented x3, no distress, healthy left subclavian pacemaker site Head: no evidence of trauma, PERRL, EOMI, no exophtalmos or lid lag, no myxedema, no xanthelasma; normal ears, nose and oropharynx Neck: normal jugular venous pulsations and no hepatojugular reflux; brisk carotid pulses without delay and no carotid bruits Chest: clear to auscultation, no signs of consolidation by percussion or palpation, normal fremitus, symmetrical and full respiratory excursions Cardiovascular: normal position and quality of the apical impulse, regular rhythm, normal first and second heart sounds, no murmurs, rubs or gallops Abdomen: no tenderness or distention, no masses by palpation, no abnormal pulsatility or arterial bruits, normal bowel sounds, no hepatosplenomegaly Extremities: no clubbing, cyanosis or edema; 2+ radial, ulnar and brachial pulses bilaterally; 2+ right femoral, posterior tibial and dorsalis pedis pulses; 2+ left femoral, posterior tibial and dorsalis pedis pulses; no subclavian or femoral bruits Neurological: grossly nonfocal Psych: Normal mood and affect    Wt Readings from Last 3 Encounters:   05/25/24 153 lb (69.4 kg)  05/09/24 155 lb (70.3 kg)  05/03/24 154 lb (69.9 kg)      Studies/Labs Reviewed:   Cardiac catheterization/percutaneous intervention 05/09/2024:   Cardiac Catheterization 05/09/24: Hemodynamic data: LVEDP 12 mmHg.  There is no pressure gradient across aortic valve.   Angiographic data: LM: Large-caliber vessel, smooth and normal. LAD: Is a large-caliber vessel with mild disease. LCx: Is a large-caliber vessel and a dominant vessel.  Gives origin to large OM1 and OM 2 which are occluded in the proximal segment at the site of prior stents.  Distal CX is large giving origin to PDA branches.  Mid segment of the CX has a 60% stenosis in some views appears to be 70%. RCA: Nondominant.  Distal RCA has a 90% focal stenosis. SVG to OM1 and OM 2: Widely patent in the proximal segment however at the insertion of OM1 there is a high-grade 99% stenosis.   Intervention data: Successful PTCA and stenting of the SVG to OM1-OM 2 placing the stent into the distal SVG and OM1 branch, a 3.5 x 16 mm Synergy XD DES deployed at 20 atmospheric pressure.  Stenosis reduced from 99% to 0% with TIMI-3 to TIMI-3 flow.   RFR to mid CX 60 to 70% stenosis: 0.99, not hemodynamically significant.  Lesion left alone.        EKG:    EKG Interpretation Date/Time:  Thursday May 25 2024 11:16:12 EDT Ventricular Rate:  66 PR Interval:  162 QRS Duration:  174 QT Interval:  450 QTC Calculation: 471 R Axis:   -44  Text Interpretation: Atrial sensed, Ventricular paced rhythm When compared with ECG of 09-May-2024 15:04, No significant change since last tracing Confirmed by Ronalda Walpole ([id number]) on 05/25/2024 11:26:51 AM        Recent Labs:  09/08/2023 Cholesterol 112, HDL 38, LDL 50, triglycerides 142 Hemoglobin A1c 7.4% Hemoglobin 14.4, potassium 4.4, ALT 15, TSH 1.480  ASSESSMENT:    1. Coronary artery disease involving coronary bypass graft of native heart with other forms  of angina pectoris (HCC)   2. Chronic diastolic heart failure (HCC)   3. Complete heart block (HCC)   4. Essential hypertension   5. PAT (paroxysmal atrial tachycardia) (HCC)   6. Chronic obstructive pulmonary disease, unspecified COPD type (HCC)   7. Biventricular cardiac pacemaker  in situ   8. Mixed hyperlipidemia   9. Diabetes mellitus with peripheral vascular disease (HCC)       PLAN:  In order of problems listed above:  CAD: Developed angina, PET scan showed lateral wall ischemia and a 99% stenosis identified in the sequential SVG to OM1 and OM 2, treated with a drug-eluting stent. Known inferior scar by previous nuclear stress testing, occluded SVG-RCA.  On dual antiplatelet therapy chronically.  On lipid-lowering medications.  No longer has angina.  Will stop his isosorbide  mononitrate. CHF: Normalized LV function after CRT, clinically euvolemic and euvolemic by OptiVol, on Jardiance  and ACE inhibitors (did not tolerate higher doses and would not tolerate Entresto).  Will stop the benazepril .  Consider superimposed cardiac amyloidosis (neuropathy, orthostatic dizziness, diminished e' and s', not quite 5-5-5 sign).  Have considered PYP scan, we will pursue that if his symptoms do not improve after medication adjustment. CHB: Pacemaker dependent. HTN: Hypotensive today.  Stop benazepril  and isosorbide . PAT: Infrequent, burden remains under 1%. He has not had overt atrial fibrillation during many years of monitoring with his pacemaker.  On aspirin  and clopidogrel  for PAD. COPD: Not currently requiring bronchodilators.  He has not smoked since his first episode of myocardial infarction. CRT-P: Normalization of LVEF with biventricular pacing.  Normal device function. HLP: Continue statin, all lipid parameters in target range. DM, complicated by PAD: Adequate control on Jardiance  and metformin  with acceptable hemoglobin A1c, although ideally this would be less than 7%.  Avoid Actos due to  history of heart failure.   PAD: Denies intermittent claudication and must have good collateral formation.  Slightly reduced ABI on the left.  Extensive infrapopliteal disease as well as iliac artery stenoses.   AAA: Infrarenal fusiform dilation of the distal aorta last measured 3.1 cm in January 2022, essentially unchanged over the preceding 5 years.  Plan to reevaluate this next year.    Medication Adjustments/Labs and Tests Ordered: Current medicines are reviewed at length with the patient today.  Concerns regarding medicines are outlined above.  Medication changes, Labs and Tests ordered today are listed in the Patient Instructions below. Patient Instructions  Medication Instructions:  Stop Imdur  Stop Benazepril  *If you need a refill on your cardiac medications before your next appointment, please call your pharmacy*  Lab Work: None ordered If you have labs (blood work) drawn today and your tests are completely normal, you will receive your results only by: MyChart Message (if you have MyChart) OR A paper copy in the mail If you have any lab test that is abnormal or we need to change your treatment, we will call you to review the results.  Testing/Procedures: None ordered  Follow-Up: At [facility], you and your health needs are our priority.  As part of our continuing mission to provide you with exceptional heart care, our providers are all part of one team.  This team includes your primary Cardiologist (physician) and Advanced Practice Providers or APPs (Physician Assistants and Nurse Practitioners) who all work together to provide you with the care you need, when you need it.  Your next appointment:   APP- 6 months  Dr Francyne 1 year  We recommend signing up for the patient portal called MyChart.  Sign up information is provided on this After Visit Summary.  MyChart is used to connect with patients for Virtual Visits (Telemedicine).  Patients are able to view  lab/test results, encounter notes, upcoming appointments, etc.  Non-urgent messages can be sent to your  provider as well.   To learn more about what you can do with MyChart, go to [email or web address].           Signed, Jerel Balding, MD  05/25/2024 2:11 PM    [facility] [address] [place], [place], [place]  [address] Phone: [phone]; Fax: [phone]

## 2024-05-25 NOTE — Patient Instructions (Signed)
 Medication Instructions:  Stop Imdur  Stop Benazepril  *If you need a refill on your cardiac medications before your next appointment, please call your pharmacy*  Lab Work: None ordered If you have labs (blood work) drawn today and your tests are completely normal, you will receive your results only by: MyChart Message (if you have MyChart) OR A paper copy in the mail If you have any lab test that is abnormal or we need to change your treatment, we will call you to review the results.  Testing/Procedures: None ordered  Follow-Up: At [facility], you and your health needs are our priority.  As part of our continuing mission to provide you with exceptional heart care, our providers are all part of one team.  This team includes your primary Cardiologist (physician) and Advanced Practice Providers or APPs (Physician Assistants and Nurse Practitioners) who all work together to provide you with the care you need, when you need it.  Your next appointment:   APP- 6 months  Dr Francyne 1 year  We recommend signing up for the patient portal called MyChart.  Sign up information is provided on this After Visit Summary.  MyChart is used to connect with patients for Virtual Visits (Telemedicine).  Patients are able to view lab/test results, encounter notes, upcoming appointments, etc.  Non-urgent messages can be sent to your provider as well.   To learn more about what you can do with MyChart, go to [email or web address].

## 2024-06-05 ENCOUNTER — Telehealth: Payer: Self-pay | Admitting: Cardiovascular Disease

## 2024-06-05 NOTE — Telephone Encounter (Signed)
   Pt c/o of Chest Pain: STAT if active CP, including tightness, pressure, jaw pain, radiating pain to shoulder/upper arm/back, CP unrelieved by Nitro. Symptoms reported of SOB, nausea, vomiting, sweating.  1. Are you having CP right now? No   2. Are you experiencing any other symptoms (ex. SOB, nausea, vomiting, sweating)? Vomiting   3. Is your CP continuous or coming and going? Come and go   4. Have you taken Nitroglycerin ? Yes - 2 on Saturday   5. How long have you been experiencing CP? Saturday    6. If NO CP at time of call then end call with telling Pt to call back or call 911 if Chest pain returns prior to return call from triage team.  Pt had Heart Cath on 7/15

## 2024-06-05 NOTE — Telephone Encounter (Signed)
 Called and spoke to pt. Per Dr. Wendel (DOD 1), advised pt to restart his Imdur  60 mg once daily. Scheduled OV to see DOD (Dr. Michele) on 06/09/24. (No other APP slots available) Pt will start Imdur  today and keep track of BP's, and bring in the log on 8/15. He was previously on Imdur  30 mg, and it was increased to 60 mg prior to heart Cath, then it was completely discontinued on 05/25/24. For now, advised pt to take 60 mg (most recent dose). No other concerns.

## 2024-06-05 NOTE — Telephone Encounter (Addendum)
 Returned pt's phone call regarding the Chest Pain that occurred on 06/03/2024. 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 He has never in the past had Chest Pain. He also vomited three times with this episode of Chest Pain/Tightness on 06/03/24. He felt a bit short of breath, but he has been SOB since his Heart Cath on 05/09/24. He usually experienced orthostatic dizziness/lightheadedness. He took 2 SL NTG (5 min apart) and it improved the Chest Tightness but it did not completely resolve. He then went to church on Sunday and the CP continued to improve. Today, on Monday 06/05/2024, he does not have any CP/Tightness at all. He only feels weak and legs are sore (from working on the car/break job). Today, his BP=129/63 and HR=64.   ED Precautions reviewed with pt. He verbalized understanding. Advised we will get in touch with him with any further provider recommendations.   Between 3:00 pm and 5:30 pm today, please call son Antwann Preziosi) at [phone]

## 2024-06-05 NOTE — Telephone Encounter (Signed)
 LVM; will call back at 5:30pm today to review/reiterate Dr. Tyree rec's.

## 2024-06-05 NOTE — Telephone Encounter (Signed)
 LVM (detailed) giving Dr. Tyree rec's and ER precautions. 06/05/2024 at 6:00 pm

## 2024-06-05 NOTE — Telephone Encounter (Signed)
 Sure will see him on my DOD day.  However, please make sure he is taking his DAPT due to recent stents and if worsening pain that  increases in intensity, frequency or duration please go to ER.   Adelynn Gipe Orangetree, DO, FACC

## 2024-06-06 NOTE — Telephone Encounter (Signed)
 Spoke with patient and shared response from Dr. Michele:  Sure will see him on my DOD day.  However, please make sure he is taking his DAPT due to recent stents and if worsening pain that  increases in intensity, frequency or duration please go to ER.    Sunit Tolia, DO, [facility]    Patient confirms he is taking all of his medications including ASA and Plavix . Patient verbalized understanding of the above and expressed appreciation for call.

## 2024-06-06 NOTE — Telephone Encounter (Signed)
 Pt returning nurse call

## 2024-06-09 ENCOUNTER — Encounter: Payer: Self-pay | Admitting: Cardiology

## 2024-06-09 ENCOUNTER — Ambulatory Visit: Attending: Cardiology | Admitting: Cardiology

## 2024-06-09 VITALS — BP 134/69 | HR 73 | Ht 69.5 in | Wt 155.0 lb

## 2024-06-09 DIAGNOSIS — I5032 Chronic diastolic (congestive) heart failure: Secondary | ICD-10-CM | POA: Diagnosis not present

## 2024-06-09 DIAGNOSIS — I442 Atrioventricular block, complete: Secondary | ICD-10-CM

## 2024-06-09 DIAGNOSIS — E782 Mixed hyperlipidemia: Secondary | ICD-10-CM | POA: Diagnosis not present

## 2024-06-09 DIAGNOSIS — Z950 Presence of cardiac pacemaker: Secondary | ICD-10-CM

## 2024-06-09 DIAGNOSIS — I25708 Atherosclerosis of coronary artery bypass graft(s), unspecified, with other forms of angina pectoris: Secondary | ICD-10-CM

## 2024-06-09 NOTE — Patient Instructions (Addendum)
 Medication Instructions:  No medication changes were made at this visit. Continue current regimen.   *If you need a refill on your cardiac medications before your next appointment, please call your pharmacy*  Lab Work: None ordered today. If you have labs (blood work) drawn today and your tests are completely normal, you will receive your results only by: MyChart Message (if you have MyChart) OR A paper copy in the mail If you have any lab test that is abnormal or we need to change your treatment, we will call you to review the results.  Testing/Procedures: Your physician has requested that you have an echocardiogram at next available appointment slot. Echocardiography is a painless test that uses sound waves to create images of your heart. It provides your doctor with information about the size and shape of your heart and how well your heart's chambers and valves are working. This procedure takes approximately one hour. There are no restrictions for this procedure. Please do NOT wear cologne, perfume, aftershave, or lotions (deodorant is allowed). Please arrive 15 minutes prior to your appointment time.  Please note: We ask at that you not bring children with you during ultrasound (echo/ vascular) testing. Due to room size and safety concerns, children are not allowed in the ultrasound rooms during exams. Our front office staff cannot provide observation of children in our lobby area while testing is being conducted. An adult accompanying a patient to their appointment will only be allowed in the ultrasound room at the discretion of the ultrasound technician under special circumstances. We apologize for any inconvenience.   Follow-Up: At [facility], you and your health needs are our priority.  As part of our continuing mission to provide you with exceptional heart care, our providers are all part of one team.  This team includes your primary Cardiologist (physician) and Advanced  Practice Providers or APPs (Physician Assistants and Nurse Practitioners) who all work together to provide you with the care you need, when you need it.  Your next appointment:   3 month(s)  Provider:   Jerel Balding, MD

## 2024-06-09 NOTE — Progress Notes (Signed)
 Cardiology Office Note:  .   Date:  06/09/2024  ID:  [name], DOB 1945/12/16, MRN [id number] PCP:  Rexanne Ingle, MD  [facility] Providers Cardiologist:  Jerel Balding, MD  Electrophysiologist:  None  Click to update primary MD,subspecialty MD or APP then REFRESH:1}    Chief Complaint  Patient presents with   Follow-up    DOD visit. Chest pain evaluation    History of Present Illness: .   [name] is a 78 y.o. Caucasian male whose past medical history and cardiovascular risk factors includes: CAD (multiple PCI before CABG 2007; SVG-OM1-OM2 and SVG-PLA), ischemic cardiomyopathy, chronic systolic and diastolic heart failure, complete heart block, frequent sustained paroxysmal atrial tachycardia and a biventricular pacemaker (new generator August 04, 2022 Medtronic Percepta CRT-P, original leads 2015). He also has advanced chronic obstructive lung disease related to long-standing previous smoking, type 2 diabetes mellitus on oral antidiabetics, small AAA (3 cm by US  09/2017), moderate bilateral PAD of lower extremities .  Patient underwent left heart catheterization on 05/09/2024 and underwent percutaneous revascularization of 99% stenosis in SVG to OM1/OM 2 with placement of DES.  He was seen by his primary cardiologist on 05/25/2024.  Since revascularization his symptoms of angina had resolved isosorbide  mononitrate had been discontinued.  Since his last office visit on 05/25/2024 he reached out to the triage line due to symptoms of chest pain.  This encounter was reviewed by the DOD in the interim and was recommended to restart Imdur  and he was scheduled to see me in person today for reevaluation.    I am seeing him today as a DOD for an acute visit for reevaluation of chest pain.  Reviewed the last progress note from Dr. Balding from 05/25/2024.   Patient states that this past Saturday, 06/04/2024, he was working on his brakes for the truck with exertion was experiencing  chest discomfort and difficulty breathing.  He vomited 3 times and felt better.  Pain started to ease off.  But given his cardiovascular history he reached out to the office for further guidance.  After restarting isosorbide  mononitrate he has not had any reoccurrence of chest pain.  Clinically he remains euvolemic.  Review of Systems: .   Review of Systems  Cardiovascular:  Negative for chest pain, claudication, irregular heartbeat, leg swelling, near-syncope, orthopnea, palpitations, paroxysmal nocturnal dyspnea and syncope.  Respiratory:  Negative for shortness of breath.   Hematologic/Lymphatic: Negative for bleeding problem.    Studies Reviewed:   EKG: EKG Interpretation Date/Time:  Friday June 09 2024 13:07:23 EDT Ventricular Rate:  73 PR Interval:  158 QRS Duration:  156 QT Interval:  446 QTC Calculation: 491 R Axis:   -41  Text Interpretation: Normal sinus rhythm atrial-sensed ventricular-paced complexes Left axis deviation Right bundle branch block Left ventricular hypertrophy with repolarization abnormality ( R in aVL , Sokolow-Lyon , Romhilt-Estes ) Cannot rule out Anterior infarct (cited on or before 09-Jun-2024) When compared with ECG of 25-May-2024 11:16, No significant change was found Confirmed by Michele Richardson [phone]) on 06/09/2024 1:30:41 PM  Echocardiogram: 08/2022  1. Left ventricular ejection fraction, by estimation, is 55 to 60%. The  left ventricle has normal function. The left ventricle has no regional  wall motion abnormalities. Left ventricular diastolic parameters are  consistent with Grade I diastolic  dysfunction (impaired relaxation). The average left ventricular global  longitudinal strain is -16.5 %. The global longitudinal strain is  abnormal.   2. Right ventricular systolic function is normal.  The right ventricular  size is normal.   3. The mitral valve is normal in structure. No evidence of mitral valve  regurgitation. No evidence of mitral  stenosis.   4. The aortic valve is tricuspid. Aortic valve regurgitation is not  visualized. No aortic stenosis is present.   5. The inferior vena cava is normal in size with greater than 50%  respiratory variability, suggesting right atrial pressure of 3 mmHg.   Cardiac Catheterization 05/09/24: Hemodynamic data: LVEDP 12 mmHg.  There is no pressure gradient across aortic valve.   Angiographic data: LM: Large-caliber vessel, smooth and normal. LAD: Is a large-caliber vessel with mild disease. LCx: Is a large-caliber vessel and a dominant vessel.  Gives origin to large OM1 and OM 2 which are occluded in the proximal segment at the site of prior stents.  Distal CX is large giving origin to PDA branches.  Mid segment of the CX has a 60% stenosis in some views appears to be 70%. RCA: Nondominant.  Distal RCA has a 90% focal stenosis. SVG to OM1 and OM 2: Widely patent in the proximal segment however at the insertion of OM1 there is a high-grade 99% stenosis.   Intervention data: Successful PTCA and stenting of the SVG to OM1-OM 2 placing the stent into the distal SVG and OM1 branch, a 3.5 x 16 mm Synergy XD DES deployed at 20 atmospheric pressure.  Stenosis reduced from 99% to 0% with TIMI-3 to TIMI-3 flow.   RFR to mid CX 60 to 70% stenosis: 0.99, not hemodynamically significant.  Lesion left alone.      Impression and recommendations: Continue DAPT for 6 months followed by probably Plavix  alone.  Will be discharged home today with outpatient follow-up.  RADIOLOGY: NA  Risk Assessment/Calculations:   NA   Labs:       Latest Ref Rng & Units 05/03/2024    4:26 PM 03/04/2024    4:34 PM 07/16/2022    2:35 PM  CBC  WBC 3.4 - 10.8 x10E3/uL 8.0  12.0  9.1   Hemoglobin 13.0 - 17.7 g/dL 84.3  84.7  86.6   Hematocrit 37.5 - 51.0 % 48.9  44.7  39.9   Platelets 150 - 450 x10E3/uL 341  324  309        Latest Ref Rng & Units 05/03/2024    4:26 PM 03/04/2024    4:34 PM 07/16/2022    2:35 PM   BMP  Glucose 70 - 99 mg/dL 868  759  769   BUN 8 - 27 mg/dL 20  27  15    Creatinine 0.76 - 1.27 mg/dL 9.18  8.77  9.25   BUN/Creat Ratio 10 - 24 25   20    Sodium 134 - 144 mmol/L 142  140  138   Potassium 3.5 - 5.2 mmol/L 4.7  4.8  4.5   Chloride 96 - 106 mmol/L 99  99  100   CO2 20 - 29 mmol/L 19  24  29    Calcium  8.6 - 10.2 mg/dL 89.7  9.9  9.5       Latest Ref Rng & Units 05/03/2024    4:26 PM 03/04/2024    4:34 PM 07/16/2022    2:35 PM  CMP  Glucose 70 - 99 mg/dL 868  759  769   BUN 8 - 27 mg/dL 20  27  15    Creatinine 0.76 - 1.27 mg/dL 9.18  8.77  9.25   Sodium 134 - 144 mmol/L  142  140  138   Potassium 3.5 - 5.2 mmol/L 4.7  4.8  4.5   Chloride 96 - 106 mmol/L 99  99  100   CO2 20 - 29 mmol/L 19  24  29    Calcium  8.6 - 10.2 mg/dL 89.7  9.9  9.5     Lab Results  Component Value Date   CHOL 135 10/16/2011   HDL 34 (L) 10/16/2011   LDLCALC 64 10/16/2011   TRIG 187 (H) 10/16/2011   CHOLHDL 4.0 10/16/2011   No results for input(s): LIPOA in the last 8760 hours. No components found for: NTPROBNP No results for input(s): PROBNP in the last 8760 hours. No results for input(s): TSH in the last 8760 hours.  Physical Exam:    Today's Vitals   06/09/24 1304  BP: 134/69  Pulse: 73  SpO2: 95%  Weight: 155 lb (70.3 kg)  Height: 5' 9.5 (1.765 m)   Body mass index is 22.56 kg/m. Wt Readings from Last 3 Encounters:  06/09/24 155 lb (70.3 kg)  05/25/24 153 lb (69.4 kg)  05/09/24 155 lb (70.3 kg)    Physical Exam  Constitutional: No distress.  hemodynamically stable  Neck: No JVD present.  Cardiovascular: Normal rate, regular rhythm, S1 normal and S2 normal. Exam reveals no gallop, no S3 and no S4.  No murmur heard. Pulmonary/Chest: Effort normal and breath sounds normal. No stridor. He has no wheezes. He has no rales.  Musculoskeletal:        General: No edema.     Cervical back: Neck supple.  Skin: Skin is warm.     Impression & Recommendation(s):   Impression:   ICD-10-CM   1. Coronary artery disease of bypass graft of native heart with stable angina pectoris (HCC)  I25.708 EKG 12-Lead    ECHOCARDIOGRAM COMPLETE    2. Chronic diastolic heart failure (HCC)  P49.67     3. Biventricular cardiac pacemaker in situ  Z95.0     4. Complete heart block (HCC)  I44.2     5. Mixed hyperlipidemia  E78.2        Recommendation(s):  Coronary artery disease of bypass graft of native heart with stable angina pectoris (HCC) With reinitiation of Imdur  he is not having any reoccurrence of chest pain. EKG is nonischemic, overall sensitivity reduced due to ventricularly paced rhythm. Compliant with dual antiplatelet therapy given his recent coronary intervention Antianginal therapy: Metoprolol , Imdur  Echo will be ordered to evaluate for structural heart disease and left ventricular systolic function. Recent heart catheterization results reviewed  Chronic diastolic heart failure (HCC) Biventricular cardiac pacemaker in situ Complete heart block (HCC) Stage C, NYHA class II Continue Jardiance  10 mg p.o. daily. Continue Lasix  40 mg p.o. as needed daily. Continue metoprolol  to tartrate 100 mg p.o. twice daily. As per the last progress note patient did not tolerate Entresto. Benazepril  was discontinued at the last office due to hypotension. Continue further workup with primary cardiology at follow-up visits  Mixed hyperlipidemia Continue rosuvastatin  10 mg p.o. nightly   Orders Placed:  Orders Placed This Encounter  Procedures   EKG 12-Lead   ECHOCARDIOGRAM COMPLETE    Standing Status:   Future    Expected Date:   06/16/2024    Where [facility] this [facility] performed[facility]    Does the patient weigh less than or greater than 250 lbs?:   Patient weighs less than 250 lbs    Perflutren DEFINITY (image enhancing agent)  should be administered unless hypersensitivity or allergy exist:   Administer Perflutren    Reason for  exam-Echo:   Other-Full Diagnosis List    Full ICD-10/Reason for Exam:   CAD (coronary artery disease) [761848]    Full ICD-10/Reason for Exam:   Chest pain [255200]     Final Medication List:   No orders of the defined types were placed in this encounter.   There are no discontinued medications.   Current Outpatient Medications:    aspirin  EC 81 MG tablet, Take 81 mg by mouth daily., Disp: , Rfl:    clopidogrel  (PLAVIX ) 75 MG tablet, TAKE 1 TABLET BY MOUTH  DAILY, Disp: 90 tablet, Rfl: 2   empagliflozin  (JARDIANCE ) 10 MG TABS tablet, Take 1 tablet (10 mg total) by mouth daily before breakfast., Disp: 30 tablet, Rfl: 11   furosemide  (LASIX ) 20 MG tablet, Take 2 tablets (40 mg total) by mouth daily as needed for fluid or edema (for shortness of  breath and swelling. Please take for weight greater than 166lb). MAY TAKE 40mg   DAILY AS NEEDED FOR SWELLING/SHORTNESS OF BREATH and WEIGHT OVER 166lbs, Disp: 30 tablet, Rfl: 3   glipiZIDE  (GLUCOTROL  XL) 5 MG 24 hr tablet, Take 5 mg by mouth in the morning and at bedtime., Disp: , Rfl:    isosorbide  mononitrate (IMDUR ) 60 MG 24 hr tablet, Take 60 mg by mouth daily., Disp: , Rfl:    metFORMIN  (GLUCOPHAGE ) 1000 MG tablet, Take 1 tablet (1,000 mg total) by mouth 2 (two) times daily with a meal. HOLD for 2 days. Restart 03/10/2014., Disp: , Rfl:    metoprolol  tartrate (LOPRESSOR ) 100 MG tablet, TAKE 1 TABLET BY MOUTH TWO  TIMES DAILY, Disp: 180 tablet, Rfl: 2   rosuvastatin  (CRESTOR ) 10 MG tablet, TAKE 1 TABLET BY MOUTH  DAILY, Disp: 90 tablet, Rfl: 3   nitroGLYCERIN  (NITROSTAT ) 0.4 MG SL tablet, Place 1 tablet (0.4 mg total) under the tongue every 5 (five) minutes as needed for chest pain. max 3 tablets per event. call 911 if no relief (Patient not taking: Reported on 06/09/2024), Disp: 25 tablet, Rfl: 6   pantoprazole  (PROTONIX ) 40 MG tablet, Take 40 mg by mouth daily as needed (acid reflux).  (Patient not taking: Reported on 06/09/2024), Disp: , Rfl:    Consent:   NA  Disposition:   Follow-up with primary cardiology as originally planned, sooner if needed  His questions and concerns were addressed to his satisfaction. He voices understanding of the recommendations provided during this encounter.    Signed, Madonna Michele HAS, [facility]  A Division of [facility] [place] [address], [place], [place] [address]  [place], [place] [address] 06/09/2024 7:49 PM

## 2024-06-23 ENCOUNTER — Ambulatory Visit (HOSPITAL_COMMUNITY)
Admission: RE | Admit: 2024-06-23 | Discharge: 2024-06-23 | Disposition: A | Discharge status: Home / Self Care | Source: Ambulatory Visit | Attending: Cardiology | Admitting: Cardiology

## 2024-06-23 DIAGNOSIS — I25708 Atherosclerosis of coronary artery bypass graft(s), unspecified, with other forms of angina pectoris: Secondary | ICD-10-CM | POA: Insufficient documentation

## 2024-06-24 LAB — ECHOCARDIOGRAM COMPLETE
Area-P 1/2: 2.55 cm2
S' Lateral: 2.9 cm

## 2024-06-28 ENCOUNTER — Other Ambulatory Visit: Payer: Self-pay | Admitting: Cardiovascular Disease

## 2024-07-01 ENCOUNTER — Ambulatory Visit: Payer: Self-pay | Admitting: Cardiology

## 2024-08-03 ENCOUNTER — Ambulatory Visit (INDEPENDENT_AMBULATORY_CARE_PROVIDER_SITE_OTHER)

## 2024-08-03 DIAGNOSIS — I5032 Chronic diastolic (congestive) heart failure: Secondary | ICD-10-CM | POA: Diagnosis not present

## 2024-08-04 LAB — CUP PACEART REMOTE DEVICE CHECK
Battery Remaining Longevity: 77 mo
Battery Voltage: 2.98 V
Brady Statistic AP VP Percent: 20.15 %
Brady Statistic AP VS Percent: 0 %
Brady Statistic AS VP Percent: 79.81 %
Brady Statistic AS VS Percent: 0.03 %
Brady Statistic RA Percent Paced: 19.76 %
Brady Statistic RV Percent Paced: 99.96 %
Date Time Interrogation Session: 20251009024037
Implantable Lead Connection Status: 753985
Implantable Lead Connection Status: 753985
Implantable Lead Connection Status: 753985
Implantable Lead Implant Date: 20130418
Implantable Lead Implant Date: 20130418
Implantable Lead Implant Date: 20150312
Implantable Lead Location: 753858
Implantable Lead Location: 753859
Implantable Lead Location: 753860
Implantable Pulse Generator Implant Date: 20231002
Lead Channel Impedance Value: 304 Ohm
Lead Channel Impedance Value: 3344 Ohm
Lead Channel Impedance Value: 3344 Ohm
Lead Channel Impedance Value: 3344 Ohm
Lead Channel Impedance Value: 399 Ohm
Lead Channel Impedance Value: 418 Ohm
Lead Channel Impedance Value: 456 Ohm
Lead Channel Impedance Value: 513 Ohm
Lead Channel Impedance Value: 589 Ohm
Lead Channel Pacing Threshold Amplitude: 0.75 V
Lead Channel Pacing Threshold Pulse Width: 0.4 ms
Lead Channel Sensing Intrinsic Amplitude: 1.875 mV
Lead Channel Sensing Intrinsic Amplitude: 1.875 mV
Lead Channel Sensing Intrinsic Amplitude: 24.625 mV
Lead Channel Sensing Intrinsic Amplitude: 24.625 mV
Lead Channel Setting Pacing Amplitude: 2 V
Lead Channel Setting Pacing Amplitude: 2 V
Lead Channel Setting Pacing Amplitude: 2.5 V
Lead Channel Setting Pacing Pulse Width: 0.4 ms
Lead Channel Setting Pacing Pulse Width: 0.8 ms
Lead Channel Setting Sensing Sensitivity: 5.6 mV
Zone Setting Status: 755011
Zone Setting Status: 755011

## 2024-08-04 NOTE — Progress Notes (Signed)
 Remote PPM Transmission

## 2024-08-08 NOTE — Progress Notes (Signed)
 Remote PPM Transmission

## 2024-08-09 ENCOUNTER — Ambulatory Visit: Payer: Self-pay | Admitting: Internal Medicine

## 2024-09-08 ENCOUNTER — Ambulatory Visit: Admitting: Cardiovascular Disease

## 2024-10-02 ENCOUNTER — Telehealth: Payer: Self-pay | Admitting: Pharmacy Technician

## 2024-10-02 NOTE — Telephone Encounter (Signed)
 Faxed jardiance application to [employer]

## 2024-10-05 NOTE — Telephone Encounter (Signed)
 Denied due to being medicare LIS eligible per [employer]   [place]     [place]: [employer] [place] [address] [place] [address] [phone] ext. [phone]. He needs to say: I need help applying for the low income subsidy/extra help program. He also should ask for [place], who is catering manager of the [employer] (said [employer]) program. He makes [phone] per form.   He will call and call me back

## 2024-11-01 ENCOUNTER — Telehealth: Payer: Self-pay | Admitting: Cardiovascular Disease

## 2024-11-01 NOTE — Telephone Encounter (Signed)
" °*  STAT* If patient is at the pharmacy, call can be transferred to refill team.   1. Which medications need to be refilled? (please list name of each medication and dose if known)   rosuvastatin  (CRESTOR ) 10 MG tablet    2. Which pharmacy/location (including street and city if local pharmacy) is medication to be sent to? [facility] - [place], [place] - [address] [place]   3. Do they need a 30 day or 90 day supply? 90   Patient is almost out of medication.  "

## 2024-11-02 ENCOUNTER — Ambulatory Visit

## 2024-11-02 ENCOUNTER — Telehealth: Payer: Self-pay | Admitting: Cardiovascular Disease

## 2024-11-02 ENCOUNTER — Other Ambulatory Visit: Payer: Self-pay

## 2024-11-02 DIAGNOSIS — I5032 Chronic diastolic (congestive) heart failure: Secondary | ICD-10-CM

## 2024-11-02 MED ORDER — ROSUVASTATIN CALCIUM 10 MG PO TABS: 10.0000 mg | ORAL_TABLET | Freq: Every day | ORAL | 0 refills | Status: AC

## 2024-11-02 NOTE — Telephone Encounter (Signed)
" °*  STAT* If patient is at the pharmacy, call can be transferred to refill team.   1. Which medications need to be refilled? (please list name of each medication and dose if known)   empagliflozin  (JARDIANCE ) 10 MG TABS tablet   2. Would you like to learn more about the convenience, safety, & potential cost savings by using the [facility]?   3. Are you open to using the [facility] (Type [facility]. ).  4. Which pharmacy/location (including street and city if local pharmacy) is medication to be sent to?  [facility] - [place], [place] - [address]   5. Do they need a 30 day or 90 day supply?  90 day  Patient stated he is almost out of this medication.  Patient wants to get a year's supply.  Patient has appointment scheduled on 2/7 with Dr. Francyne. "

## 2024-11-03 LAB — CUP PACEART REMOTE DEVICE CHECK
Battery Remaining Longevity: 75 mo
Battery Voltage: 2.98 V
Brady Statistic AP VP Percent: 18.06 %
Brady Statistic AP VS Percent: 0 %
Brady Statistic AS VP Percent: 81.84 %
Brady Statistic AS VS Percent: 0.1 %
Brady Statistic RA Percent Paced: 17.76 %
Brady Statistic RV Percent Paced: 99.9 %
Date Time Interrogation Session: 20260107220300
Implantable Lead Connection Status: 753985
Implantable Lead Connection Status: 753985
Implantable Lead Connection Status: 753985
Implantable Lead Implant Date: 20130418
Implantable Lead Implant Date: 20130418
Implantable Lead Implant Date: 20150312
Implantable Lead Location: 753858
Implantable Lead Location: 753859
Implantable Lead Location: 753860
Implantable Pulse Generator Implant Date: 20231002
Lead Channel Impedance Value: 323 Ohm
Lead Channel Impedance Value: 3344 Ohm
Lead Channel Impedance Value: 3344 Ohm
Lead Channel Impedance Value: 3344 Ohm
Lead Channel Impedance Value: 399 Ohm
Lead Channel Impedance Value: 437 Ohm
Lead Channel Impedance Value: 456 Ohm
Lead Channel Impedance Value: 551 Ohm
Lead Channel Impedance Value: 608 Ohm
Lead Channel Pacing Threshold Amplitude: 0.75 V
Lead Channel Pacing Threshold Pulse Width: 0.4 ms
Lead Channel Sensing Intrinsic Amplitude: 24.625 mV
Lead Channel Sensing Intrinsic Amplitude: 24.625 mV
Lead Channel Sensing Intrinsic Amplitude: 3.125 mV
Lead Channel Sensing Intrinsic Amplitude: 3.125 mV
Lead Channel Setting Pacing Amplitude: 2 V
Lead Channel Setting Pacing Amplitude: 2 V
Lead Channel Setting Pacing Amplitude: 2.5 V
Lead Channel Setting Pacing Pulse Width: 0.4 ms
Lead Channel Setting Pacing Pulse Width: 0.8 ms
Lead Channel Setting Sensing Sensitivity: 5.6 mV
Zone Setting Status: 755011
Zone Setting Status: 755011

## 2024-11-03 MED ORDER — EMPAGLIFLOZIN 10 MG PO TABS: 10.0000 mg | ORAL_TABLET | Freq: Every day | ORAL | 2 refills | Status: AC

## 2024-11-03 NOTE — Telephone Encounter (Signed)
 RX sent in.

## 2024-11-04 ENCOUNTER — Ambulatory Visit: Payer: Self-pay | Admitting: Cardiovascular Disease

## 2024-11-07 NOTE — Progress Notes (Signed)
 Remote PPM Transmission

## 2024-12-12 ENCOUNTER — Ambulatory Visit: Admitting: Cardiovascular Disease

## 2025-02-01 ENCOUNTER — Encounter

## 2025-05-03 ENCOUNTER — Encounter

## 2025-08-02 ENCOUNTER — Encounter
